# Patient Record
Sex: Female | Born: 1945
Health system: Southern US, Community
[De-identification: ages and names within clinical notes are randomized; demographics above are authoritative.]

## PROBLEM LIST (undated history)

## (undated) ENCOUNTER — Emergency Department (HOSPITAL_BASED_OUTPATIENT_CLINIC_OR_DEPARTMENT_OTHER): Admission: EM | Source: Home / Self Care

## (undated) DIAGNOSIS — I8290 Acute embolism and thrombosis of unspecified vein: Secondary | ICD-10-CM

## (undated) DIAGNOSIS — N189 Chronic kidney disease, unspecified: Secondary | ICD-10-CM

## (undated) DIAGNOSIS — I82409 Acute embolism and thrombosis of unspecified deep veins of unspecified lower extremity: Secondary | ICD-10-CM

## (undated) DIAGNOSIS — E785 Hyperlipidemia, unspecified: Secondary | ICD-10-CM

## (undated) DIAGNOSIS — M1990 Unspecified osteoarthritis, unspecified site: Secondary | ICD-10-CM

## (undated) DIAGNOSIS — D6851 Activated protein C resistance: Secondary | ICD-10-CM

## (undated) DIAGNOSIS — G43909 Migraine, unspecified, not intractable, without status migrainosus: Secondary | ICD-10-CM

## (undated) DIAGNOSIS — R51 Headache: Secondary | ICD-10-CM

## (undated) DIAGNOSIS — C50919 Malignant neoplasm of unspecified site of unspecified female breast: Secondary | ICD-10-CM

## (undated) DIAGNOSIS — K219 Gastro-esophageal reflux disease without esophagitis: Secondary | ICD-10-CM

## (undated) DIAGNOSIS — M810 Age-related osteoporosis without current pathological fracture: Secondary | ICD-10-CM

## (undated) DIAGNOSIS — C801 Malignant (primary) neoplasm, unspecified: Secondary | ICD-10-CM

## (undated) DIAGNOSIS — H53143 Visual discomfort, bilateral: Secondary | ICD-10-CM

## (undated) DIAGNOSIS — G4700 Insomnia, unspecified: Secondary | ICD-10-CM

## (undated) HISTORY — PX: EYE SURGERY: SHX253

## (undated) HISTORY — DX: Chronic kidney disease, unspecified: N18.9

## (undated) HISTORY — DX: Insomnia, unspecified: G47.00

## (undated) HISTORY — DX: Acute embolism and thrombosis of unspecified deep veins of unspecified lower extremity: I82.409

## (undated) HISTORY — DX: Activated protein C resistance: D68.51

## (undated) HISTORY — DX: Migraine, unspecified, not intractable, without status migrainosus: G43.909

## (undated) HISTORY — PX: SHOULDER SURGERY: SHX246

## (undated) HISTORY — PX: TUBAL LIGATION: SHX77

## (undated) HISTORY — PX: KNEE SURGERY: SHX244

## (undated) HISTORY — DX: Age-related osteoporosis without current pathological fracture: M81.0

## (undated) HISTORY — DX: Hyperlipidemia, unspecified: E78.5

## (undated) HISTORY — DX: Gastro-esophageal reflux disease without esophagitis: K21.9

## (undated) HISTORY — PX: TONSILLECTOMY: SUR1361

---

## 1985-07-28 HISTORY — PX: BREAST SURGERY: SHX581

## 1989-03-28 DIAGNOSIS — I82409 Acute embolism and thrombosis of unspecified deep veins of unspecified lower extremity: Secondary | ICD-10-CM

## 1989-03-28 HISTORY — DX: Acute embolism and thrombosis of unspecified deep veins of unspecified lower extremity: I82.409

## 1994-07-28 DIAGNOSIS — I8290 Acute embolism and thrombosis of unspecified vein: Secondary | ICD-10-CM

## 1994-07-28 HISTORY — DX: Acute embolism and thrombosis of unspecified vein: I82.90

## 1999-11-27 ENCOUNTER — Other Ambulatory Visit: Admission: RE | Admit: 1999-11-27 | Discharge: 1999-11-27 | Payer: Self-pay | Admitting: Gynecology

## 2000-11-27 ENCOUNTER — Other Ambulatory Visit: Admission: RE | Admit: 2000-11-27 | Discharge: 2000-11-27 | Payer: Self-pay | Admitting: Gynecology

## 2001-07-09 ENCOUNTER — Encounter: Admission: RE | Admit: 2001-07-09 | Discharge: 2001-07-09 | Payer: Self-pay | Admitting: Neurosurgery

## 2001-08-12 ENCOUNTER — Encounter: Admission: RE | Admit: 2001-08-12 | Discharge: 2001-11-10 | Payer: Self-pay

## 2001-12-08 ENCOUNTER — Other Ambulatory Visit: Admission: RE | Admit: 2001-12-08 | Discharge: 2001-12-08 | Payer: Self-pay | Admitting: Gynecology

## 2002-01-13 ENCOUNTER — Encounter: Admission: RE | Admit: 2002-01-13 | Discharge: 2002-04-13 | Payer: Self-pay

## 2002-07-28 DIAGNOSIS — C50919 Malignant neoplasm of unspecified site of unspecified female breast: Secondary | ICD-10-CM

## 2002-07-28 HISTORY — DX: Malignant neoplasm of unspecified site of unspecified female breast: C50.919

## 2002-07-28 HISTORY — PX: MASTECTOMY: SHX3

## 2002-12-16 ENCOUNTER — Other Ambulatory Visit: Admission: RE | Admit: 2002-12-16 | Discharge: 2002-12-16 | Payer: Self-pay | Admitting: Gynecology

## 2003-02-15 ENCOUNTER — Encounter: Admission: RE | Admit: 2003-02-15 | Discharge: 2003-02-15 | Payer: Self-pay | Admitting: General Surgery

## 2003-02-15 ENCOUNTER — Encounter: Payer: Self-pay | Admitting: General Surgery

## 2003-02-15 ENCOUNTER — Encounter (INDEPENDENT_AMBULATORY_CARE_PROVIDER_SITE_OTHER): Payer: Self-pay | Admitting: *Deleted

## 2003-02-17 ENCOUNTER — Encounter: Admission: RE | Admit: 2003-02-17 | Discharge: 2003-02-17 | Payer: Self-pay | Admitting: General Surgery

## 2003-02-17 ENCOUNTER — Encounter: Payer: Self-pay | Admitting: General Surgery

## 2003-02-21 ENCOUNTER — Encounter: Payer: Self-pay | Admitting: General Surgery

## 2003-02-21 ENCOUNTER — Ambulatory Visit (HOSPITAL_BASED_OUTPATIENT_CLINIC_OR_DEPARTMENT_OTHER): Admission: RE | Admit: 2003-02-21 | Discharge: 2003-02-22 | Payer: Self-pay | Admitting: General Surgery

## 2003-02-21 ENCOUNTER — Encounter: Admission: RE | Admit: 2003-02-21 | Discharge: 2003-02-21 | Payer: Self-pay | Admitting: General Surgery

## 2003-02-21 ENCOUNTER — Encounter (INDEPENDENT_AMBULATORY_CARE_PROVIDER_SITE_OTHER): Payer: Self-pay

## 2003-02-21 HISTORY — PX: BREAST LUMPECTOMY: SHX2

## 2003-02-21 HISTORY — PX: SENTINEL LYMPH NODE BIOPSY: SHX2392

## 2003-03-02 ENCOUNTER — Ambulatory Visit: Admission: RE | Admit: 2003-03-02 | Discharge: 2003-05-31 | Payer: Self-pay | Admitting: Radiation Oncology

## 2003-06-26 ENCOUNTER — Ambulatory Visit: Admission: RE | Admit: 2003-06-26 | Discharge: 2003-06-26 | Payer: Self-pay | Admitting: Radiation Oncology

## 2003-11-27 ENCOUNTER — Ambulatory Visit: Admission: RE | Admit: 2003-11-27 | Discharge: 2003-11-27 | Payer: Self-pay | Admitting: Radiation Oncology

## 2003-11-28 ENCOUNTER — Ambulatory Visit: Admission: RE | Admit: 2003-11-28 | Discharge: 2003-11-28 | Payer: Self-pay | Admitting: Radiation Oncology

## 2003-12-05 ENCOUNTER — Ambulatory Visit: Admission: RE | Admit: 2003-12-05 | Discharge: 2003-12-05 | Payer: Self-pay | Admitting: Radiation Oncology

## 2004-02-05 ENCOUNTER — Other Ambulatory Visit: Admission: RE | Admit: 2004-02-05 | Discharge: 2004-02-05 | Payer: Self-pay | Admitting: Gynecology

## 2004-06-11 ENCOUNTER — Ambulatory Visit: Admission: RE | Admit: 2004-06-11 | Discharge: 2004-06-11 | Payer: Self-pay | Admitting: Radiation Oncology

## 2004-06-24 ENCOUNTER — Ambulatory Visit: Admission: RE | Admit: 2004-06-24 | Discharge: 2004-06-24 | Payer: Self-pay | Admitting: Radiation Oncology

## 2004-11-22 ENCOUNTER — Ambulatory Visit: Payer: Self-pay | Admitting: Oncology

## 2005-02-24 ENCOUNTER — Other Ambulatory Visit: Admission: RE | Admit: 2005-02-24 | Discharge: 2005-02-24 | Payer: Self-pay | Admitting: Gynecology

## 2005-05-16 ENCOUNTER — Ambulatory Visit: Payer: Self-pay | Admitting: Oncology

## 2005-10-24 ENCOUNTER — Ambulatory Visit: Payer: Self-pay | Admitting: Oncology

## 2005-11-03 LAB — COMPREHENSIVE METABOLIC PANEL
BUN: 12 mg/dL (ref 6–23)
CO2: 29 mEq/L (ref 19–32)
Creatinine, Ser: 0.8 mg/dL (ref 0.4–1.2)
Glucose, Bld: 93 mg/dL (ref 70–99)
Total Bilirubin: 0.4 mg/dL (ref 0.3–1.2)

## 2005-11-03 LAB — CBC WITH DIFFERENTIAL/PLATELET
Eosinophils Absolute: 0.2 10*3/uL (ref 0.0–0.5)
HCT: 36.1 % (ref 34.8–46.6)
LYMPH%: 27 % (ref 14.0–48.0)
MCV: 95.3 fL (ref 81.0–101.0)
MONO#: 0.5 10*3/uL (ref 0.1–0.9)
MONO%: 6.7 % (ref 0.0–13.0)
NEUT#: 4.3 10*3/uL (ref 1.5–6.5)
NEUT%: 62.1 % (ref 39.6–76.8)
Platelets: 318 10*3/uL (ref 145–400)
WBC: 6.9 10*3/uL (ref 3.9–10.0)

## 2005-11-03 LAB — CANCER ANTIGEN 27.29: CA 27.29: 17 U/mL (ref 0–39)

## 2005-11-10 ENCOUNTER — Ambulatory Visit (HOSPITAL_COMMUNITY): Admission: RE | Admit: 2005-11-10 | Discharge: 2005-11-10 | Payer: Self-pay | Admitting: Oncology

## 2006-02-24 ENCOUNTER — Emergency Department (HOSPITAL_COMMUNITY): Admission: EM | Admit: 2006-02-24 | Discharge: 2006-02-24 | Payer: Self-pay | Admitting: Emergency Medicine

## 2006-03-23 ENCOUNTER — Other Ambulatory Visit: Admission: RE | Admit: 2006-03-23 | Discharge: 2006-03-23 | Payer: Self-pay | Admitting: Gynecology

## 2006-05-07 ENCOUNTER — Ambulatory Visit: Payer: Self-pay | Admitting: Oncology

## 2006-10-26 ENCOUNTER — Ambulatory Visit: Payer: Self-pay | Admitting: Oncology

## 2006-10-26 LAB — CBC WITH DIFFERENTIAL/PLATELET
BASO%: 0.4 % (ref 0.0–2.0)
Basophils Absolute: 0 10*3/uL (ref 0.0–0.1)
Eosinophils Absolute: 0.2 10*3/uL (ref 0.0–0.5)
HCT: 36.5 % (ref 34.8–46.6)
HGB: 12.6 g/dL (ref 11.6–15.9)
MCHC: 34.6 g/dL (ref 32.0–36.0)
MONO#: 0.6 10*3/uL (ref 0.1–0.9)
NEUT#: 5.3 10*3/uL (ref 1.5–6.5)
NEUT%: 66 % (ref 39.6–76.8)
WBC: 8.1 10*3/uL (ref 3.9–10.0)
lymph#: 1.9 10*3/uL (ref 0.9–3.3)

## 2006-10-26 LAB — COMPREHENSIVE METABOLIC PANEL
ALT: 21 U/L (ref 0–35)
CO2: 27 mEq/L (ref 19–32)
Calcium: 9.4 mg/dL (ref 8.4–10.5)
Chloride: 104 mEq/L (ref 96–112)
Creatinine, Ser: 0.88 mg/dL (ref 0.40–1.20)

## 2006-10-26 LAB — CANCER ANTIGEN 27.29: CA 27.29: 19 U/mL (ref 0–39)

## 2006-10-26 LAB — LACTATE DEHYDROGENASE: LDH: 202 U/L (ref 94–250)

## 2006-12-07 ENCOUNTER — Encounter: Admission: RE | Admit: 2006-12-07 | Discharge: 2006-12-07 | Payer: Self-pay | Admitting: Oncology

## 2007-05-06 ENCOUNTER — Ambulatory Visit: Payer: Self-pay | Admitting: Oncology

## 2007-05-10 LAB — CBC WITH DIFFERENTIAL/PLATELET
BASO%: 0.5 % (ref 0.0–2.0)
EOS%: 2.9 % (ref 0.0–7.0)
HCT: 37.3 % (ref 34.8–46.6)
MCH: 31.2 pg (ref 26.0–34.0)
MCHC: 34.9 g/dL (ref 32.0–36.0)
MONO#: 0.6 10*3/uL (ref 0.1–0.9)
NEUT%: 66.6 % (ref 39.6–76.8)
RBC: 4.17 10*6/uL (ref 3.70–5.32)
RDW: 13.5 % (ref 11.3–14.5)
WBC: 7.4 10*3/uL (ref 3.9–10.0)
lymph#: 1.7 10*3/uL (ref 0.9–3.3)

## 2007-05-10 LAB — COMPREHENSIVE METABOLIC PANEL
ALT: 20 U/L (ref 0–35)
AST: 22 U/L (ref 0–37)
Albumin: 4.7 g/dL (ref 3.5–5.2)
CO2: 24 mEq/L (ref 19–32)
Calcium: 9.3 mg/dL (ref 8.4–10.5)
Chloride: 104 mEq/L (ref 96–112)
Potassium: 4.1 mEq/L (ref 3.5–5.3)
Sodium: 141 mEq/L (ref 135–145)
Total Protein: 7.2 g/dL (ref 6.0–8.3)

## 2007-05-10 LAB — CANCER ANTIGEN 27.29: CA 27.29: 18 U/mL (ref 0–39)

## 2007-08-04 IMAGING — CT CT HEAD WO/W CM
1 of 2 series · 13 of 30 positions shown, 17 images · IV contrast (omnipaque)
Comparison: none

CLINICAL DATA: 59-year-old female with breast cancer and headache.
 HEAD CT WITHOUT AND WITH CONTRAST:
TECHNIQUE: Contiguous axial images were obtained from the base of the skull through the vertex according to standard protocol before and after administration of intravenous contrast.
 Contrast:  100 ml Omnipaque 300.

[Series 2: head_seq 4.5 h42s st · axial · 0.43mm/px · z∈[+1334,+1451]mm · 13 of 32 slices shown, 17 images]
[im 3/32  brain]
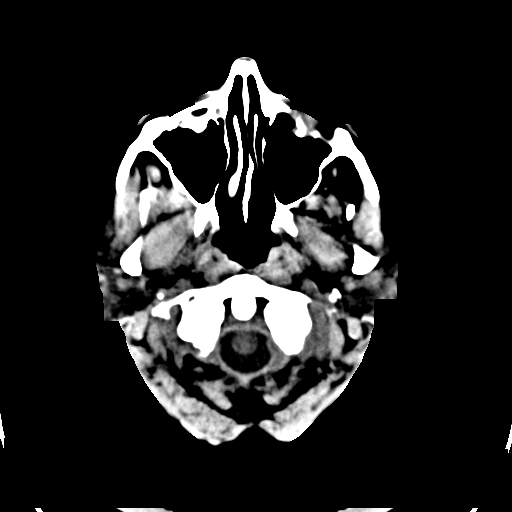
[im 3/32  bone]
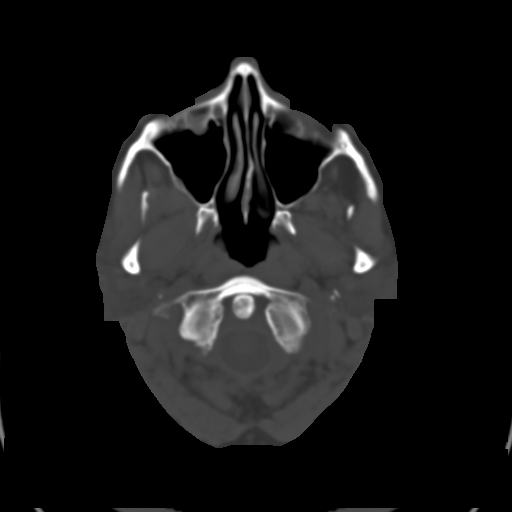
[im 5/32  brain]
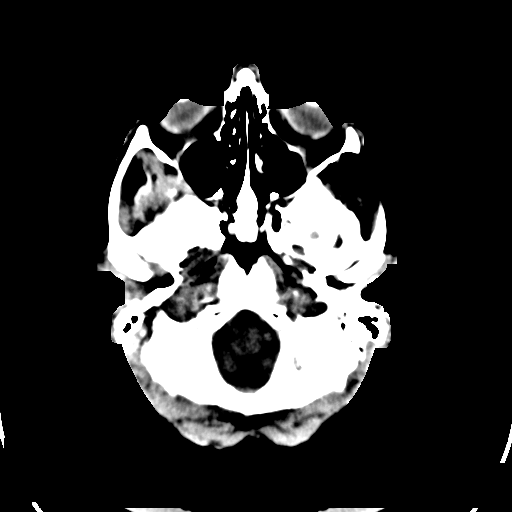
[im 7/32  brain]
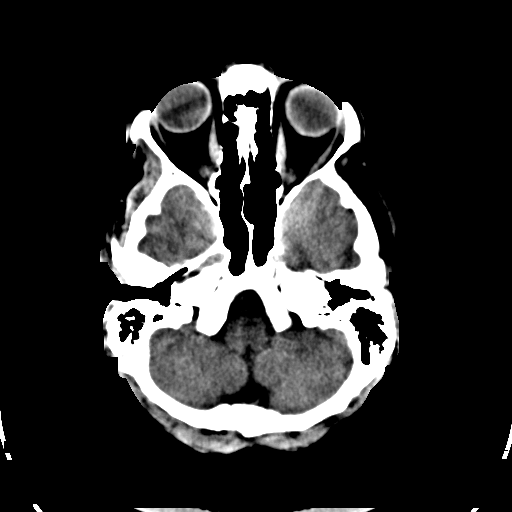
[im 9/32  brain]
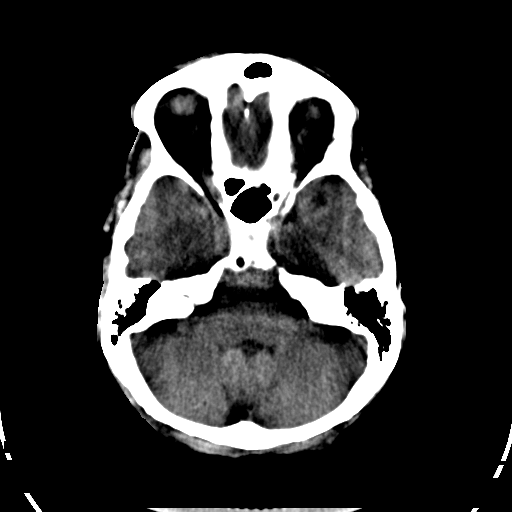
[im 12/32  brain]
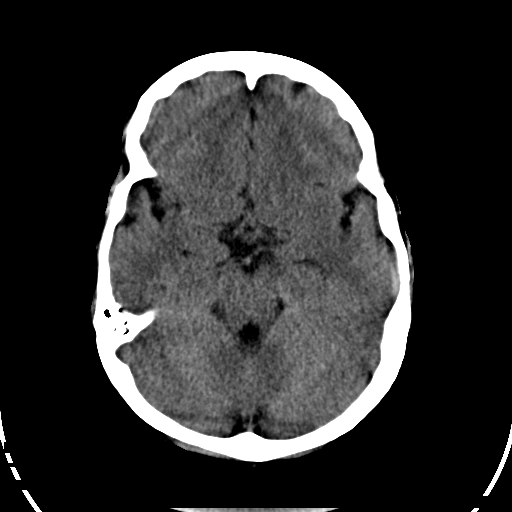
[im 12/32  bone]
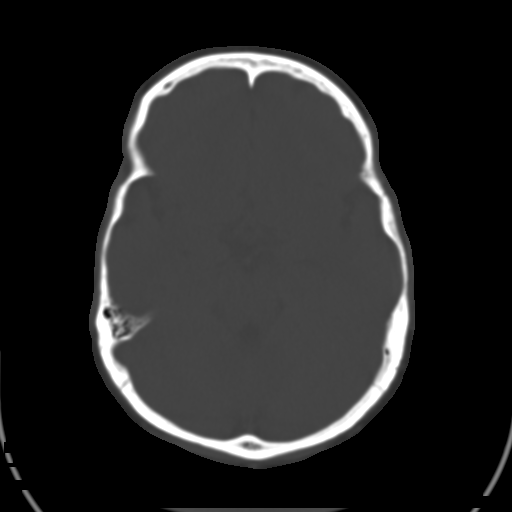
[im 14/32  brain]
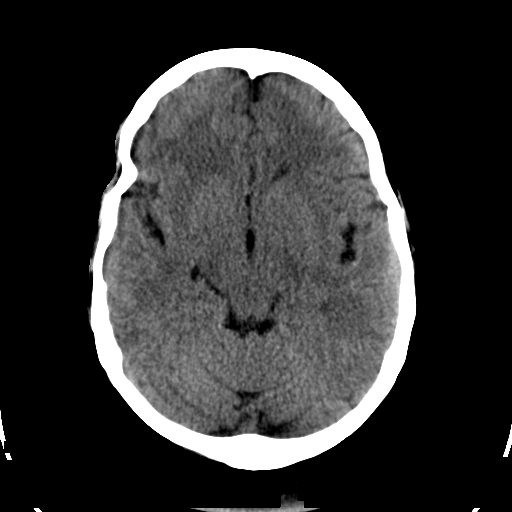
[im 16/32  brain]
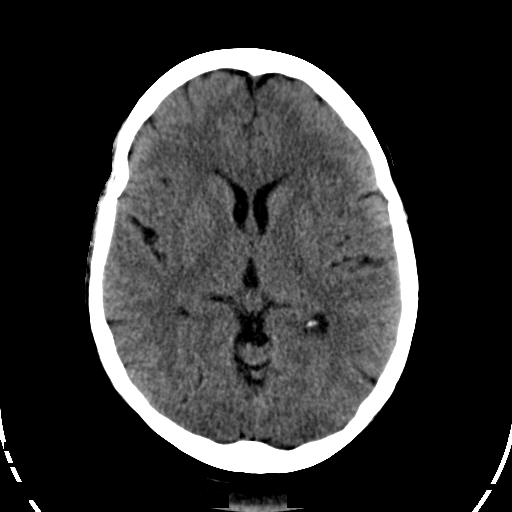
[im 18/32  brain]
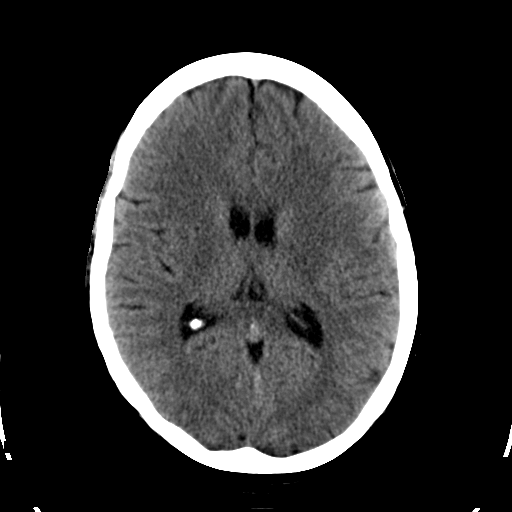
[im 20/32  brain]
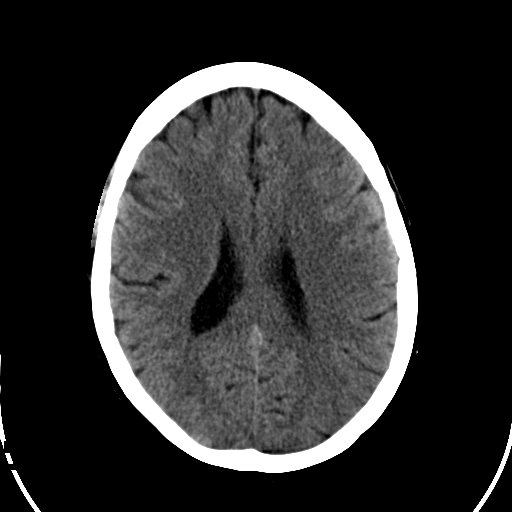
[im 20/32  bone]
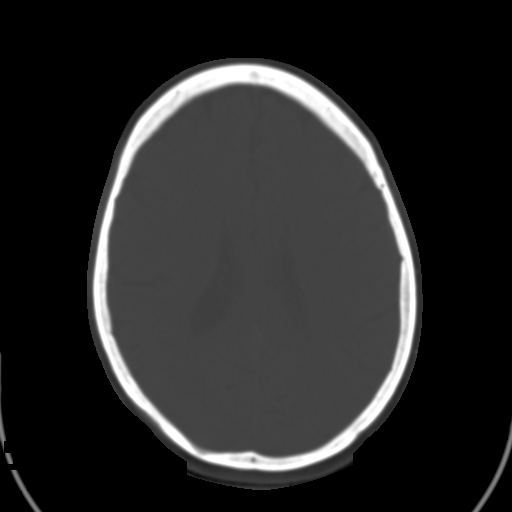
[im 23/32  brain]
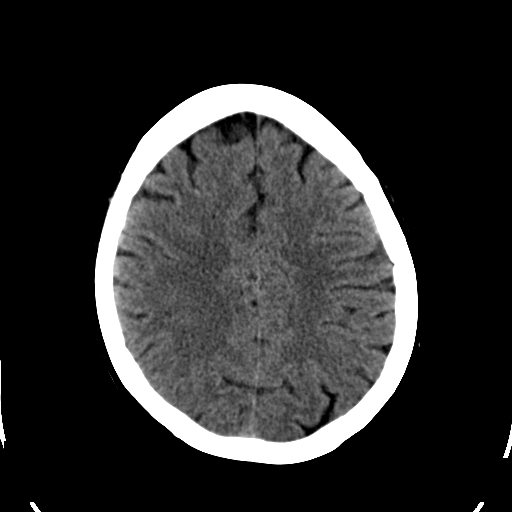
[im 25/32  brain]
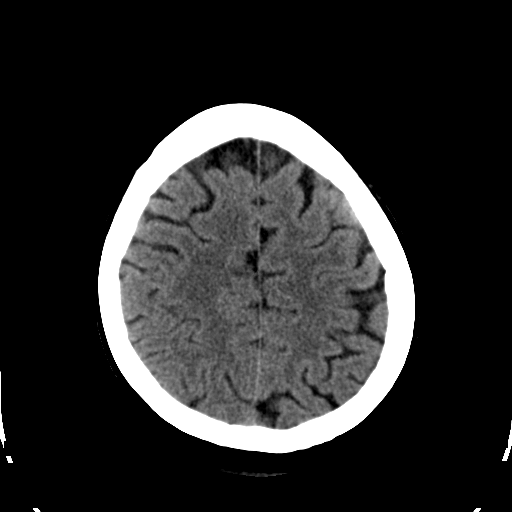
[im 27/32  brain]
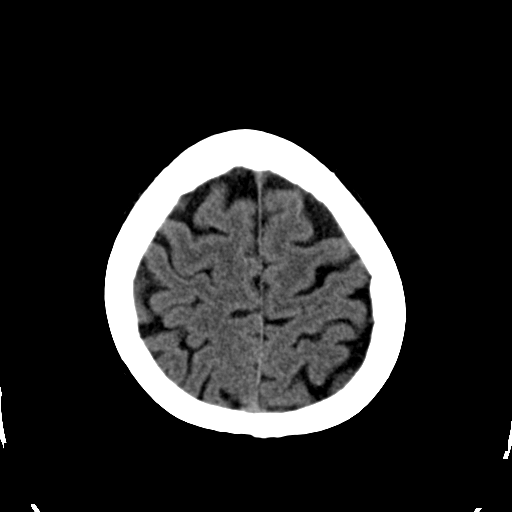
[im 29/32  brain]
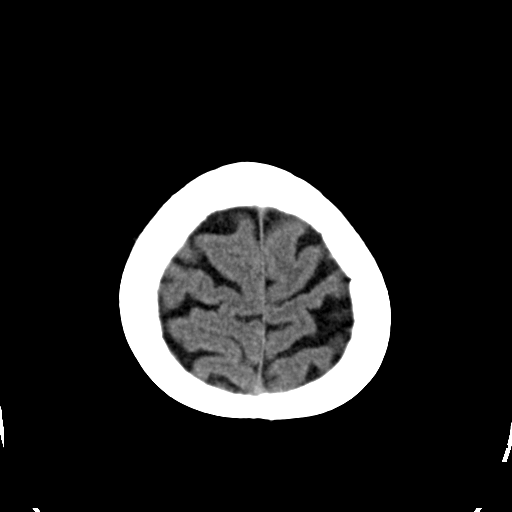
[im 29/32  bone]
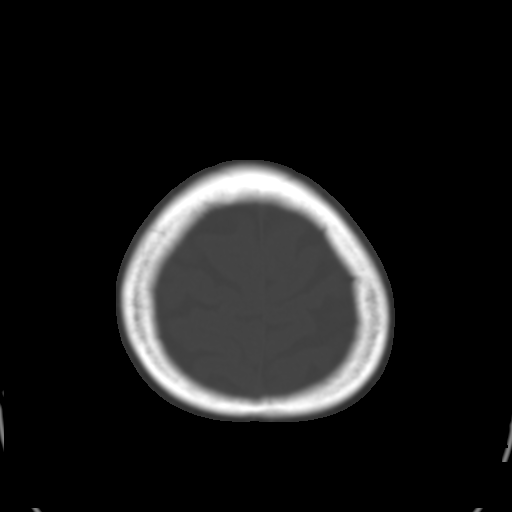

[13 of 30 positions shown; findings below may reference images not displayed]

FINDINGS: No acute intracranial abnormality is present.  Specifically, there is no evidence for acute infarct, hemorrhage, mass, hydrocephalus, or extra-axial fluid collection.  Postcontrast images demonstrate no areas of pathologic enhancement.
 A polyp or mucous retention cyst is noted within the right maxillary sinus measuring 5 mm.  The remainder of the paranasal sinuses and mastoid air cells are clear.  The osseous skull is intact.
IMPRESSION: 1.  No acute intracranial abnormality.
 2.  No evidence for metastatic disease to the brain.  If clinical concern persists, MRI of the brain without and with contrast would be more sensitive and specific for evaluation of metastatic disease.

## 2007-10-26 ENCOUNTER — Ambulatory Visit (HOSPITAL_BASED_OUTPATIENT_CLINIC_OR_DEPARTMENT_OTHER): Admission: RE | Admit: 2007-10-26 | Discharge: 2007-10-26 | Payer: Self-pay | Admitting: Orthopaedic Surgery

## 2007-11-03 ENCOUNTER — Ambulatory Visit: Payer: Self-pay | Admitting: Oncology

## 2007-11-08 LAB — CBC WITH DIFFERENTIAL/PLATELET
Eosinophils Absolute: 0.3 10*3/uL (ref 0.0–0.5)
LYMPH%: 21.7 % (ref 14.0–48.0)
MCV: 91.4 fL (ref 81.0–101.0)
MONO%: 6.9 % (ref 0.0–13.0)
NEUT#: 7 10*3/uL — ABNORMAL HIGH (ref 1.5–6.5)
Platelets: 288 10*3/uL (ref 145–400)
RBC: 3.96 10*6/uL (ref 3.70–5.32)

## 2007-11-08 LAB — COMPREHENSIVE METABOLIC PANEL
Alkaline Phosphatase: 57 U/L (ref 39–117)
BUN: 13 mg/dL (ref 6–23)
Glucose, Bld: 87 mg/dL (ref 70–99)
Sodium: 140 mEq/L (ref 135–145)
Total Bilirubin: 0.3 mg/dL (ref 0.3–1.2)
Total Protein: 7.2 g/dL (ref 6.0–8.3)

## 2007-11-08 LAB — CANCER ANTIGEN 27.29: CA 27.29: 18 U/mL (ref 0–39)

## 2008-05-04 ENCOUNTER — Ambulatory Visit: Payer: Self-pay | Admitting: Oncology

## 2008-05-08 LAB — CBC WITH DIFFERENTIAL/PLATELET
Eosinophils Absolute: 0.5 10*3/uL (ref 0.0–0.5)
MCV: 91.3 fL (ref 81.0–101.0)
MONO#: 0.6 10*3/uL (ref 0.1–0.9)
MONO%: 8.2 % (ref 0.0–13.0)
NEUT#: 4.2 10*3/uL (ref 1.5–6.5)
RBC: 4 10*6/uL (ref 3.70–5.32)
RDW: 14 % (ref 11.3–14.5)
WBC: 7.2 10*3/uL (ref 3.9–10.0)

## 2008-05-09 LAB — COMPREHENSIVE METABOLIC PANEL
ALT: 18 U/L (ref 0–35)
AST: 19 U/L (ref 0–37)
Albumin: 4.7 g/dL (ref 3.5–5.2)
Alkaline Phosphatase: 62 U/L (ref 39–117)
Glucose, Bld: 109 mg/dL — ABNORMAL HIGH (ref 70–99)
Potassium: 4.3 mEq/L (ref 3.5–5.3)
Sodium: 140 mEq/L (ref 135–145)
Total Protein: 7.1 g/dL (ref 6.0–8.3)

## 2008-05-09 LAB — LIPID PANEL
Cholesterol: 179 mg/dL (ref 0–200)
HDL: 55 mg/dL (ref >39)
Total CHOL/HDL Ratio: 3.3 Ratio
Triglycerides: 150 mg/dL — ABNORMAL HIGH (ref <150)

## 2008-05-09 LAB — VITAMIN D 25 HYDROXY (VIT D DEFICIENCY, FRACTURES): Vit D, 25-Hydroxy: 46 ng/mL (ref 30–89)

## 2008-11-13 ENCOUNTER — Ambulatory Visit: Payer: Self-pay | Admitting: Oncology

## 2008-11-15 LAB — COMPREHENSIVE METABOLIC PANEL
ALT: 14 U/L (ref 0–35)
AST: 19 U/L (ref 0–37)
Albumin: 4.5 g/dL (ref 3.5–5.2)
Calcium: 9.3 mg/dL (ref 8.4–10.5)
Chloride: 104 mEq/L (ref 96–112)
Potassium: 4.2 mEq/L (ref 3.5–5.3)
Sodium: 140 mEq/L (ref 135–145)
Total Protein: 6.9 g/dL (ref 6.0–8.3)

## 2008-11-15 LAB — CBC WITH DIFFERENTIAL/PLATELET
BASO%: 0.3 % (ref 0.0–2.0)
Basophils Absolute: 0 10*3/uL (ref 0.0–0.1)
EOS%: 4 % (ref 0.0–7.0)
HGB: 11.5 g/dL — ABNORMAL LOW (ref 11.6–15.9)
MCH: 29.1 pg (ref 25.1–34.0)
RDW: 14.6 % — ABNORMAL HIGH (ref 11.2–14.5)
lymph#: 1.9 10*3/uL (ref 0.9–3.3)

## 2008-11-15 LAB — LACTATE DEHYDROGENASE: LDH: 189 U/L (ref 94–250)

## 2009-05-28 ENCOUNTER — Ambulatory Visit: Payer: Self-pay | Admitting: Oncology

## 2009-05-30 LAB — CBC WITH DIFFERENTIAL/PLATELET
HGB: 11.7 g/dL (ref 11.6–15.9)
LYMPH%: 25 % (ref 14.0–49.7)
MCV: 89.7 fL (ref 79.5–101.0)
MONO%: 8.6 % (ref 0.0–14.0)
NEUT#: 5.1 10*3/uL (ref 1.5–6.5)
Platelets: 271 10*3/uL (ref 145–400)
RBC: 3.91 10*6/uL (ref 3.70–5.45)
RDW: 14.6 % — ABNORMAL HIGH (ref 11.2–14.5)
lymph#: 2.1 10*3/uL (ref 0.9–3.3)

## 2009-05-30 LAB — COMPREHENSIVE METABOLIC PANEL
Albumin: 3.9 g/dL (ref 3.5–5.2)
Alkaline Phosphatase: 71 U/L (ref 39–117)
BUN: 6 mg/dL (ref 6–23)
Glucose, Bld: 78 mg/dL (ref 70–99)
Potassium: 3.5 mEq/L (ref 3.5–5.3)
Total Bilirubin: 0.6 mg/dL (ref 0.3–1.2)

## 2009-05-31 LAB — VITAMIN D 25 HYDROXY (VIT D DEFICIENCY, FRACTURES): Vit D, 25-Hydroxy: 46 ng/mL (ref 30–89)

## 2009-05-31 LAB — CANCER ANTIGEN 27.29: CA 27.29: 16 U/mL (ref 0–39)

## 2010-03-28 HISTORY — PX: SHOULDER SURGERY: SHX246

## 2010-04-09 ENCOUNTER — Ambulatory Visit (HOSPITAL_BASED_OUTPATIENT_CLINIC_OR_DEPARTMENT_OTHER): Admission: RE | Admit: 2010-04-09 | Discharge: 2010-04-09 | Payer: Self-pay | Admitting: Orthopaedic Surgery

## 2010-05-23 ENCOUNTER — Ambulatory Visit: Payer: Self-pay | Admitting: Oncology

## 2010-05-27 LAB — CBC WITH DIFFERENTIAL/PLATELET
BASO%: 0.9 % (ref 0.0–2.0)
Basophils Absolute: 0.1 10*3/uL (ref 0.0–0.1)
Eosinophils Absolute: 0.3 10*3/uL (ref 0.0–0.5)
HCT: 35 % (ref 34.8–46.6)
LYMPH%: 25.3 % (ref 14.0–49.7)
MCH: 27.5 pg (ref 25.1–34.0)
MONO#: 0.6 10*3/uL (ref 0.1–0.9)
MONO%: 9.1 % (ref 0.0–14.0)
WBC: 6.7 10*3/uL (ref 3.9–10.3)
nRBC: 0 % (ref 0–0)

## 2010-05-27 LAB — COMPREHENSIVE METABOLIC PANEL
AST: 23 U/L (ref 0–37)
Albumin: 4.3 g/dL (ref 3.5–5.2)
Alkaline Phosphatase: 72 U/L (ref 39–117)
BUN: 14 mg/dL (ref 6–23)
Calcium: 9.5 mg/dL (ref 8.4–10.5)
Glucose, Bld: 101 mg/dL — ABNORMAL HIGH (ref 70–99)
Potassium: 4.4 mEq/L (ref 3.5–5.3)
Sodium: 141 mEq/L (ref 135–145)
Total Bilirubin: 0.3 mg/dL (ref 0.3–1.2)

## 2010-05-27 LAB — LIPID PANEL
Cholesterol: 150 mg/dL (ref 0–200)
HDL: 50 mg/dL (ref >39)
LDL Cholesterol: 87 mg/dL (ref 0–99)
Triglycerides: 63 mg/dL (ref <150)
VLDL: 13 mg/dL (ref 0–40)

## 2010-10-02 NOTE — H&P (Addendum)
  NAME:  Samantha Ali, Samantha Ali                 ACCOUNT NO.:  [id number]  MEDICAL RECORD NO.:  [id number]           PATIENT TYPE:  LOCATION:                                 FACILITY:  PHYSICIAN:  Lubertha Basque. Denelda Akerley, M.D.DATE OF BIRTH:  Dec 11, 1945  DATE OF ADMISSION:  04/09/2010 DATE OF DISCHARGE:                             HISTORY & PHYSICAL   CHIEF COMPLAINT:  Right shoulder pain.  HISTORY OF PRESENT ILLNESS:  This is a 65 year old female with a long- standing history right shoulder pain persistent despite oral anti- inflammatory medications and exercise.  Pain limits her ability at rest, keeps for awake at nighttime.  She does have impingement and a possible rotator cuff tear.  We have talked to her about the risks of arthroscopy and proceeding on with that.  No significant past medical history.  SOCIAL HISTORY:  She does not smoke, does not drink.  Hemoglobin 12.3.  EKG was done preoperatively which was within normal limits.  Family history is negative for diabetes, hypertension, heart disease.  Fourteen-point review of systems is positive for glasses, otherwise essentially negative.  PHYSICAL EXAMINATION:  Blood pressure 132/86, heart rate of 76.  Well developed, well nourished, in no apparent acute distress.  Lungs are clear.  Eyes, PERRLA.  Visual fields are normal.  Mouth, unobstructed. Full cervical motion. No carotid bruits noted.  Full upper extremity reflexes 2+ biceps, triceps, brachioradialis, but she has marked positive right shoulder impingement.  She has a painful arc and minimal pain at the [facility].  Good biceps and triceps strength with some discomfort with testing on the biceps musculature at the long head of the biceps.  Deltoid muscle appears normal.  Elbow motion full without sign of limitation.  Wrist unremarkable.  Skin intact.  No adenopathy noted.  Lower extremity examination is unremarkable.  Her abdomen has positive bowel sounds.  Negative guarding.  No  spleen or liver enlargement.  No masses or tenderness noted.  Lower extremity examination, good pulses, good neurovascular status.  No peripheral edema noted.  ASSESSMENT:  Right shoulder impingement, biceps degeneration, and rotator cuff tear.  PLAN:  At this point, she was planned on going to the operating room for the above-mentioned procedures for shoulder and then stable to be released home and then follow up in our office in 7-10 days.     Lindwood Qua, P.A.   ______________________________ Lubertha Basque. Jerl Santos, M.D.    MC/MEDQ  D:  09/19/2010  T:  09/20/2010  Job:  [id number]  Electronically Signed by Lindwood Qua P.A. on 09/20/2010 11:56:32 AM Electronically Signed by Marcene Corning M.D. on 10/01/2010 03:46:51 PM Electronically Signed by Marcene Corning M.D. on 10/01/2010 04:06:49 PM Electronically Signed by Marcene Corning M.D. on 10/01/2010 04:38:08 PM Electronically Signed by Marcene Corning M.D. on 10/01/2010 05:09:44 PM Electronically Signed by Marcene Corning M.D. on 10/01/2010 05:09:44 PM Electronically Signed by Marcene Corning M.D. on 10/01/2010 05:38:41 PM Electronically Signed by Marcene Corning M.D. on 10/01/2010 06:27:45 PM Electronically Signed by Marcene Corning M.D. on 10/01/2010 07:36:57 PM

## 2010-12-10 NOTE — Op Note (Signed)
NAME:  Samantha Ali, Samantha Ali                 ACCOUNT NO.:  [id number]   MEDICAL RECORD NO.:  [id number]          PATIENT TYPE:  AMB   LOCATION:  DSC                          FACILITY:  [facility]   PHYSICIAN:  Lubertha Basque. Dalldorf, M.D.DATE OF BIRTH:  05/22/1946   DATE OF PROCEDURE:  10/26/2007  DATE OF DISCHARGE:                               OPERATIVE REPORT   PREOPERATIVE DIAGNOSES:  1. Right knee torn lateral meniscus.  2. Right knee chondromalacia patella.   POSTOPERATIVE DIAGNOSES:  1. Right knee torn lateral meniscus.  2. Right knee chondromalacia patella.   PROCEDURES:  1. Right knee partial lateral meniscectomy.  2. Right knee abrasion chondroplasty and removal loose body.   ANESTHESIA:  Knee block, MAC.   ATTENDING SURGEON:  Lubertha Basque. Jerl Santos, M.D.   ASSISTANT:  Lindwood Qua, P.A.   INDICATIONS FOR PROCEDURE:  The patient is a 61-year woman with a long  history of right knee pain and swelling.  This has persisted despite  oral anti-inflammatories and an injection.  By MRI scan, she has a  lateral meniscus tear.  She has pain which limits her ability to rest  and walk and she is offered an arthroscopy.  Informed operative consent  was obtained after discussion of possible complications of reaction to  anesthesia and infection.   SUMMARY OF FINDINGS AND PROCEDURE:  Under knee block and MAC, a right  knee arthroscopy was performed.  Suprapatellar pouch was benign while  the patellofemoral joint exhibited focal degeneration in intertrochlear  groove addressed with a chondroplasty and abrasion to bleeding bone in  one tiny area.  Medial compartment exhibited no evidence of meniscal  injury though she did have some chondromalacia grade 3 near the  intertrochlear groove addressed with a brief chondroplasty.  ACL was  intact.  In the lateral compartment, she did have an anterior horn  lateral meniscus tear addressed with about 15 or 20% partial lateral  meniscectomy removing most of  the anterior horn.  The rest of meniscus  was intact.  There were some grade II and III changes in this  compartment but definitely no exposed bone.  She did have some loose  cartilaginous pieces in the knee which were removed and one small bony  piece which was removed.   DESCRIPTION OF PROCEDURE:  The patient was taken to the operating suite  where a knee block and MAC were applied.  She is positioned supine and  prepped and draped in normal sterile fashion.  After the administration  of preoperative IV Kefzol, an arthroscopy of the right knee was  performed through a total of two portals.  Findings were as noted above  and procedure consisted of the chondroplasty medial and patellofemoral  with a brief abrasion to bleeding bone in patellofemoral.  She did have  the loose bodies which were removed.  She did have the anterior horn  lateral meniscus tear addressed with a partial lateral meniscectomy  along with basket and shaver back to stable tissues.  The knee was  thoroughly irrigated followed by placing of Marcaine with epinephrine  and morphine.  Adaptic was placed over the portals followed by dry gauze  and loose Ace wrap.  Estimated blood loss and intraoperative fluids can  be obtained from anesthesia records.   DISPOSITION:  The patient was taken to recovery in stable addition.  She  was to go home the same day and follow up in the office in less than a  week.  I will contact her by phone tonight.      Lubertha Basque Jerl Santos, M.D.  Electronically Signed     PGD/MEDQ  D:  10/26/2007  T:  10/26/2007  Job:  [id number]

## 2010-12-13 NOTE — Consult Note (Signed)
NAME:  Samantha Ali, Samantha Ali                           ACCOUNT NO.:  [id number]   MEDICAL RECORD NO.:  [id number]                   PATIENT TYPE:  REC   LOCATION:  [facility]                                  FACILITY:  [facility]   PHYSICIAN:  Sondra Come, D.O.                 DATE OF BIRTH:  12/19/1945   DATE OF CONSULTATION:  DATE OF DISCHARGE:                  PHYSICAL MEDICINE & REHABILITATION CONSULTATION   HISTORY OF PRESENT ILLNESS:  The patient returns to clinic today for  referral.  She was last seen January 14, 2002.  In the interim, she has  continued to have lower back pain.  She denies any radicular symptoms at  this time.  She discontinued all of her medications a few weeks back  secondary to  rash that broke out on her big toe.  She felt that it might be  related in some way to the medications.  At this time she is on Zanaflex and  Bextra.  Her pain is rated as 5:10 on a subjective scale.  Also, in the  interim, she has tried the Ultracet samples which she states has helped her.  The rash that she has began prior to the initiation of Ultracet.   I reviewed Health and History form and 14-point review of systems.  Patient's function and quality of life indices remain stable.  Her sleep is  good.  She continues to use the neuromuscular stimulator but has had some  technical problems with it which the company is fixing.   PHYSICAL EXAMINATION:  GENERAL:  Reveals healthy female in no acute  distress.  VITALS:  Blood pressure 132/56, pulse 81, respirations 14 and regular.  BACK:  Reveals leveled pelvis without scoliosis.  There is decreased lumbar  lordosis with minimal tenderness to palpation of bilateral lumbar and  paraspinals.  Range of motion is essentially full with pain on extension.  Muscle testing is 5/5 in the bilateral lower extremities.  SENSORY:  Intact to light touch in bilateral lower extremities.  Muscle  stretch reflexes are 2+/4 in bilateral patellar, medial hamstrings and  left  Achilles, 1+/4 right Achille's.  Straight leg raise negative in a seated  position.   IMPRESSION:  1. Chronic lower back pain, mechanical and myofascial.  2. Degenerative disk disease of the lumbar spine without myelopathy.     Patient is not having any radicular component at this time.  3. Suspect lumbar facet arthropathy.   PLAN:  1. A long discussion with the patient regarding treatment options.  At this     point, we will continue with Ultracet one to two p.o. up to four times     daily as needed #100 with two refills.  2. Continue neuromuscular stimulator.  3. Continue home exercise program and consider using exercise ball.  4. Consider resuming Bextra at some point.  Patient is somewhat reluctant     now to do so given this rash on  her toe which is essentially healed.  I     am uncertain whether or not this represents a drug reaction.  5. Patient to return to clinic in six months or sooner as needed.   Patient was educated on above findings and recommendations and understands.  There were no barriers to communication.                                                Sondra Come, D.O.    JJW/MEDQ  D:  03/11/2002  T:  03/12/2002  Job:  [id number]

## 2011-03-12 ENCOUNTER — Emergency Department (INDEPENDENT_AMBULATORY_CARE_PROVIDER_SITE_OTHER): Payer: Medicare Other

## 2011-03-12 ENCOUNTER — Encounter: Payer: Self-pay | Admitting: *Deleted

## 2011-03-12 ENCOUNTER — Emergency Department (HOSPITAL_BASED_OUTPATIENT_CLINIC_OR_DEPARTMENT_OTHER)
Admission: EM | Admit: 2011-03-12 | Discharge: 2011-03-12 | Disposition: A | Discharge status: Home / Self Care | Payer: Medicare Other | Attending: Emergency Medicine | Admitting: Emergency Medicine

## 2011-03-12 DIAGNOSIS — W1809XA Striking against other object with subsequent fall, initial encounter: Secondary | ICD-10-CM

## 2011-03-12 DIAGNOSIS — M4120 Other idiopathic scoliosis, site unspecified: Secondary | ICD-10-CM

## 2011-03-12 DIAGNOSIS — M1710 Unilateral primary osteoarthritis, unspecified knee: Secondary | ICD-10-CM

## 2011-03-12 DIAGNOSIS — M47812 Spondylosis without myelopathy or radiculopathy, cervical region: Secondary | ICD-10-CM

## 2011-03-12 DIAGNOSIS — K7689 Other specified diseases of liver: Secondary | ICD-10-CM

## 2011-03-12 DIAGNOSIS — M47817 Spondylosis without myelopathy or radiculopathy, lumbosacral region: Secondary | ICD-10-CM

## 2011-03-12 DIAGNOSIS — M545 Low back pain, unspecified: Secondary | ICD-10-CM | POA: Insufficient documentation

## 2011-03-12 DIAGNOSIS — M542 Cervicalgia: Secondary | ICD-10-CM

## 2011-03-12 DIAGNOSIS — IMO0002 Reserved for concepts with insufficient information to code with codable children: Secondary | ICD-10-CM

## 2011-03-12 DIAGNOSIS — Y9289 Other specified places as the place of occurrence of the external cause: Secondary | ICD-10-CM | POA: Insufficient documentation

## 2011-03-12 DIAGNOSIS — Z981 Arthrodesis status: Secondary | ICD-10-CM

## 2011-03-12 DIAGNOSIS — M25569 Pain in unspecified knee: Secondary | ICD-10-CM | POA: Insufficient documentation

## 2011-03-12 DIAGNOSIS — M549 Dorsalgia, unspecified: Secondary | ICD-10-CM

## 2011-03-12 DIAGNOSIS — M25469 Effusion, unspecified knee: Secondary | ICD-10-CM

## 2011-03-12 DIAGNOSIS — R079 Chest pain, unspecified: Secondary | ICD-10-CM

## 2011-03-12 DIAGNOSIS — S8391XA Sprain of unspecified site of right knee, initial encounter: Secondary | ICD-10-CM

## 2011-03-12 DIAGNOSIS — R109 Unspecified abdominal pain: Secondary | ICD-10-CM

## 2011-03-12 DIAGNOSIS — S139XXA Sprain of joints and ligaments of unspecified parts of neck, initial encounter: Secondary | ICD-10-CM | POA: Insufficient documentation

## 2011-03-12 DIAGNOSIS — M1120 Other chondrocalcinosis, unspecified site: Secondary | ICD-10-CM

## 2011-03-12 HISTORY — DX: Malignant (primary) neoplasm, unspecified: C80.1

## 2011-03-12 HISTORY — DX: Acute embolism and thrombosis of unspecified vein: I82.90

## 2011-03-12 HISTORY — DX: Malignant neoplasm of unspecified site of unspecified female breast: C50.919

## 2011-03-12 LAB — BASIC METABOLIC PANEL
CO2: 26 mEq/L (ref 19–32)
Chloride: 102 mEq/L (ref 96–112)
GFR calc non Af Amer: 56 mL/min — ABNORMAL LOW (ref >60)

## 2011-03-12 MED ORDER — HYDROCODONE-ACETAMINOPHEN 5-325 MG PO TABS: 1.0000 | ORAL_TABLET | ORAL | Status: AC | PRN

## 2011-03-12 MED ORDER — IOHEXOL 300 MG/ML  SOLN
100.0000 mL | Freq: Once | INTRAMUSCULAR | Status: AC | PRN
  Administered 2011-03-12: 100 mL via INTRAVENOUS

## 2011-03-12 NOTE — ED Notes (Signed)
Assisted pt onto and off of bedpan. Pt voided apprx. or yellow urine. Specimen collected and delivered to lab.

## 2011-03-12 NOTE — ED Provider Notes (Signed)
History     CSN: [id number] Arrival date & time: 03/12/2011  4:53 PM  Chief Complaint  Patient presents with  . Fall   HPI Pt was at work, tried to sit down but missed chair and fell, landing on buttocks and hitting left side on corner of chair.  Did not hit head. C/o pain in low back and right knee.  Attempted to ambulate but aggravated knee and back pain.  Denies SOB.    Past Medical History  Diagnosis Date  . Cancer   . Breast cancer   . Clot     Past Surgical History  Procedure Date  . Knee surgery   . Shoulder surgery     No family history on file.  History  Substance Use Topics  . Smoking status: Never Smoker   . Smokeless tobacco: Not on file  . Alcohol Use: No    OB History    Grav Para Term Preterm Abortions TAB SAB Ect Mult Living                  Review of Systems  All other systems reviewed and are negative.    Physical Exam  BP 160/70  Pulse 101  Temp(Src) 98.2 F (36.8 C) (Oral)  Resp 18  SpO2 97%  Physical Exam  Nursing note and vitals reviewed. Constitutional: She is oriented to person, place, and time. She appears well-developed and well-nourished. No distress.       Pt talking on her cell phone  HENT:  Head: Normocephalic and atraumatic.  Eyes:       Normal appearance  Neck:       C-collar  Cardiovascular: Normal rate and regular rhythm.   Pulmonary/Chest: Effort normal and breath sounds normal.       No ecchymosis or abrasion.  Tenderness of left lateral chest wall.  No pleuritic pain reported.   Abdominal: She exhibits no distension. There is no guarding.       No ecchymosis or abrasion.  Tenderness of LUQ and left side.   Musculoskeletal:       Tenderness at approx C6.  No thoracic or lumbar spine tenderness.  Pelvis stable.  Full ROM of left ankle/knee/hip w/out pain.  Nml right ankle and hip exam but right knee tender at medial patella and ROM limited by pain.  Bilateral NV intact.   Neurological: She is alert and oriented to  person, place, and time.  Skin: Skin is warm and dry. No rash noted.  Psychiatric: She has a normal mood and affect. Her behavior is normal.    ED Course  Procedures  MDM Pt had a mechanical fall onto buttocks today and c/o low back and right knee pain.  Exam significant for c-spine tenderness, right patellar tenderness w/ painful ROM and left lateral abd and chest tenderness.  She is in NAD.  She was most concerned about whether or not her blue tooth was turned off and has been on her cell phone. Trauma CT of abd as well as CXR, xray cervical/lumbar spine and right knee pending.  Pt declines anything for pain.   Xray c-spine inconclusive but other studies neg for acute pathology.  CT c-spine obtained and neg.  All results discussed w/ pt.  Discharged home w/ pain medication.  She has a knee brace and crutches at home and close relationship w/ Dr. Jerl Santos w/ [facility].  10:40 PM       Otilio Miu, [place] 03/12/11 2241

## 2011-03-12 NOTE — ED Notes (Signed)
EMS sts pt was sitting down and thought a chair was behind her. Pt fell backwards onto her buttocks and felt a pop in  Her lower back. EMS transported on LSB with c-collar in place.

## 2011-03-12 NOTE — ED Notes (Signed)
Pt was in the motion of sitting down onto a chair that was not there. Pt fell backwards onto the floor landing on her buttocks. Pt sts she felt a pop and is c/o pain in her lower back and right knee. Right knee has slight swelling.

## 2011-03-15 NOTE — ED Provider Notes (Signed)
History/physical exam/procedure(s) were performed by non-physician practitioner and as supervising physician I was immediately available for consultation/collaboration. I have reviewed all notes and am in agreement with care and plan.   Hilario Quarry, MD 03/15/11 [phone]

## 2011-05-09 ENCOUNTER — Encounter: Payer: Self-pay | Admitting: *Deleted

## 2011-05-13 ENCOUNTER — Ambulatory Visit (HOSPITAL_BASED_OUTPATIENT_CLINIC_OR_DEPARTMENT_OTHER)
Admission: RE | Admit: 2011-05-13 | Discharge: 2011-05-13 | Disposition: A | Discharge status: Home / Self Care | Payer: Medicare Other | Source: Ambulatory Visit | Attending: Orthopaedic Surgery | Admitting: Orthopaedic Surgery

## 2011-05-13 DIAGNOSIS — M23305 Other meniscus derangements, unspecified medial meniscus, unspecified knee: Secondary | ICD-10-CM | POA: Insufficient documentation

## 2011-05-13 DIAGNOSIS — Z01812 Encounter for preprocedural laboratory examination: Secondary | ICD-10-CM | POA: Insufficient documentation

## 2011-05-13 DIAGNOSIS — Z853 Personal history of malignant neoplasm of breast: Secondary | ICD-10-CM | POA: Insufficient documentation

## 2011-05-13 DIAGNOSIS — M23302 Other meniscus derangements, unspecified lateral meniscus, unspecified knee: Secondary | ICD-10-CM | POA: Insufficient documentation

## 2011-05-13 DIAGNOSIS — M2240 Chondromalacia patellae, unspecified knee: Secondary | ICD-10-CM | POA: Insufficient documentation

## 2011-05-13 DIAGNOSIS — M2340 Loose body in knee, unspecified knee: Secondary | ICD-10-CM | POA: Insufficient documentation

## 2011-05-22 NOTE — Op Note (Signed)
NAME:  Samantha Ali, Samantha Ali                 ACCOUNT NO.:  [id number]  MEDICAL RECORD NO.:  [id number]  LOCATION:                                 FACILITY:  PHYSICIAN:  Lubertha Basque. Trevor Duty, M.D.DATE OF BIRTH:  November 23, 1945  DATE OF PROCEDURE:  05/13/2011 DATE OF DISCHARGE:                              OPERATIVE REPORT   PREOPERATIVE DIAGNOSES: 1. Right knee torn medial and torn lateral meniscus. 2. Right knee chondromalacia.  POSTOPERATIVE DIAGNOSES: 1. Right knee torn medial and torn lateral meniscus. 2. Right knee chondromalacia.  PROCEDURES: 1. Right knee partial medial and partial lateral meniscectomy. 2. Right knee abrasion chondroplasty, patellofemoral and lateral.  ATTENDING SURGEON:  Lubertha Basque. Kameran Lallier, MD   INDICATION FOR PROCEDURE:  The patient is a 64 year old woman with several months of recurrent right knee pain.  She had an arthroscopy and Supartz series for some moderate degenerative change 3 years back and did well until she fell out of a chair and twisted her knee.  She has persisted with difficulty, despite oral anti-inflammatories and bracing and some therapy.  By x-ray, she does have some moderate degenerative change, but it is not bone-on-bone and our hope is that we can ameliorate things somewhat with an arthroscopy.  Informed operative consent was obtained after discussion of possible complications including reaction to anesthesia and infection.  SUMMARY OF FINDINGS AND PROCEDURE:  Under general anesthesia, an arthroscopy of the right knee was performed.  The suprapatellar pouch was benign except for some small cartilaginous loose bodies, which were removed.  The patellofemoral joint exhibited grade 3 and focal grade 4 breakdown on the undersurface of the patella, and a chondroplasty was done along with abrasion to bleeding bone in some small areas.  The medial compartment was notable for a small middle horn medial meniscus tear addressed with a tiny partial  medial meniscectomy.  There were also some cartilaginous loose bodies here.  Arthritic change was grade 2 in this compartment.  ACL was intact.  Lateral compartment did exhibit some grade 4 change in a dime-sized area of the tibial plateau with some flaps of articular cartilage, which were debrided.  She also had a more significant lateral meniscus tear and about a 10% partial lateral meniscectomy was done with a basket and shaver.  DESCRIPTION OF PROCEDURE:  The patient was taken to the operating suite where general anesthetic was applied without difficulty.  She was positioned supine and prepped and draped in normal sterile fashion. After administration of IV Kefzol, an arthroscopy of the right knee was performed through a total of 2 portals.  Findings were as noted above and procedure consisted of the partial medial and partial lateral meniscectomy done with basket and shaver.  We then performed the abrasion chondroplasty and other chondroplasties as indicated.  The knee was thoroughly irrigated followed by placement of Marcaine withepinephrine and morphine plus some Depo-Medrol.  Adaptic was placed over the portals followed by dry gauze and a loose Ace wrap.  Estimated blood loss and fluids obtained from anesthesia records.  DISPOSITION:  The patient was extubated in the operating room and taken to recovery room in stable condition.  She was to go home  same day and follow up in the office in less than a week.  I will contact her by phone tonight.     Lubertha Basque Jerl Santos, M.D.     PGD/MEDQ  D:  05/13/2011  T:  05/13/2011  Job:  [id number]  Electronically Signed by Marcene Corning M.D. on 05/22/2011 01:58:41 PM

## 2011-05-29 ENCOUNTER — Other Ambulatory Visit: Payer: Self-pay | Admitting: *Deleted

## 2011-05-29 ENCOUNTER — Other Ambulatory Visit: Payer: Self-pay | Admitting: Orthopedic Surgery

## 2011-05-29 ENCOUNTER — Ambulatory Visit
Admission: RE | Admit: 2011-05-29 | Discharge: 2011-05-29 | Disposition: A | Discharge status: Home / Self Care | Payer: Medicare Other | Source: Ambulatory Visit | Attending: Orthopedic Surgery | Admitting: Orthopedic Surgery

## 2011-05-29 DIAGNOSIS — R609 Edema, unspecified: Secondary | ICD-10-CM

## 2011-05-29 DIAGNOSIS — C50919 Malignant neoplasm of unspecified site of unspecified female breast: Secondary | ICD-10-CM | POA: Insufficient documentation

## 2011-05-30 ENCOUNTER — Inpatient Hospital Stay (HOSPITAL_COMMUNITY)
Admission: RE | Admit: 2011-05-30 | Discharge: 2011-06-02 | DRG: 487 | Disposition: A | Discharge status: Home / Self Care | Payer: Medicare Other | Source: Ambulatory Visit | Attending: Orthopaedic Surgery | Admitting: Orthopaedic Surgery

## 2011-05-30 DIAGNOSIS — Z86718 Personal history of other venous thrombosis and embolism: Secondary | ICD-10-CM

## 2011-05-30 DIAGNOSIS — Z853 Personal history of malignant neoplasm of breast: Secondary | ICD-10-CM

## 2011-05-30 DIAGNOSIS — IMO0002 Reserved for concepts with insufficient information to code with codable children: Secondary | ICD-10-CM | POA: Diagnosis present

## 2011-05-30 DIAGNOSIS — M009 Pyogenic arthritis, unspecified: Secondary | ICD-10-CM

## 2011-05-30 DIAGNOSIS — M659 Unspecified synovitis and tenosynovitis, unspecified site: Secondary | ICD-10-CM | POA: Diagnosis present

## 2011-05-30 DIAGNOSIS — K219 Gastro-esophageal reflux disease without esophagitis: Secondary | ICD-10-CM | POA: Diagnosis present

## 2011-05-30 DIAGNOSIS — M1710 Unilateral primary osteoarthritis, unspecified knee: Secondary | ICD-10-CM | POA: Diagnosis present

## 2011-05-30 DIAGNOSIS — D649 Anemia, unspecified: Secondary | ICD-10-CM | POA: Diagnosis present

## 2011-05-30 DIAGNOSIS — E785 Hyperlipidemia, unspecified: Secondary | ICD-10-CM | POA: Diagnosis present

## 2011-05-30 LAB — CBC
HCT: 30.9 % — ABNORMAL LOW (ref 36.0–46.0)
Hemoglobin: 10.3 g/dL — ABNORMAL LOW (ref 12.0–15.0)
MCH: 28.9 pg (ref 26.0–34.0)
MCHC: 33.3 g/dL (ref 30.0–36.0)
MCV: 86.6 fL (ref 78.0–100.0)
RBC: 3.57 MIL/uL — ABNORMAL LOW (ref 3.87–5.11)

## 2011-05-30 LAB — PROTIME-INR: Prothrombin Time: 12.7 seconds (ref 11.6–15.2)

## 2011-05-30 LAB — BASIC METABOLIC PANEL
BUN: 11 mg/dL (ref 6–23)
CO2: 26 mEq/L (ref 19–32)
Chloride: 102 mEq/L (ref 96–112)
GFR calc non Af Amer: 69 mL/min — ABNORMAL LOW (ref >90)
Glucose, Bld: 110 mg/dL — ABNORMAL HIGH (ref 70–99)
Potassium: 4.5 mEq/L (ref 3.5–5.1)
Sodium: 140 mEq/L (ref 135–145)

## 2011-05-30 LAB — SURGICAL PCR SCREEN
MRSA, PCR: NEGATIVE
Staphylococcus aureus: NEGATIVE

## 2011-05-31 DIAGNOSIS — M009 Pyogenic arthritis, unspecified: Secondary | ICD-10-CM

## 2011-05-31 MED ORDER — PANTOPRAZOLE SODIUM 20 MG PO TBEC
20.0000 mg | DELAYED_RELEASE_TABLET | Freq: Every day | ORAL | Status: DC
  Administered 2011-06-01 – 2011-06-02 (×3): 20 mg via ORAL
  Filled 2011-05-31 (×3): qty 1

## 2011-05-31 MED ORDER — VENLAFAXINE HCL ER 75 MG PO CP24
75.0000 mg | ORAL_CAPSULE | Freq: Every day | ORAL | Status: DC
  Administered 2011-06-01 – 2011-06-02 (×2): 75 mg via ORAL
  Filled 2011-05-31 (×3): qty 1

## 2011-05-31 MED ORDER — TRAZODONE HCL 50 MG PO TABS
25.0000 mg | ORAL_TABLET | Freq: Every day | ORAL | Status: DC
  Administered 2011-06-01: 25 mg via ORAL
  Filled 2011-05-31 (×3): qty 0.5

## 2011-05-31 MED ORDER — PANTOPRAZOLE SODIUM 40 MG PO TBEC
40.0000 mg | DELAYED_RELEASE_TABLET | Freq: Every day | ORAL | Status: DC
  Administered 2011-06-01: 40 mg via ORAL

## 2011-05-31 MED ORDER — SIMVASTATIN 20 MG PO TABS
20.0000 mg | ORAL_TABLET | Freq: Every day | ORAL | Status: DC
  Administered 2011-06-01 – 2011-06-02 (×2): 20 mg via ORAL
  Filled 2011-05-31 (×3): qty 1

## 2011-05-31 MED ORDER — VITAMIN D3 25 MCG (1000 UNIT) PO TABS
1000.0000 [IU] | ORAL_TABLET | Freq: Two times a day (BID) | ORAL | Status: DC
  Administered 2011-06-01 – 2011-06-02 (×3): 1000 [IU] via ORAL
  Filled 2011-05-31 (×7): qty 1

## 2011-05-31 MED ORDER — STUDY - INVESTIGATIONAL MEDICATION
1.0000 | Freq: Every day | Status: DC
  Administered 2011-06-02: 1 via ORAL

## 2011-05-31 MED ORDER — HYDROCODONE-ACETAMINOPHEN 5-325 MG PO TABS
1.0000 | ORAL_TABLET | Freq: Four times a day (QID) | ORAL | Status: DC | PRN
  Administered 2011-06-01 (×2): 1 via ORAL

## 2011-05-31 MED ORDER — ASPIRIN EC 81 MG PO TBEC
81.0000 mg | DELAYED_RELEASE_TABLET | Freq: Every day | ORAL | Status: DC
  Administered 2011-06-01 – 2011-06-02 (×2): 81 mg via ORAL
  Filled 2011-05-31 (×3): qty 1

## 2011-05-31 MED ORDER — ENOXAPARIN SODIUM 30 MG/0.3ML ~~LOC~~ SOLN
30.0000 mg | Freq: Two times a day (BID) | SUBCUTANEOUS | Status: DC
  Administered 2011-06-01 – 2011-06-02 (×3): 30 mg via SUBCUTANEOUS
  Filled 2011-05-31 (×6): qty 0.3

## 2011-05-31 MED ORDER — CLONIDINE HCL 0.2 MG PO TABS
0.2000 mg | ORAL_TABLET | Freq: Two times a day (BID) | ORAL | Status: DC
  Administered 2011-06-01 – 2011-06-02 (×3): 0.2 mg via ORAL
  Filled 2011-05-31 (×6): qty 1

## 2011-05-31 MED ORDER — CEFAZOLIN SODIUM 1-5 GM-% IV SOLN
1.0000 g | Freq: Three times a day (TID) | INTRAVENOUS | Status: DC
  Filled 2011-05-31 (×3): qty 50

## 2011-05-31 MED ORDER — VANCOMYCIN HCL 1000 MG IV SOLR
1500.0000 mg | INTRAVENOUS | Status: DC
  Administered 2011-06-01 – 2011-06-02 (×2): 1500 mg via INTRAVENOUS
  Filled 2011-05-31 (×4): qty 1500

## 2011-05-31 MED ORDER — SUMATRIPTAN SUCCINATE 100 MG PO TABS
100.0000 mg | ORAL_TABLET | Freq: Two times a day (BID) | ORAL | Status: DC | PRN
  Filled 2011-05-31: qty 1

## 2011-06-01 DIAGNOSIS — M009 Pyogenic arthritis, unspecified: Secondary | ICD-10-CM | POA: Diagnosis present

## 2011-06-01 MED ORDER — HYDROCODONE-ACETAMINOPHEN 5-325 MG PO TABS
1.0000 | ORAL_TABLET | ORAL | Status: DC | PRN
  Administered 2011-06-01 – 2011-06-02 (×4): 1 via ORAL
  Filled 2011-06-01 (×2): qty 1

## 2011-06-01 NOTE — Progress Notes (Signed)
  POD 2 I&D R knee   Subjective: Pain is mild.  No c/o chest pain or SOB.   Up with PT doing well.  Seen by Dr. Orvan Falconer with ID, rec PICC and several wks IV abx.   Objective: Vital signs in last 24 hours: Temp:  [99.1 F (37.3 C)] 99.1 F (37.3 C) (11/04 0512) Pulse Rate:  [18] 18  (11/04 0512) Resp:  [18] 18  (11/04 0512) BP: (112)/(73) 112/73 mmHg (11/04 0512) SpO2:  [93 %] 93 % (11/04 0512) Weight:  [73.93 kg (162 lb 15.8 oz)] 162 lb 15.8 oz (73.93 kg) (11/03 1234)  Intake/Output from previous day:   Intake/Output this shift: Total I/O In: -  Out: 20 [Drains:10; Other:10]   Basename 05/30/11 1220  HGB 10.3*    Basename 05/30/11 1220  WBC 9.4  RBC 3.57*  HCT 30.9*  PLT 325    Basename 05/30/11 1220  NA 140  K 4.5  CL 102  CO2 26  BUN 11  CREATININE 0.86  GLUCOSE 110*  CALCIUM 9.4    Basename 05/30/11 1220  LABPT --  INR 0.93    Neurovascular intact Sensation intact distally Intact pulses distally Dorsiflexion/Plantar flexion intact Incision: dressing C/D/I No cellulitis present  Assessment/Plan:   Up with therapy Continue ABX therapy due to Post-op infection Weight Bearing as Tolerated (WBAT)  VTE prophylaxis: intermittent pneumatic compression boots and aspirin PICC today.  Continue Vanc.   Mable Paris 06/01/2011, 7:07 AM

## 2011-06-01 NOTE — Progress Notes (Addendum)
  Subjective:    Patient ID: Samantha Ali, female    DOB: 1945/08/25, 65 y.o.   MRN: [id number]  HPI Comments: Samantha Ali is feeling a little better.      Review of Systems     Objective:   Physical Exam  Constitutional: No distress.  Musculoskeletal:       The right knee drain remains in with only a small amount of bloody drainage.  Her PICC has not been placed yet.          Assessment & Plan:  The synovial fluid cultures from 11/1 and 11/2 remain negative but were obtained after she started on Keflex. I will continue Vancomycin for now and await PICC placement. I will arrange follow up in our clinic soon. She wants to go to the [place] on 11/17.

## 2011-06-01 NOTE — Progress Notes (Signed)
Saw Aun[place] this afternoon.  Feeling well and tolerating food.  Up and OOB a little.  AFEB.  Plan to remove drains and change dressing in morning.  Hopefully will get [facility] line tomorrow AM and if remains AFEB and doing well will go home on IV Vanco tomorrow PM.  Appreciate ID help of Dr. Orvan Falconer.

## 2011-06-02 ENCOUNTER — Telehealth: Payer: Self-pay | Admitting: Oncology

## 2011-06-02 MED ORDER — SODIUM CHLORIDE 0.9 % IJ SOLN: 10.0000 mL | Freq: Two times a day (BID) | INTRAMUSCULAR | Status: DC

## 2011-06-02 MED ORDER — STUDY - INVESTIGATIONAL DRUG SIMPLE RECORD: 2.5000 mg | Freq: Every day | Status: DC

## 2011-06-02 MED ORDER — HYDROCODONE-ACETAMINOPHEN 5-325 MG PO TABS: 2.0000 | ORAL_TABLET | ORAL | Status: AC | PRN

## 2011-06-02 MED ORDER — SODIUM CHLORIDE 0.9 % IJ SOLN: 10.0000 mL | INTRAMUSCULAR | Status: DC | PRN

## 2011-06-02 MED ORDER — VANCOMYCIN HCL 1000 MG IV SOLR: 1500.0000 mg | INTRAVENOUS | Status: AC

## 2011-06-02 NOTE — Plan of Care (Signed)
Problem: Consults Goal: Diagnosis- Total Joint Replacement Outcome: Not Applicable Date Met:  06/02/11 Hemiarthroplasty patient in with a septic knee from previous surgery

## 2011-06-02 NOTE — Telephone Encounter (Signed)
Called pt lmovm that her lab appt was moved to / right before her office visit @ am

## 2011-06-02 NOTE — Progress Notes (Signed)
PATIENT ID:      Samantha Ali  MRN:     [id number] DOB/AGE:    01/13/1946 / 65 y.o.    PROGRESS NOTE Subjective:  negative for Chest Pain  negative for Shortness of Breath  negative for Nausea/Vomiting   negative for Calf Pain  negative for Bowel Movement   Tolerating Diet: yes         Patient reports pain as 2 on 0-10 scale.    Objective: Vital signs in last 24 hours: Temp:  [98.9 F (37.2 C)-99.6 F (37.6 C)] 99.2 F (37.3 C) (11/05 0533) Pulse Rate:  [74-81] 81  (11/05 0533) Resp:  [18] 18  (11/05 0533) BP: (103-129)/(52-80) 121/80 mmHg (11/05 0533) SpO2:  [92 %-96 %] 92 % (11/05 0533)    Intake/Output from previous day: I/O last 3 completed shifts: In: 970 [P.O.:720; IV Piggyback:250] Out: 22.5 [Drains:12.5; Other:10]   Intake/Output this shift:     LABORATORY DATA: @LABLAST (WBC:3,HGB:3,HCT:3,PLT:3) Sodium  Date/Time Value Range Status  05/30/2011 12:20 PM 140  135-145 (mEq/L) Final  03/12/2011  6:18 PM 141  135-145 (mEq/L) Final  05/27/2010  9:49 AM 141  135-145 (mEq/L) Final     Potassium  Date/Time Value Range Status  05/30/2011 12:20 PM 4.5  3.5-5.1 (mEq/L) Final  03/12/2011  6:18 PM 3.7  3.5-5.1 (mEq/L) Final  05/27/2010  9:49 AM 4.4  3.5-5.3 (mEq/L) Final     Chloride  Date/Time Value Range Status  05/30/2011 12:20 PM 102  96-112 (mEq/L) Final  03/12/2011  6:18 PM 102  96-112 (mEq/L) Final  05/27/2010  9:49 AM 102  96-112 (mEq/L) Final     CO2  Date/Time Value Range Status  05/30/2011 12:20 PM 26  19-32 (mEq/L) Final  03/12/2011  6:18 PM 26  19-32 (mEq/L) Final  05/27/2010  9:49 AM 28  19-32 (mEq/L) Final     BUN  Date/Time Value Range Status  05/30/2011 12:20 PM 11  6-23 (mg/dL) Final  08/15/1476  2:95 PM 15  6-23 (mg/dL) Final  62/13/0865  7:84 AM 14  6-23 (mg/dL) Final     Creatinine, Ser  Date/Time Value Range Status  05/30/2011 12:20 PM 0.86  0.50-1.10 (mg/dL) Final  6/96/2952  8:41 PM 1.00  0.50-1.10 (mg/dL) Final  32/44/0102  7:25 AM  0.91  0.40-1.20 (mg/dL) Final     Glucose, Bld  Date/Time Value Range Status  05/30/2011 12:20 PM 110* 70-99 (mg/dL) Final  3/66/4403  4:74 PM 93  70-99 (mg/dL) Final  25/95/6387  5:64 AM 101* 70-99 (mg/dL) Final     Calcium  Date/Time Value Range Status  05/30/2011 12:20 PM 9.4  8.4-10.5 (mg/dL) Final  3/32/9518  8:41 PM 9.5  8.4-10.5 (mg/dL) Final  66/12/3014  0:10 AM 9.5  8.4-10.5 (mg/dL) Final   Lab Results  Component Value Date   INR 0.93 05/30/2011    Examination:  General appearance: alert  Wound Exam: clean, dry, intact   Drainage:  None: wound tissue dry  Motor Exam Pinch Intact  Sensory Exam Radial normal  Assessment:       r septic knee  ADDITIONAL DIAGNOSIS:    Plan: Physical Therapy as ordered Touch Down Weight Bearing (TDWB)  DVT Prophylaxis:  Aspirin  DISCHARGE PLAN: Home  DISCHARGE NEEDS: IV Antibiotics         Raed Schalk R 06/02/2011, 9:22 AM

## 2011-06-02 NOTE — Progress Notes (Signed)
INFECTIOUS DISEASE PROGRESS NOTE  ID: Samantha Ali is a 65 y.o. female with  Septic arthritis s/p washout, cultures negative. Currently on vancomycin  Subjective: Feeling better. Fevers are disappearing. Looking forward to getting PICC and getting home, states the patient. No fever.chills.nightsweats. Swelling in affected knee is improved.  Abtx: vancomycin qday  Medications: I have reviewed the patient's current medications.  Objective: Vital signs in last 24 hours: Temp:  [98.5 F (36.9 C)-99.6 F (37.6 C)] 98.5 F (36.9 C) (11/05 1432) Pulse Rate:  [73-81] 73  (11/05 1432) Resp:  [18] 18  (11/05 1432) BP: (108-129)/(51-80) 108/51 mmHg (11/05 1432) SpO2:  [92 %-96 %] 95 % (11/05 1432) BP 108/51  Pulse 73  Temp(Src) 98.5 F (36.9 C) (Oral)  Resp 18  Ht 5\' 4"  (1.626 m)  Wt 73.93 kg (162 lb 15.8 oz)  BMI 27.98 kg/m2  SpO2 95%  BP 108/51  Pulse 73  Temp(Src) 98.5 F (36.9 C) (Oral)  Resp 18  Ht 5\' 4"  (1.626 m)  Wt 73.93 kg (162 lb 15.8 oz)  BMI 27.98 kg/m2  SpO2 95%  General Appearance:    Alert, cooperative, no distress, appears stated age  Head:    Normocephalic, without obvious abnormality, atraumatic  Eyes:    PERRL, conjunctiva/corneas clear, EOM's intact, fundi    benign, both eyes  Ears:    Normal TM's and external ear canals, both ears  Nose:   Nares normal, septum midline, mucosa normal, no drainage    or sinus tenderness  Throat:   Lips, mucosa, and tongue normal; teeth and gums normal  Neck:   Supple, symmetrical, trachea midline, no adenopathy;    thyroid:  no enlargement/tenderness/nodules; no carotid   bruit or JVD  Back:     Symmetric, no curvature, ROM normal, no CVA tenderness  Lungs:     Clear to auscultation bilaterally, respirations unlabored  Chest Wall:    No tenderness or deformity   Heart:    Regular rate and rhythm, S1 and S2 normal, no murmur, rub   or gallop  Breast Exam:    deferred  Abdomen:     Soft, non-tender, bowel sounds  active all four quadrants,    no masses, no organomegaly  Genitalia:    deferred  Rectal:    deferred  Extremities:   Extremities normal, atraumatic, no cyanosis or edema; right knee is wrapped, no warmth discrepancies between legs, no erythema  Pulses:   2+ and symmetric all extremities  Skin:   Skin color, texture, turgor normal, no rashes or lesions  Lymph nodes:   deferred  Neurologic:   deferred    Lab Results CBC    Component Value Date/Time   WBC 9.4 05/30/2011 1220   RBC 3.57* 05/30/2011 1220   HGB 10.3* 05/30/2011 1220   HGB 11.0* 05/27/2010 0949   HCT 30.9* 05/30/2011 1220   HCT 35.0 05/27/2010 0949   PLT 325 05/30/2011 1220   PLT 290 05/27/2010 0949   MCV 86.6 05/30/2011 1220   MCV 87.5 05/27/2010 0949   MCH 28.9 05/30/2011 1220   MCHC 33.3 05/30/2011 1220   RDW 15.7* 05/30/2011 1220   EOSABS 0.3 05/27/2010 0949   BASOSABS 0.1 05/27/2010 0949   CMP     Component Value Date/Time   NA 140 05/30/2011 1220   K 4.5 05/30/2011 1220   CL 102 05/30/2011 1220   CO2 26 05/30/2011 1220   GLUCOSE 110* 05/30/2011 1220   BUN 11 05/30/2011 1220  CREATININE 0.86 05/30/2011 1220   CALCIUM 9.4 05/30/2011 1220   PROT 6.7 05/27/2010 0949   ALBUMIN 4.3 05/27/2010 0949   AST 23 05/27/2010 0949   ALT 16 05/27/2010 0949   ALKPHOS 72 05/27/2010 0949   BILITOT 0.3 05/27/2010 0949   GFRNONAA 69* 05/30/2011 1220   GFRAA 80* 05/30/2011 1220    Microbiology: 11/2 cultures NGTD  Assessment/Plan: Recommend 4 wks of vancomycin for septic arthritis. Patient should have vanco trough checked before 4th dose to see if therapeutic at her current dose. Goal of 15-20. Will need weekly vanco trough and bmp to be done by home health. Patient is to follow up in dr. Blair Dolphin clinic on dec 4th at 9:15 am. At that time we will decide to d/c antibiotics  Devon Pretty Infectious Diseases 06/02/2011, 6:02 PM

## 2011-06-02 NOTE — Discharge Summary (Signed)
NAME:  Samantha Ali, BAIK NO.:  [id number]  MEDICAL RECORD NO.:  [id number]  LOCATION:  5036                         FACILITY:  [facility]  PHYSICIAN:  Lubertha Basque. Dalldorf, M.D.DATE OF BIRTH:  06/28/1946  DATE OF ADMISSION:  05/30/2011 DATE OF DISCHARGE:  06/02/2011                              DISCHARGE SUMMARY   ADMITTING DIAGNOSIS:  Right knee status post arthroscopy with infection.  DISCHARGE DIAGNOSIS:  Right knee status post arthroscopy with infection.  OPERATION:  Arthroscopic incision and drainage of right knee for infection.  BRIEF HISTORY:  Samantha Ali is a patient well known to our practice, 65- year-old, who had had a knee arthroscopy on the right side a week or two prior to this admission to the hospital and she had done reasonably well initially and then later began to develop right knee pain and stiffness with slight temperature elevation.  Aspiration in the office showed 48,000 WBCs with shift to the left on her white count.  We discussed treatment options with her that being arthroscopic I and D, and then admission to the hospital postop for appropriate consultation from Infectious Disease and antibiotic therapy.  PERTINENT LABORATORY AND X-RAY FINDINGS:  CBC hemoglobin 10.3, hematocrit 30.9, WBC is 9.4.  Sodium 140, potassium 4.5, BUN 11, glucose 110.  Cultures were negative and did not grow any organism.  A venous Doppler was done prior to admission to the hospital which was negative for DVT.  COURSE IN THE HOSPITAL:  Ms. Streng was admitted postoperatively from the right knee arthroscopy I and D, was placed on Ancef.  Initially, Dr. Orvan Falconer from Infectious Disease was consulted, and she was switched to vancomycin protocol per Pharmacy.  Postoperatively, she had 2 large Hemovac drains in place that were draining fluid from her knee.  Her hospital vital signs were essentially stable.  She was afebrile the last date of admission, was able to stand and  walk with assistance, touchdown weightbearing to full weightbearing on the right side, and she progressed well, was controlled on p.o. pain medications and was going to have a PICC line placed for IV antibiotic therapy at home that would be arranged through Infectious Disease.  The antibiotic of choice at this point was vancomycin in duration 2-4 weeks  tentatively per Dr. Orvan Falconer.  CONDITION ON DISCHARGE:  Improved.  PLAN:  Prior to leaving the hospital, the drains were removed.  Dressing was changed on the right knee and appeared essentially normal.  She had a PICC line placed prior to leaving the hospital and on IV vancomycin protocol per Pharmacy was arranged per Dr. Blair Dolphin length of duration per his request.  Appropriate pain medication prescriptions are given to the patient.  She is kept on her usual home medications unchanged, meds available through epic medical record.  Any sign of increasing pain or drainage to call our office at [phone].  Also, we would like to see her back in that same office in 1 week.  She may call that same number for an appointment.  Continue with ice and elevation.  May change the dressing on a daily or every-other-day basis. May be touchdown to weightbearing as tolerated.  Regular diet as tolerated.     Lindwood Qua, P.A.   ______________________________ Lubertha Basque. Jerl Santos, M.D.    MC/MEDQ  D:  06/02/2011  T:  06/02/2011  Job:  [id number]

## 2011-06-02 NOTE — Discharge Summary (Signed)
Dc dictation # [id number]

## 2011-06-03 LAB — BODY FLUID CULTURE: Culture: NO GROWTH

## 2011-06-04 ENCOUNTER — Telehealth: Payer: Self-pay | Admitting: *Deleted

## 2011-06-04 LAB — ANAEROBIC CULTURE

## 2011-06-04 NOTE — Telephone Encounter (Signed)
Received voice mail message from patient yesterday requesting change to Friday's appointments due to recent admission for knee surgery. Called patient back this morning and gave options for rescheduling as follows: Dr. Donnie Coffin Friday 11/23 @ 3:30 or Thursday 11/29 @ 10am; Debbora Presto Monday 11/19 @ 1:15pm or Tuesday 11/27 @ 1:15pm. Noted that study medication is expected to run out by 06/20/11, and gave option for lab appointment in advance of that date for medication exchange, with MD visit to follow.  Patient [place]17 to [place] 11/25. At this time, she would like to keep her appointments scheduled for this Friday.

## 2011-06-05 ENCOUNTER — Other Ambulatory Visit: Payer: Self-pay | Admitting: Oncology

## 2011-06-05 ENCOUNTER — Telehealth: Payer: Self-pay | Admitting: *Deleted

## 2011-06-05 NOTE — Telephone Encounter (Signed)
Patient called requesting an Rx for yeast infection.  Saw Dr. Orvan Falconer in the hospital and is on IV antibiotics.  Advised she will need to call her PCP because she has not established care here yet. She agreed to do this. Wendall Mola CMA

## 2011-06-06 ENCOUNTER — Encounter: Payer: Self-pay | Admitting: *Deleted

## 2011-06-06 ENCOUNTER — Telehealth: Payer: Self-pay | Admitting: *Deleted

## 2011-06-06 ENCOUNTER — Ambulatory Visit (HOSPITAL_BASED_OUTPATIENT_CLINIC_OR_DEPARTMENT_OTHER): Payer: Medicare Other | Admitting: Oncology

## 2011-06-06 ENCOUNTER — Other Ambulatory Visit: Payer: Self-pay | Admitting: Oncology

## 2011-06-06 ENCOUNTER — Other Ambulatory Visit (HOSPITAL_BASED_OUTPATIENT_CLINIC_OR_DEPARTMENT_OTHER): Payer: Medicare Other

## 2011-06-06 VITALS — BP 110/71 | HR 79 | Temp 98.8°F | Ht 64.0 in | Wt 165.2 lb

## 2011-06-06 DIAGNOSIS — C50919 Malignant neoplasm of unspecified site of unspecified female breast: Secondary | ICD-10-CM

## 2011-06-06 DIAGNOSIS — C50419 Malignant neoplasm of upper-outer quadrant of unspecified female breast: Secondary | ICD-10-CM

## 2011-06-06 LAB — CBC WITH DIFFERENTIAL/PLATELET
Eosinophils Absolute: 0 10*3/uL (ref 0.0–0.5)
HCT: 32.5 % — ABNORMAL LOW (ref 34.8–46.6)
LYMPH%: 16.8 % (ref 14.0–49.7)
MCHC: 32.7 g/dL (ref 31.5–36.0)
MCV: 87.9 fL (ref 79.5–101.0)
MONO#: 0.6 10*3/uL (ref 0.1–0.9)
MONO%: 7.1 % (ref 0.0–14.0)
NEUT%: 75.5 % (ref 38.4–76.8)
Platelets: 540 10*3/uL — ABNORMAL HIGH (ref 145–400)
WBC: 8.5 10*3/uL (ref 3.9–10.3)

## 2011-06-06 MED ORDER — LETROZOLE/PLACEBO 2.5MG TABS #110 NSABP B-42: 2.5000 mg | ORAL_TABLET | Freq: Every day | ORAL | Status: DC

## 2011-06-06 NOTE — Progress Notes (Signed)
Patient in to clinic today for Month 36 visit. Patient returned bottle #12 with 16 tablets remaining. Patient states she forgot to bring the empty bottle #11 back, but will return it at her next clinic visit for medication exchange in April. Patient has had no other health problems to report besides her recent fall, superficial thrombophlebitis, outpatient knee surgery, subsequent knee infection and which is currently being treated with outpatient vancomycin. Per Dr. Donnie Coffin, this grade 3 joint infection is unrelated to therapy with letrozole/placebo. Patient denies any other hospitalizations, cardiac events, blood clots or fractures. Her medication list was updated today. Investigational drug entry was erroneously discontinued at the time of patient's hospitalization. Patient reports that she estimates she may have missed five doses of study medication since her last visit.  Bottles #13 and #14 were dispensed to patient, and she will begin dosing with bottle #13 tomorrow. Patient aware that next follow-up visit will occur in approximately one year, with fasting labs for a lipid profile at that time. Plans made for a research appointment in six months for medication exchange. Patient will reschedule her bone density scan for December, due to her recent surgery.

## 2011-06-06 NOTE — Progress Notes (Signed)
Hematology and Oncology Follow Up Visit  Samantha Ali [id number] 1945-12-11 65 y.o. 06/06/2011 12:29 PM  CC; Samantha Ali, Samantha Ali, Samantha Ali  DIAGNOSIS:  T1 C. N0 breast cancer, status post left lumpectomy and sentinel lymph node evaluation in 2004 followed by radiation therapy previously on MA 27 study with completion of 5 years of AI therapy currently B40 study Encounter Diagnosis  Name Primary?  . Malignant neoplasm of breast (female), unspecified site Yes     PAST THERAPY: 5 years of radiation therapy, 5 years of AI therapy , currently on the b40 study   Interim History:  Since being seen last Samantha Ali has done relatively well. She unfortunately he had to undergo knee surgery a few months ago and subsequently developed a infection of her right joint. She had subsequent reexploration of her right knee joint and now is receiving 3 weeks of IV vancomycin chemotherapy. She has no other complaints from a breast cancer point of view and is tolerating the research medication  Medications: I have reviewed the patient's current medications.  Allergies: No Known Allergies  Past Medical History, Surgical history, Social history, and Family History were reviewed and updated.  Review of Systems: Constitutional:  Negative for fever, chills, night sweats, anorexia, weight loss, pain. Cardiovascular: no chest pain or dyspnea on exertion Respiratory: negative Neurological: negative Dermatological: negative ENT: negative Skin Gastrointestinal: negative Genito-Urinary: negative Hematological and Lymphatic: negative Breast: negative Musculoskeletal: negative Remaining ROS negative.  Physical Exam:  There were no vitals taken for this visit. Head and neck exam; no adenopathy  Breast exam; no masses, adenopathy, nipple retraction or other skin changes Cardiac exam; normal, no murmurs or bruits  Pulmonary exam; normal breath sounds  Abdominal exam; no organomegaly or  masses  Extremities normal  ECOG: 0   General appearance: alert and appears stated age and neck exam and   Lab Results: Lab Results  Component Value Date   WBC 9.4 05/30/2011   HGB 10.6* 06/06/2011   HCT 32.5* 06/06/2011   MCV 87.9 06/06/2011   PLT 540* 06/06/2011     Chemistry      Component Value Date/Time   NA 140 05/30/2011 1220   K 4.5 05/30/2011 1220   CL 102 05/30/2011 1220   CO2 26 05/30/2011 1220   BUN 11 05/30/2011 1220   CREATININE 0.86 05/30/2011 1220      Component Value Date/Time   CALCIUM 9.4 05/30/2011 1220   ALKPHOS 72 05/27/2010 0949   AST 23 05/27/2010 0949   ALT 16 05/27/2010 0949   BILITOT 0.3 05/27/2010 0949       Radiological Studies:  US Venous Img Lower Unilateral Right  05/29/2011  *RADIOLOGY REPORT*  Clinical Data: Right leg pain and swelling.  RIGHT LOWER EXTREMITY VENOUS DUPLEX ULTRASOUND  Technique:  Gray-scale sonography with graded compression, as well as color Doppler and duplex ultrasound, were performed to evaluate the deep venous system of the lower extremity from the level of the common femoral vein through the popliteal and proximal calf veins. Spectral Doppler was utilized to evaluate flow at rest and with distal augmentation maneuvers.  Comparison:  None  Findings: There is patent flow in the common femoral, profunda femoral, superficial femoral and popliteal veins.  These vessels were completely compressible and demonstrate increased flow with augmentation.  There is superficial thrombophlebitis involving the greater saphenous vein extending from the distal to proximal calf region. There is also superficial thrombophlebitis involving the short saphenous vein posteriorly in the calf  region and some branching muscular perforators.  IMPRESSION:  1.  Fairly extensive superficial thrombophlebitis below the knee. 2.  No findings for deep venous thrombosis. 3.  4 cm complex Baker's cyst.  Original Report Authenticated By: P. Loralie Champagne, M.D.      IMPRESSIONS AND PLAN: A 65 y.o. female with history of node-negative breast cancer status post radiation and hormonal therapy on regular followup. She had recent need surgery and subsequent treatment for septic arthritis. I will see her in a years time for followup. At of note is that she does have some mild anemia likely related to the underlying infection.   She will have a followup mammogram and bone density test within the year.  Spent more than half the time coordinating care.    Samantha Ali 11/9/201212:29 PM

## 2011-06-06 NOTE — Telephone Encounter (Signed)
Made patient appointment for 06-08-2012 starting at 11:00am.

## 2011-06-07 LAB — VITAMIN D 25 HYDROXY (VIT D DEFICIENCY, FRACTURES): Vit D, 25-Hydroxy: 72 ng/mL (ref 30–89)

## 2011-06-07 LAB — COMPREHENSIVE METABOLIC PANEL
CO2: 28 mEq/L (ref 19–32)
Creatinine, Ser: 1.23 mg/dL — ABNORMAL HIGH (ref 0.50–1.10)
Glucose, Bld: 98 mg/dL (ref 70–99)
Total Bilirubin: 0.3 mg/dL (ref 0.3–1.2)

## 2011-06-07 LAB — CANCER ANTIGEN 27.29: CA 27.29: 23 U/mL (ref 0–39)

## 2011-06-13 ENCOUNTER — Telehealth: Payer: Self-pay | Admitting: *Deleted

## 2011-06-13 NOTE — Telephone Encounter (Signed)
MADE PATIENT APPOINTMENT FOR BONE DENSITY ON 07-11-2011 AT 12:45PM AT [facility]. MADE PATIENT MAMMOGRAM APPOINTMENT FOR 01-05-2012 AT 1:00 AT [facility]

## 2011-07-01 ENCOUNTER — Encounter: Payer: Self-pay | Admitting: *Deleted

## 2011-07-01 ENCOUNTER — Encounter: Payer: Self-pay | Admitting: Internal Medicine

## 2011-07-01 ENCOUNTER — Ambulatory Visit (INDEPENDENT_AMBULATORY_CARE_PROVIDER_SITE_OTHER): Payer: Medicare Other | Admitting: Internal Medicine

## 2011-07-01 ENCOUNTER — Inpatient Hospital Stay: Payer: Medicare Other | Admitting: Internal Medicine

## 2011-07-01 DIAGNOSIS — M009 Pyogenic arthritis, unspecified: Secondary | ICD-10-CM

## 2011-07-01 NOTE — Progress Notes (Signed)
  Subjective:    Patient ID: Samantha Ali, female    DOB: Nov 18, 1945, 65 y.o.   MRN: [id number]  HPI Samantha Ali is in for her hospital followup visit. She is a 65 year old who underwent right knee arthroscopy and meniscectomy in late October. About 10 days postoperatively she developed pain, redness and swelling and was started on Keflex on October 29. She continued to worsen and underwent arthrocentesis on November 1. Gram stain and cultures were negative. Her white blood cell count synovial fluid was 48,000. She was admitted on November 2 and underwent arthroscopic washout. Operative Gram stain and cultures were again negative on November 3. I recommended a pick and IV vancomycin. She completed 4 weeks of therapy on November 26. She is feeling much better. She did not have any complications of the pick oral vancomycin. She is improving with her physical therapy twice weekly. She is only having minimal aching discomfort after physical therapy.    Review of Systems     Objective:   Physical Exam  Constitutional: No distress.  Musculoskeletal:       Her right knee is much improved. Her incision sites are healing well. She has very minimal swelling but no warmth or redness. She has good range of motion without discomfort.          Assessment & Plan:

## 2011-07-01 NOTE — Progress Notes (Signed)
RECEIVED A FAX FROM [employer] [place] CONCERNING A PRESCRIPTION DRUG INQUIRY FOR GENERIC EFFEXOR. THIS REQUEST WAS GIVEN TO MANAGED CARE.

## 2011-07-01 NOTE — Assessment & Plan Note (Signed)
She is much improved and I suspect that her septic arthritis is now cured. I will continue observation off of antibiotics. I've asked her to call me if she has any signs of recurrence.

## 2011-07-02 NOTE — Progress Notes (Signed)
Samantha Ali with [facility] [employer] called to verify receipt of a form for venlofaxine prior authorization.  Informed her this was received and given to [employer].

## 2011-07-08 ENCOUNTER — Telehealth: Payer: Self-pay | Admitting: *Deleted

## 2011-07-08 ENCOUNTER — Telehealth: Payer: Self-pay | Admitting: Oncology

## 2011-07-08 NOTE — Telephone Encounter (Signed)
Received phone call from patient stating that she spoke to triage nurse last week and that paperwork was submitted to [facility] for coverage of her Effexor. Patient says she left messages for Thelma Barge and Brandt Loosen to follow-up as a prompt response is needed for the review, and per [employer] no response received as of today. Patient states response can be called to [facility] at [phone] Option 2 to indicate that it is necessary for the patient to continue taking this medication. Patient asked that I assist her with this. Told patient that I would send emails to the involved parties and leave phone messages but also encouraged patient to continue to make calls until resolved. Note that this medication is not part of the patient's participation in the [employer] B-42 research study.

## 2011-07-08 NOTE — Telephone Encounter (Signed)
Called [phone] for a tier exception for venlafaxine 75mg . Tamela Oddi approved this exception for one year, 07/08/11-07/06/12 should cost patient $4.

## 2011-07-13 NOTE — Progress Notes (Signed)
Hematology and Oncology Follow Up Visit  Samantha Ali Samantha Ali [id number] 1946-04-18 65 y.o. 07/13/2011 9:42 PM PCP  Principle Diagnosis: T1C N0 breast cancer, s/p lumpectomy and SND 2004, followed by xrt , on MA27 study x 5 yrs of AI therapy, on B40 study.  Interim History:  There have been no intercurrent illness, hospitalizations or medication changes. She has been feeling well without complaints.  Medications: I have reviewed the patient's current medications.  Allergies: No Known Allergies  Past Medical History, Surgical history, Social history, and Family History were reviewed and updated.  Review of Systems: Constitutional:  Negative for fever, chills, night sweats, anorexia, weight loss, pain. Cardiovascular: no chest pain or dyspnea on exertion Respiratory: no cough, shortness of breath, or wheezing Neurological: negative Dermatological: negative ENT: negative Skin Gastrointestinal: no abdominal pain, change in bowel habits, or black or bloody stools Genito-Urinary: no dysuria, trouble voiding, or hematuria Hematological and Lymphatic: negative Breast: negative for breast lumps Musculoskeletal: negative Remaining ROS negative., apart from few hot flashes and some migratory aches and pains. No side effects from study med  Pysical Exam: Blood pressure 110/71, pulse 79, temperature 98.8 F (37.1 C), height 5\' 4"  (1.626 m), weight 165 lb 3.2 oz (74.934 kg). ECOG: 0 General appearance: alert, cooperative and appears stated age Head: Normocephalic, without obvious abnormality, atraumatic Neck: no adenopathy, no carotid bruit, no JVD, supple, symmetrical, trachea midline and thyroid not enlarged, symmetric, no tenderness/mass/nodules Lymph nodes: Cervical, supraclavicular, and axillary nodes normal. Cardiac : regular rate and rhythm, no murmurs or gallops Pulmonary:clear to auscultation bilaterally and normal percussion bilaterally Breasts: inspection  negative, no nipple discharge or bleeding, no masses or nodularity palpable Abdomen:soft, non-tender; bowel sounds normal; no masses,  no organomegaly Extremities negative Neuro: alert, oriented, normal speech, no focal findings or movement disorder noted  Lab Results: Lab Results  Component Value Date   WBC 8.5 06/06/2011   HGB 10.6* 06/06/2011   HCT 32.5* 06/06/2011   MCV 87.9 06/06/2011   PLT 540* 06/06/2011     Chemistry      Component Value Date/Time   NA 141 06/06/2011 1112   NA 141 06/06/2011 1112   NA 141 06/06/2011 1112   K 4.3 06/06/2011 1112   K 4.3 06/06/2011 1112   K 4.3 06/06/2011 1112   CL 102 06/06/2011 1112   CL 102 06/06/2011 1112   CL 102 06/06/2011 1112   CO2 28 06/06/2011 1112   CO2 28 06/06/2011 1112   CO2 28 06/06/2011 1112   BUN 15 06/06/2011 1112   BUN 15 06/06/2011 1112   BUN 15 06/06/2011 1112   CREATININE 1.23* 06/06/2011 1112   CREATININE 1.23* 06/06/2011 1112   CREATININE 1.23* 06/06/2011 1112      Component Value Date/Time   CALCIUM 9.2 06/06/2011 1112   CALCIUM 9.2 06/06/2011 1112   CALCIUM 9.2 06/06/2011 1112   ALKPHOS 78 06/06/2011 1112   ALKPHOS 78 06/06/2011 1112   ALKPHOS 78 06/06/2011 1112   AST 33 06/06/2011 1112   AST 33 06/06/2011 1112   AST 33 06/06/2011 1112   ALT 39* 06/06/2011 1112   ALT 39* 06/06/2011 1112   ALT 39* 06/06/2011 1112   BILITOT 0.3 06/06/2011 1112   BILITOT 0.3 06/06/2011 1112   BILITOT 0.3 06/06/2011 1112      .pathology. Radiological Studies: chest X-ray N/a mammogram 6/12- [facility]; NED Bone density Due currently  Impression and Plan: Samantha Ali is doing well, w/out evidence of recurrence, I will  see her in a yrs time.  More than 50% of the visit was spent in patient-related counselling   Pierce Crane, MD 12/16/20129:42 PM

## 2011-09-26 ENCOUNTER — Telehealth: Payer: Self-pay | Admitting: *Deleted

## 2011-09-26 NOTE — Telephone Encounter (Signed)
09/26/2011 11:00am Received phone call from patient stating that she has completed her current bottle of study medication and will begin dosing from bottle #14 tomorrow.

## 2011-10-28 ENCOUNTER — Other Ambulatory Visit: Payer: Self-pay | Admitting: *Deleted

## 2011-10-31 ENCOUNTER — Telehealth: Payer: Self-pay | Admitting: Oncology

## 2011-10-31 NOTE — Telephone Encounter (Signed)
Per message from cindy in research moved nov appts to wk of 10/14 for f/u w/fasting lbs 1wk prior to f/u. Per message cindy will contact pt re appts.

## 2011-11-10 ENCOUNTER — Telehealth: Payer: Self-pay | Admitting: *Deleted

## 2011-11-10 NOTE — Telephone Encounter (Signed)
Spoke with patient by phone today to coordinate Month 42 medication exchange visit. Patient states that she is currently preparing to leave this Friday for a 3-week trip to [place]. Patient reports current pill count of 76 tablets remaining, therefore she has adequate supply to last until mid-June. Plans made to contact patient on May 20th or 21st to coordinate medication exchange visit, following her return trip on Mother's Day weekend. Patient states that she does have a total of three pill bottles on hand that will be returned at that time.

## 2011-12-15 ENCOUNTER — Telehealth: Payer: Self-pay | Admitting: *Deleted

## 2011-12-15 NOTE — Telephone Encounter (Signed)
Received voice mail message from patient today stating that she has 30 tablets of study medication remaining. Contacted patient by phone who states that she would like to come in for medication exchange in June. Tentatively planned to arrange for medication exchange on either Monday, June 10th, or Wednesday, June 12th, at patient's convenience, preferably 1:30pm or later. Patient will contact research nurse at least one day in advance to allow for processing of study medication prescription.

## 2011-12-29 ENCOUNTER — Other Ambulatory Visit: Payer: Self-pay | Admitting: *Deleted

## 2011-12-30 ENCOUNTER — Encounter: Payer: Medicare Other | Admitting: *Deleted

## 2011-12-30 DIAGNOSIS — C50919 Malignant neoplasm of unspecified site of unspecified female breast: Secondary | ICD-10-CM

## 2011-12-30 NOTE — Progress Notes (Signed)
Patient in to clinic today for Month 42 visit.  Patient returned pill bottles #11 (Month 30), and #13 and 14 (Month 36). There were no tablets remaining in bottles 11 and 13, and there were 16 tablets remaining in bottle #14. Patient states that she has not missed any doses of study medication to her knowledge. She states she took them with her on vacation to [place]. Her medications are not changed and she continues to take Lipitor. She states she is on Mobic and had an injection in her knee before her trip, to avoid problems with worsening knee pain while on her trip.  Bottles #15 and 16 were dispensed to patient today and she will begin taking doses from the new supply tomorrow, June 5th.   Patient denies any other health problems, and has had no hospitalizations, blood clots or fractures.  Patient given a copy of updated appointments for October 2013, to include fasting labs at 0800 on 10/9. Patient reports that she had fasting labs collected at her primary care office last month. Will obtain results and notify patient whether or not fasting labs will need to be collected at the [facility] in October. Patient is aware that even if fasting results are not needed, she will still need to have blood tests done prior to her MD visit in October.

## 2011-12-31 ENCOUNTER — Telehealth: Payer: Self-pay | Admitting: *Deleted

## 2011-12-31 NOTE — Telephone Encounter (Signed)
Spoke with patient by phone to confirm that we have received a copy of her fasting lab results from 12/11/2011 at [facility]. Accordingly, lipid profile ordered for [facility] in October has been cancelled, and patient no longer needs to have a fasting sample collected at that time. Patient would like to change the lab appointment to around 10:30 or 11:00am. Will notify schedulers of request.

## 2012-01-01 MED ORDER — LETROZOLE/PLACEBO 2.5MG TABS #110 NSABP B-42: 2.5000 mg | ORAL_TABLET | Freq: Every day | ORAL | Status: DC

## 2012-01-02 ENCOUNTER — Telehealth: Payer: Self-pay | Admitting: Oncology

## 2012-01-02 NOTE — Telephone Encounter (Signed)
Per msg from Willis, moved lab appt scheduled for 10/9 @ 8am to 10/9 @ 10:45am due to pt no longer needing to fast. Mailed pt revised appt calender for Oct 2013.

## 2012-01-13 ENCOUNTER — Other Ambulatory Visit: Payer: Self-pay | Admitting: *Deleted

## 2012-01-13 MED ORDER — VENLAFAXINE HCL 75 MG PO TABS: 75.0000 mg | ORAL_TABLET | Freq: Every day | ORAL | Status: DC

## 2012-04-01 ENCOUNTER — Telehealth: Payer: Self-pay | Admitting: *Deleted

## 2012-04-01 NOTE — Telephone Encounter (Signed)
Patient called to inquire about glucose readings on labwork done at [facility]. Discussed glucose values present in Epic, noting that some were fasting values and some were not. Patient states she has been seeing her eye doctor for evaluations and has had increased migraines; eye doctor inquired if patient had glucose checked recently. Most recent value in Epic is for November 2012. Patient due to see her gynecologist tomorrow who sometimes does blood work. If she is unable to have glucose checked there, patient given the option to have her [facility] labs performed early (this month) instead of in October, and that she could schedule this in the AM to collect a fasting sample.

## 2012-04-02 ENCOUNTER — Other Ambulatory Visit: Payer: Self-pay | Admitting: Gynecology

## 2012-04-19 ENCOUNTER — Telehealth: Payer: Self-pay | Admitting: *Deleted

## 2012-04-19 NOTE — Telephone Encounter (Signed)
Received voice mail message from patient today stating that she has taken the last tablet from bottle #15. She states that she will begin dosing from bottle #16 starting tomorrow.

## 2012-04-27 ENCOUNTER — Other Ambulatory Visit: Payer: Self-pay | Admitting: *Deleted

## 2012-05-05 ENCOUNTER — Other Ambulatory Visit: Payer: Medicare Other

## 2012-05-05 ENCOUNTER — Other Ambulatory Visit (HOSPITAL_BASED_OUTPATIENT_CLINIC_OR_DEPARTMENT_OTHER): Payer: Medicare Other | Admitting: Lab

## 2012-05-05 DIAGNOSIS — C50419 Malignant neoplasm of upper-outer quadrant of unspecified female breast: Secondary | ICD-10-CM

## 2012-05-05 DIAGNOSIS — C50919 Malignant neoplasm of unspecified site of unspecified female breast: Secondary | ICD-10-CM

## 2012-05-05 LAB — CBC WITH DIFFERENTIAL/PLATELET
Basophils Absolute: 0.1 10*3/uL (ref 0.0–0.1)
Eosinophils Absolute: 0.3 10*3/uL (ref 0.0–0.5)
HCT: 34 % — ABNORMAL LOW (ref 34.8–46.6)
HGB: 11.1 g/dL — ABNORMAL LOW (ref 11.6–15.9)
LYMPH%: 23.5 % (ref 14.0–49.7)
MCV: 88.5 fL (ref 79.5–101.0)
MONO#: 0.5 10*3/uL (ref 0.1–0.9)
NEUT#: 4.3 10*3/uL (ref 1.5–6.5)
NEUT%: 63.1 % (ref 38.4–76.8)
Platelets: 235 10*3/uL (ref 145–400)
WBC: 6.8 10*3/uL (ref 3.9–10.3)

## 2012-05-05 LAB — COMPREHENSIVE METABOLIC PANEL (CC13)
Alkaline Phosphatase: 95 U/L (ref 40–150)
CO2: 23 mEq/L (ref 22–29)
Creatinine: 1.1 mg/dL (ref 0.6–1.1)
Glucose: 93 mg/dl (ref 70–99)
Total Bilirubin: 0.4 mg/dL (ref 0.20–1.20)

## 2012-05-11 ENCOUNTER — Encounter: Payer: Medicare Other | Admitting: *Deleted

## 2012-05-11 ENCOUNTER — Ambulatory Visit (HOSPITAL_BASED_OUTPATIENT_CLINIC_OR_DEPARTMENT_OTHER): Payer: Medicare Other | Admitting: Oncology

## 2012-05-11 ENCOUNTER — Telehealth: Payer: Self-pay | Admitting: Oncology

## 2012-05-11 VITALS — BP 122/75 | HR 93 | Temp 98.6°F | Resp 20 | Ht 63.0 in | Wt 168.3 lb

## 2012-05-11 DIAGNOSIS — C50919 Malignant neoplasm of unspecified site of unspecified female breast: Secondary | ICD-10-CM

## 2012-05-11 MED ORDER — LETROZOLE/PLACEBO 2.5MG TABS #110 NSABP B-42: 2.5000 mg | ORAL_TABLET | Freq: Every day | ORAL | Status: DC

## 2012-05-11 NOTE — Progress Notes (Signed)
Hematology and Oncology Follow Up Visit  Samantha Ali Samantha Ali [id number] Sep 26, 1945 66 y.o. 05/11/2012 12:06 PM PCP  Principle Diagnosis: T1C N0 breast cancer, s/p lumpectomy and SND 2004, followed by xrt , on MA27 study x 5 yrs of AI therapy, on B40 study.  Interim History:  There have been no intercurrent illness, hospitalizations or medication changes. She has been feeling well without complaints.  Medications: I have reviewed the patient's current medications.  Allergies: No Known Allergies  Past Medical History, Surgical history, Social history, and Family History were reviewed and updated.  Review of Systems: Constitutional:  Negative for fever, chills, night sweats, anorexia, weight loss, pain. Cardiovascular: no chest pain or dyspnea on exertion Respiratory: no cough, shortness of breath, or wheezing Neurological: negative Dermatological: negative ENT: negative Skin Gastrointestinal: no abdominal pain, change in bowel habits, or black or bloody stools Genito-Urinary: no dysuria, trouble voiding, or hematuria Hematological and Lymphatic: negative Breast: negative for breast lumps Musculoskeletal: negative Remaining ROS negative., apart from few hot flashes and some migratory aches and pains. No side effects from study med  Pysical Exam: Blood pressure 122/75, pulse 93, temperature 98.6 F (37 C), temperature source Oral, resp. rate 20, height 5\' 3"  (1.6 m), weight 168 lb 4.8 oz (76.34 kg). ECOG: 0 General appearance: alert, cooperative and appears stated age Head: Normocephalic, without obvious abnormality, atraumatic Neck: no adenopathy, no carotid bruit, no JVD, supple, symmetrical, trachea midline and thyroid not enlarged, symmetric, no tenderness/mass/nodules Lymph nodes: Cervical, supraclavicular, and axillary nodes normal. Cardiac : regular rate and rhythm, no murmurs or gallops Pulmonary:clear to auscultation bilaterally and normal percussion  bilaterally Breasts: inspection negative, no nipple discharge or bleeding, no masses or nodularity palpable Abdomen:soft, non-tender; bowel sounds normal; no masses,  no organomegaly Extremities negative Neuro: alert, oriented, normal speech, no focal findings or movement disorder noted  Lab Results: Lab Results  Component Value Date   WBC 6.8 05/05/2012   HGB 11.1* 05/05/2012   HCT 34.0* 05/05/2012   MCV 88.5 05/05/2012   PLT 235 05/05/2012     Chemistry      Component Value Date/Time   NA 140 05/05/2012 1058   NA 141 06/06/2011 1112   NA 141 06/06/2011 1112   NA 141 06/06/2011 1112   K 3.9 05/05/2012 1058   K 4.3 06/06/2011 1112   K 4.3 06/06/2011 1112   K 4.3 06/06/2011 1112   CL 105 05/05/2012 1058   CL 102 06/06/2011 1112   CL 102 06/06/2011 1112   CL 102 06/06/2011 1112   CO2 23 05/05/2012 1058   CO2 28 06/06/2011 1112   CO2 28 06/06/2011 1112   CO2 28 06/06/2011 1112   BUN 23.0 05/05/2012 1058   BUN 15 06/06/2011 1112   BUN 15 06/06/2011 1112   BUN 15 06/06/2011 1112   CREATININE 1.1 05/05/2012 1058   CREATININE 1.23* 06/06/2011 1112   CREATININE 1.23* 06/06/2011 1112   CREATININE 1.23* 06/06/2011 1112      Component Value Date/Time   CALCIUM 9.4 05/05/2012 1058   CALCIUM 9.2 06/06/2011 1112   CALCIUM 9.2 06/06/2011 1112   CALCIUM 9.2 06/06/2011 1112   ALKPHOS 95 05/05/2012 1058   ALKPHOS 78 06/06/2011 1112   ALKPHOS 78 06/06/2011 1112   ALKPHOS 78 06/06/2011 1112   AST 24 05/05/2012 1058   AST 33 06/06/2011 1112   AST 33 06/06/2011 1112   AST 33 06/06/2011 1112   ALT 24 05/05/2012 1058   ALT  39* 06/06/2011 1112   ALT 39* 06/06/2011 1112   ALT 39* 06/06/2011 1112   BILITOT 0.40 05/05/2012 1058   BILITOT 0.3 06/06/2011 1112   BILITOT 0.3 06/06/2011 1112   BILITOT 0.3 06/06/2011 1112      .pathology. Radiological Studies: chest X-ray N/a mammogram 6/12- [facility]; NED Bone density Due currently  Impression and Plan: Lyndia is doing well, w/out evidence of recurrence, I will see her in a  yrs time. I discussed that sheet  study with the patient. She is agreed to enroll we will make those arrangements for her.   More than 50% of the visit was spent in patient-related counselling   Pierce Crane, MD 10/15/201312:06 PM

## 2012-05-11 NOTE — Telephone Encounter (Signed)
gve the pt her oct 2014 appt calendar °

## 2012-05-11 NOTE — Progress Notes (Signed)
Patient in to clinic today for Month 48 visit.   Patient returned pill bottles #15 and 16. There were no tablets remaining in bottle 15, and 88 tablets remaining in bottle #16. Bottles were returned to pharmacist Concha Norway for drug accountability. Patient states that she has not missed any doses of study medication to her knowledge. Her medications are not changed and she continues to take Lipitor.  Bottles #17 and 18 were dispensed to patient today and she will begin taking doses from the new supply tomorrow, October 16th.  Patient denies any other health problems, and has had no hospitalizations, blood clots or fractures.   Patient is aware that there is one year remaining of protocol therapy, at which time she will have a follow-up visit with Dr. Donnie Coffin at the end of study treatment, expected to occur around 05/27/2013. She will be contacted in six months to arrange a medication exchange visit for her final six month supply of study medication. She would like to have her June 2014 mammogram scheduled at [facility] while she is here today.  A copy of the patient's HgbA1c results were received from Dr. Nicholas Lose. Results were forwarded to Dr. Donnie Coffin for review and to medical records for scanning into the EMR. Patient encouraged to review these results with her PCP at her upcoming visit.

## 2012-06-07 ENCOUNTER — Other Ambulatory Visit: Payer: Medicare Other | Admitting: Lab

## 2012-06-08 ENCOUNTER — Other Ambulatory Visit: Payer: Medicare Other | Admitting: Lab

## 2012-06-08 ENCOUNTER — Ambulatory Visit: Payer: Medicare Other | Admitting: Oncology

## 2012-06-28 ENCOUNTER — Other Ambulatory Visit: Payer: Self-pay | Admitting: *Deleted

## 2012-06-28 ENCOUNTER — Encounter: Payer: Medicare Other | Admitting: Cardiology

## 2012-06-28 DIAGNOSIS — C50919 Malignant neoplasm of unspecified site of unspecified female breast: Secondary | ICD-10-CM

## 2012-06-28 MED ORDER — CLONIDINE HCL 0.2 MG PO TABS: ORAL_TABLET | ORAL | Status: DC

## 2012-07-01 ENCOUNTER — Telehealth: Payer: Self-pay | Admitting: *Deleted

## 2012-07-01 NOTE — Telephone Encounter (Signed)
Received fax from [facility] pharmacy asking for MD signature for clonidine.  Dr. Donnie Coffin signed & this was faxed back to [phone].

## 2012-07-07 ENCOUNTER — Encounter: Payer: Medicare Other | Admitting: Cardiology

## 2012-08-04 ENCOUNTER — Encounter: Payer: Self-pay | Admitting: Cardiology

## 2012-08-04 ENCOUNTER — Ambulatory Visit (INDEPENDENT_AMBULATORY_CARE_PROVIDER_SITE_OTHER): Payer: Medicare Other | Admitting: Cardiology

## 2012-08-04 VITALS — BP 128/86 | HR 67 | Ht 63.5 in | Wt 167.0 lb

## 2012-08-04 DIAGNOSIS — R002 Palpitations: Secondary | ICD-10-CM

## 2012-08-04 DIAGNOSIS — R079 Chest pain, unspecified: Secondary | ICD-10-CM

## 2012-08-04 NOTE — Patient Instructions (Addendum)
Will have you go to the [facility] building across from [facility] for a chest Xray  Your physician has requested that you have a lexiscan myoview. For further information please visit [email or web address] Please follow instruction sheet, as given.  Your physician recommends that you continue on your current medications as directed. Please refer to the Current Medication list given to you today.

## 2012-08-04 NOTE — Progress Notes (Signed)
Samantha Ali Date of Birth:  Jun 03, 1946 [facility] [address] [place], [place]  [address] [phone]         Fax   [phone]  History of Present Illness: This pleasant 67 year old woman is seen after a long absence.  We last saw her in 2010.  She comes in now over concerned about some left-sided chest discomfort which she has been experiencing in the evening when she is lying in bed prior to falling asleep.  The chest pain is also associated with palpitations.  She has not been experiencing any symptoms during the day when she is active.  She does not have any history of known ischemic heart disease.  In April 2010 she was at the [employer] and had exertional chest pain and at that time was hospitalized overnight and underwent a stress echo using dobutamine and the stress test was felt to be normal. Family history is of note in that her father died of a heart attack at age 47 Social history reveals that she is married.  She is retired from business.  She does not use alcohol or tobacco. Past social history reveals that she had breast cancer in 2004 followed by radiation.  She also had right knee surgery in 2008.  She has a past history of remote superficial phlebitis of the right leg but no history of pulmonary embolus.  Current Outpatient Prescriptions  Medication Sig Dispense Refill  . aspirin 81 MG EC tablet Take 81 mg by mouth daily.       Marland Kitchen atorvastatin (LIPITOR) 10 MG tablet Take 10 mg by mouth daily.       . Cholecalciferol (VITAMIN D3) 1000 UNITS CAPS Take 1 tablet by mouth 2 (two) times daily.       . cloNIDine (CATAPRES) 0.2 MG tablet TAKE 1/2 TAB EVERY MORNING AND 1/2 TAB EVERY EVENING.  90 tablet  3  . esomeprazole (NEXIUM) 20 MG capsule Take 20 mg by mouth at bedtime.       . Investigational letrozole/placebo tablet NSABP B-42 Take 1 tablet by mouth daily.  220 tablet  0  . magnesium oxide (MAG-OX) 400 MG tablet Take 400 mg by mouth  daily.      . meloxicam (MOBIC) 15 MG tablet Take 15 mg by mouth daily.        Marland Kitchen omeprazole (PRILOSEC) 10 MG capsule Take 10 mg by mouth 2 (two) times daily.       . traZODone (DESYREL) 50 MG tablet Take 25 mg by mouth at bedtime.       Marland Kitchen venlafaxine (EFFEXOR) 75 MG tablet Take 1 tablet (75 mg total) by mouth daily.  90 tablet  3    No Known Allergies  Patient Active Problem List  Diagnosis  . Breast cancer  . Septic joint of right knee joint    History  Smoking status  . Never Smoker   Smokeless tobacco  . Never Used    History  Alcohol Use No    Family History  Problem Relation Age of Onset  . Heart disease Father     Review of Systems: Constitutional: no fever chills diaphoresis or fatigue or change in weight.  Head and neck: no hearing loss, no epistaxis, no photophobia or visual disturbance. Respiratory: No cough, shortness of breath or wheezing. Cardiovascular: No chest pain peripheral edema, palpitations. Gastrointestinal: No abdominal distention, no abdominal pain, no change in bowel habits hematochezia or melena. Genitourinary: No dysuria, no  frequency, no urgency, no nocturia. Musculoskeletal:No arthralgias, no back pain, no gait disturbance or myalgias. Neurological: No dizziness, no headaches, no numbness, no seizures, no syncope, no weakness, no tremors. Hematologic: No lymphadenopathy, no easy bruising. Psychiatric: No confusion, no hallucinations, no sleep disturbance.    Physical Exam: Filed Vitals:   08/04/12 1606  BP: 128/86  Pulse: 67   the general appearance reveals a well-developed well-nourished middle-aged woman in no distress.The head and neck exam reveals pupils equal and reactive.  Extraocular movements are full.  There is no scleral icterus.  The mouth and pharynx are normal.  The neck is supple.  The carotids reveal no bruits.  The jugular venous pressure is normal.  The  thyroid is not enlarged.  There is no lymphadenopathy.  The chest is  clear to percussion and auscultation.  There are no rales or rhonchi.  Expansion of the chest is symmetrical.  The precordium is quiet.  The first heart sound is normal.  The second heart sound is physiologically split.  There is no murmur gallop rub or click.  There is no abnormal lift or heave.  The abdomen is soft and nontender.  The bowel sounds are normal.  The liver and spleen are not enlarged.  There are no abdominal masses.  There are no abdominal bruits.  Extremities reveal good pedal pulses.  There is no phlebitis or edema.  There is no cyanosis or clubbing.  Strength is normal and symmetrical in all extremities.  There is no lateralizing weakness.  There are no sensory deficits.  The skin is warm and dry.  There is no rash.  EKG today shows normal sinus rhythm and no ischemic changes at rest.   Assessment / Plan: 1. chest pain of uncertain etiology.  The patient has risk factors for ischemic heart disease including hypercholesterolemia and family history of early heart attacks.  She is not able to exercise adequately on a treadmill because of her prior right knee surgery and we will have her return soon for a Lexus scan Myoview.  The patient has not had a recent chest x-ray and we will also obtain a chest x-ray.  No change in her medications.  The character of her nocturnal palpitations which are associated with the chest pain may need to be evaluated with an outpatient event monitor a later date but first we will evaluate her potential for ischemic heart disease.

## 2012-08-05 ENCOUNTER — Ambulatory Visit (INDEPENDENT_AMBULATORY_CARE_PROVIDER_SITE_OTHER)
Admission: RE | Admit: 2012-08-05 | Discharge: 2012-08-05 | Disposition: A | Discharge status: Home / Self Care | Payer: Medicare Other | Source: Ambulatory Visit | Attending: Cardiology | Admitting: Cardiology

## 2012-08-05 ENCOUNTER — Other Ambulatory Visit: Payer: Self-pay | Admitting: *Deleted

## 2012-08-05 DIAGNOSIS — R079 Chest pain, unspecified: Secondary | ICD-10-CM

## 2012-08-11 ENCOUNTER — Ambulatory Visit (HOSPITAL_COMMUNITY): Payer: Medicare Other | Attending: Cardiovascular Disease | Admitting: Radiology

## 2012-08-11 VITALS — BP 138/100 | HR 74 | Ht 63.5 in | Wt 168.0 lb

## 2012-08-11 DIAGNOSIS — R079 Chest pain, unspecified: Secondary | ICD-10-CM

## 2012-08-11 DIAGNOSIS — R0789 Other chest pain: Secondary | ICD-10-CM | POA: Insufficient documentation

## 2012-08-11 DIAGNOSIS — R002 Palpitations: Secondary | ICD-10-CM | POA: Insufficient documentation

## 2012-08-11 DIAGNOSIS — Z8249 Family history of ischemic heart disease and other diseases of the circulatory system: Secondary | ICD-10-CM | POA: Insufficient documentation

## 2012-08-11 MED ORDER — TECHNETIUM TC 99M SESTAMIBI GENERIC - CARDIOLITE
10.9000 | Freq: Once | INTRAVENOUS | Status: AC | PRN
  Administered 2012-08-11: 10.9 via INTRAVENOUS

## 2012-08-11 MED ORDER — AMINOPHYLLINE 25 MG/ML IV SOLN
75.0000 mg | Freq: Once | INTRAVENOUS | Status: AC
  Administered 2012-08-11: 75 mg via INTRAVENOUS

## 2012-08-11 MED ORDER — REGADENOSON 0.4 MG/5ML IV SOLN
0.4000 mg | Freq: Once | INTRAVENOUS | Status: AC
  Administered 2012-08-11: 0.4 mg via INTRAVENOUS

## 2012-08-11 MED ORDER — TECHNETIUM TC 99M SESTAMIBI GENERIC - CARDIOLITE
33.0000 | Freq: Once | INTRAVENOUS | Status: AC | PRN
  Administered 2012-08-11: 33 via INTRAVENOUS

## 2012-08-11 NOTE — Progress Notes (Signed)
[facility] SITE 3 NUCLEAR MED [address] [place], [place] [address] [phone]    Cardiology Nuclear Med Study  Samantha Ali is a 67 y.o. female     MRN : [id number]     DOB: 1945/08/21  Procedure Date: 08/11/2012  Nuclear Med Background Indication for Stress Test:  Evaluation for Ischemia History:  '10 Dobutamine Echo:Normal Cardiac Risk Factors: Family History - CAD and Lipids  Symptoms:  Chest Tightness>Throat (last episode of chest discomfort was while under the camera this morning, 3-4/10), Diaphoresis and Palpitations   Nuclear Pre-Procedure Caffeine/Decaff Intake:  None NPO After: 6:00pm   Lungs:  Clear. O2 Sat: 100% on room air. IV 0.9% NS with Angio Cath:  22g  IV Site: R Hand  IV Started by:  Samantha Ali, CNMT  Chest Size (in):  36 Cup Size: C  Height: 5' 3.5" (1.613 m)  Weight:  168 lb (76.204 kg)  BMI:  Body mass index is 29.29 kg/(m^2). Tech Comments:  Took all am Insurance underwriter Med Study 1 or 2 day study: 1 day  Stress Test Type:  Lexiscan  Reading MD: Kristeen Miss, MD  Order Authorizing Provider:  Cassell Clement, MD  Resting Radionuclide: Technetium 48m Sestamibi  Resting Radionuclide Dose: 10.9 mCi   Stress Radionuclide:  Technetium 71m Sestamibi  Stress Radionuclide Dose: 32.9 mCi           Stress Protocol Rest HR: 74 Stress HR: 99  Rest BP: 138/100 Stress BP: 154/96  Exercise Time (min): n/a METS: n/a   Predicted Max HR: 153 bpm % Max HR: 64.71 bpm Rate Pressure Product: 21308    Dose of Adenosine (mg):  n/a Dose of Lexiscan: 0.4 mg Dose of Aminophylline:75 mg  Dose of Atropine (mg): n/a Dose of Dobutamine: n/a mcg/kg/min (at max HR)  Stress Test Technologist: Smiley Houseman, CMA-N  Nuclear Technologist:  Domenic Polite, CNMT     Rest Procedure:  Myocardial perfusion imaging was performed at rest 45 minutes following the intravenous administration of Technetium 46m Sestamibi.  Rest ECG: NSR - Normal EKG  Stress  Procedure:  The patient received IV Lexiscan 0.4 mg over 15-seconds.  Technetium 16m Sestamibi injected at 30-seconds.  She c/o shortness of breath, dizziness and chest tightness 5-6/10 with Lexiscan; relieved with Aminophylline 75 mg.  Quantitative spect images were obtained after a 45 minute delay.  Stress ECG: No significant change from baseline ECG  QPS Raw Data Images:  Normal; no motion artifact; normal heart/lung ratio. Stress Images:  Normal homogeneous uptake in all areas of the myocardium. Rest Images:  Normal homogeneous uptake in all areas of the myocardium. Subtraction (SDS):  No evidence of ischemia. Transient Ischemic Dilatation (Normal <1.22):  1.14 Lung/Heart Ratio (Normal <0.45):  0.38  Quantitative Gated Spect Images QGS EDV:  77 ml QGS ESV:  31 ml  Impression Exercise Capacity:  Lexiscan with no exercise. BP Response:  Normal blood pressure response. Clinical Symptoms:  No significant symptoms noted. ECG Impression:  No significant ST segment change suggestive of ischemia. Comparison with Prior Nuclear Study: No previous nuclear study performed  Overall Impression:  Normal stress nuclear study.  No evidence of ischemia.    LV Ejection Fraction: 59%.  LV Wall Motion:  NL LV Function; NL Wall Motion.   Vesta Mixer, Montez Hageman., MD, [facility] 08/11/2012, 4:47 PM Office - [phone] Pager [phone]

## 2012-08-12 ENCOUNTER — Telehealth: Payer: Self-pay | Admitting: Cardiology

## 2012-08-12 NOTE — Telephone Encounter (Signed)
F/u     Receive a call on yesterday. Returning call back nurse.

## 2012-08-12 NOTE — Telephone Encounter (Signed)
I cannot tell where anyone from our office called her.  Advised pt of preliminary stress test results and advised her that she will probably hear from [facility] once Dr. Patty Sermons reviews the stress test.  Pt agreed.

## 2012-08-20 ENCOUNTER — Telehealth: Payer: Self-pay | Admitting: *Deleted

## 2012-08-20 NOTE — Telephone Encounter (Signed)
Message copied by Burnell Blanks on Fri Aug 20, 2012  5:44 PM ------      Message from: Cassell Clement      Created: Thu Aug 05, 2012  9:22 PM       Pl report.  The heart is slightly enlarged. Lungs clear.

## 2012-08-20 NOTE — Telephone Encounter (Signed)
Message copied by Burnell Blanks on Fri Aug 20, 2012  5:43 PM ------      Message from: Cassell Clement      Created: Sun Aug 15, 2012  8:55 PM       Please report.  Stress test was normal. EF normal. No sign of blockage.      If she continues to have palpitations we should have her wear an event monitor.

## 2012-08-20 NOTE — Telephone Encounter (Signed)
Advised patient.  Patient will call if she decides to wear monitor

## 2012-08-30 ENCOUNTER — Telehealth: Payer: Self-pay | Admitting: *Deleted

## 2012-08-30 NOTE — Telephone Encounter (Signed)
Received phone call from patient stating that she will begin dosing from study medication bottle #18 tomorrow. Patient noted that she has received bills from [facility] including a $40 co-payment for her last office visit (December statement for $42.24) and believes she is now getting a bill for $84. She was told that billing was running behind and that she expects to be receiving another bill in February. She thinks she was billed $39 for the therapeutic linens she received in a study she participated in. Patient states she is contacting billing to discuss these items and to "get them sorted out".

## 2012-10-27 ENCOUNTER — Telehealth: Payer: Self-pay | Admitting: *Deleted

## 2012-10-27 ENCOUNTER — Encounter: Payer: Self-pay | Admitting: Oncology

## 2012-10-27 NOTE — Telephone Encounter (Signed)
Confirmed 05/17/13 appt w/ pt.  Mailed letter & calendar to pt.  Emailed Melissa [employer] Maureen Ralphs to make them aware.

## 2012-11-08 ENCOUNTER — Other Ambulatory Visit: Payer: Self-pay | Admitting: *Deleted

## 2012-11-08 ENCOUNTER — Encounter: Payer: Self-pay | Admitting: Oncology

## 2012-11-08 ENCOUNTER — Telehealth: Payer: Self-pay | Admitting: Oncology

## 2012-11-08 ENCOUNTER — Telehealth: Payer: Self-pay | Admitting: *Deleted

## 2012-11-08 DIAGNOSIS — C50912 Malignant neoplasm of unspecified site of left female breast: Secondary | ICD-10-CM

## 2012-11-08 NOTE — Telephone Encounter (Signed)
lmonvm for pt re appt for mammo @ [facility] 01/12/13 @ 10:30am. Referral for mammo and schedule for [facility] appts mailed.

## 2012-11-08 NOTE — Telephone Encounter (Signed)
11/08/2012 1:57 PM  Spoke with patient by phone today regarding scheduling of medication exchange visit for Month 54 of protocol therapy. Patient states she will be able to come to the [facility] for this visit on Tuesday, April 29th at 2:00pm.  Patient states that she received information from scheduler Baltazar Apo regarding rescheduling of October appointments due to Dr. Renelda Loma departure. Explained to patient that appointments will need to be rescheduled to Dr. Darrall Dears schedule in November, due to protocol requirements. Patient verbalized understanding.

## 2012-11-16 ENCOUNTER — Telehealth: Payer: Self-pay | Admitting: Oncology

## 2012-11-16 NOTE — Telephone Encounter (Signed)
S/w the pt and she is aware of her r/s oct appts that have been moved to November.

## 2012-11-16 NOTE — Telephone Encounter (Signed)
S/w cindy shaw in research dept and she will give the pt the nov schedule when she comes in on  April 29th.

## 2012-11-23 ENCOUNTER — Encounter: Payer: Medicare Other | Admitting: *Deleted

## 2012-11-23 DIAGNOSIS — C50912 Malignant neoplasm of unspecified site of left female breast: Secondary | ICD-10-CM

## 2012-11-23 MED ORDER — LETROZOLE/PLACEBO 2.5MG TABS #110 NSABP B-42: 2.5000 mg | ORAL_TABLET | Freq: Every day | ORAL | Status: DC

## 2012-11-23 NOTE — Addendum Note (Signed)
Addended by: Bernerd Pho on: 11/23/2012 03:22 PM   Modules accepted: Orders

## 2012-11-23 NOTE — Progress Notes (Signed)
Patient in to clinic today for NSABP B-42 medication exchange.  Patient returned partially filled bottle #18 today. Patient states that she forgot to bring the empty bottle #17, but she will return it at her next office visit. Bottles #19 and 20 were dispensed to patient today and she will begin dosing from bottle #19 tomorrow. She states that she has not missed any doses of study medication. Patient denies any new symptoms, cardiac problems, blood clots, fractures or hospitalizations since her last visit in October. She continues to take Lipitor and takes no prescription medications for bone density.  Patient was also given a copy of the appointment calendar for November 2014, including a note to take last dose of study medication on October 31st.   Bottle #18 returned to pharmacist Delorise Shiner for proper drug accountability. Per pharmacist count, 25 tablets remained in the bottle.  Cindy S. Clelia Croft BSN, RN, CCRP 11/23/2012 3:15 PM

## 2012-12-03 IMAGING — CR DG KNEE COMPLETE 4+V*R*
1 series · 1 of 1 positions shown · non-contrast
Comparison: None.

CLINICAL DATA: Fall.  Right knee pain.

RIGHT KNEE - COMPLETE 4+ VIEW

[t knee ap left]
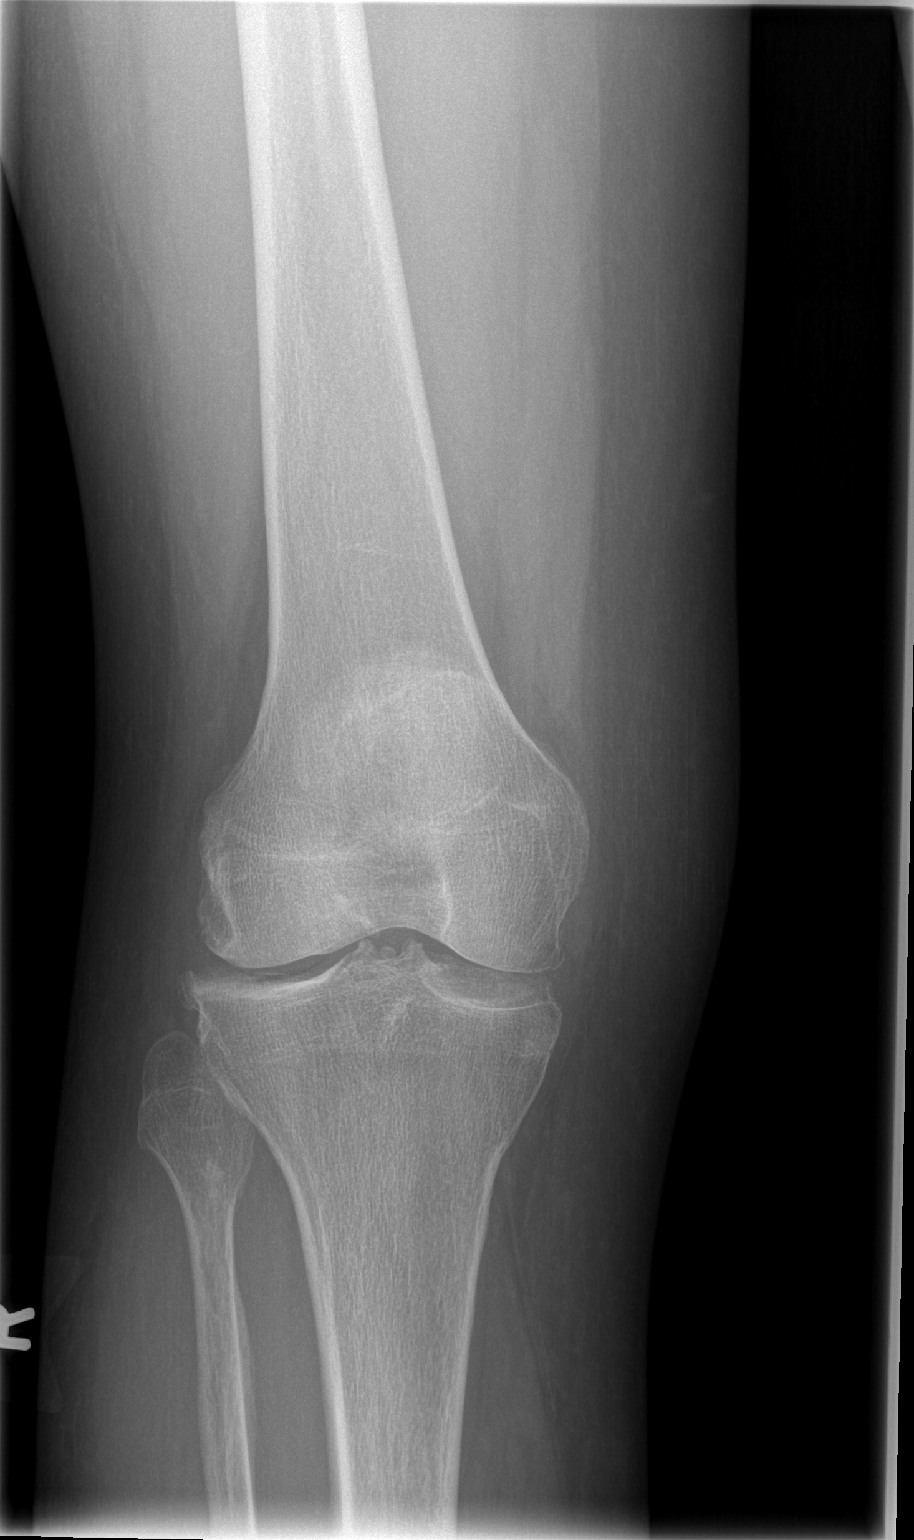

[1 of 1 positions shown; findings below may reference images not displayed]

FINDINGS: Tri-compartmental spurring noted along with a small knee
effusion in the suprapatellar bursa.  There is loss of articular
space.  There is an equivocal appearance for chondrocalcinosis.

No fracture identified.
IMPRESSION: 1.  Tri-compartmental osteoarthritis.
2.  Small knee effusion.
3.  Equivocal appearance for chondrocalcinosis.
4.  No fracture identified.

## 2012-12-28 ENCOUNTER — Other Ambulatory Visit: Payer: Self-pay | Admitting: *Deleted

## 2012-12-28 DIAGNOSIS — C50919 Malignant neoplasm of unspecified site of unspecified female breast: Secondary | ICD-10-CM

## 2012-12-28 MED ORDER — VENLAFAXINE HCL 75 MG PO TABS: 75.0000 mg | ORAL_TABLET | Freq: Every day | ORAL | Status: DC

## 2013-03-09 ENCOUNTER — Telehealth: Payer: Self-pay | Admitting: *Deleted

## 2013-03-09 DIAGNOSIS — C50919 Malignant neoplasm of unspecified site of unspecified female breast: Secondary | ICD-10-CM

## 2013-03-09 MED ORDER — VENLAFAXINE HCL 75 MG PO TABS: 75.0000 mg | ORAL_TABLET | Freq: Every day | ORAL | Status: DC

## 2013-03-09 NOTE — Telephone Encounter (Signed)
Refill had been approved in 12/2012, however, "no print" option was chosen, so this likely was never transferred electronically.

## 2013-03-09 NOTE — Telephone Encounter (Signed)
Received telephone call from patient today stating that she has taken her dose of study medication for today and has four tablets remaining in her current (first) bottle; therefore, she will begin taking from the second bottle of study medication on Monday, August 18th. Patient also had a question about refilling her Effexor prescription - call referred to Triage for follow-up, since Dr. Darnelle Catalan is out of the office this week, and unsure if patient has left a message for Dr. Darrall Dears nurse. Patient stated she left a message for Deanna Artis, however, noted that Deanna Artis is currently in a Breast Cancer Navigator role and not working with a specific physician, following Dr. Renelda Loma departure. Cindy S. Clelia Croft BSN, RN, CCRP 03/09/2013 9:21 AM

## 2013-05-10 ENCOUNTER — Other Ambulatory Visit: Payer: Medicare Other | Admitting: Lab

## 2013-05-17 ENCOUNTER — Ambulatory Visit: Payer: Medicare Other | Admitting: Oncology

## 2013-05-17 ENCOUNTER — Ambulatory Visit: Payer: Medicare Other | Admitting: Family

## 2013-05-27 ENCOUNTER — Telehealth: Payer: Self-pay | Admitting: *Deleted

## 2013-05-27 NOTE — Telephone Encounter (Signed)
05/27/2013 Left message on patient's home answering machine to remind her that today's dose of letrozole/placebo should be her final dose of study medication. Requested that patient return all of her study medication pill bottles at the time of her lab appointment on Monday. Requested that patient contact me at [phone] for any further questions. Cindy S. Clelia Croft BSN, RN, CCRP 05/27/2013 2:15 PM

## 2013-05-30 ENCOUNTER — Encounter: Payer: BC Managed Care – PPO | Admitting: *Deleted

## 2013-05-30 ENCOUNTER — Other Ambulatory Visit (HOSPITAL_BASED_OUTPATIENT_CLINIC_OR_DEPARTMENT_OTHER): Payer: Medicare Other

## 2013-05-30 DIAGNOSIS — C50419 Malignant neoplasm of upper-outer quadrant of unspecified female breast: Secondary | ICD-10-CM

## 2013-05-30 DIAGNOSIS — C50912 Malignant neoplasm of unspecified site of left female breast: Secondary | ICD-10-CM

## 2013-05-30 LAB — CBC WITH DIFFERENTIAL/PLATELET
Basophils Absolute: 0.1 10*3/uL (ref 0.0–0.1)
Eosinophils Absolute: 0.2 10*3/uL (ref 0.0–0.5)
HGB: 12.6 g/dL (ref 11.6–15.9)
MCV: 87.9 fL (ref 79.5–101.0)
MONO#: 0.7 10*3/uL (ref 0.1–0.9)
MONO%: 8.3 % (ref 0.0–14.0)
NEUT#: 5.3 10*3/uL (ref 1.5–6.5)
Platelets: 290 10*3/uL (ref 145–400)
RBC: 4.39 10*6/uL (ref 3.70–5.45)
RDW: 16.3 % — ABNORMAL HIGH (ref 11.2–14.5)
WBC: 8.5 10*3/uL (ref 3.9–10.3)

## 2013-05-30 LAB — COMPREHENSIVE METABOLIC PANEL (CC13)
AST: 26 U/L (ref 5–34)
Alkaline Phosphatase: 88 U/L (ref 40–150)
BUN: 14.4 mg/dL (ref 7.0–26.0)
Calcium: 10.1 mg/dL (ref 8.4–10.4)
Creatinine: 1.1 mg/dL (ref 0.6–1.1)
Glucose: 98 mg/dl (ref 70–140)

## 2013-05-30 NOTE — Progress Notes (Signed)
05/30/2013 Patient in to clinic today for lab appointment prior to next week's physical exam. Patient returned study medication today, having taken her last dose of medication on Friday, October 31st. Patient returned bottles #17 and #19 empty. Partial bottle #20 returned to pharmacist [facility] for pill count, along with empty bottles for destruction. Patient states she has missed no doses of study medication since her last visit.  Patient reports the following health events: - scoliosis diagnosed on x-ray 01/03/2013 by Dr. Jerl Santos. - planned right total knee replacement scheduled for the second Tuesday in January 2015, due to continued pain following multiple procedures.  Patient also reports that she has just returned from a trip to [place], September 2nd through 16th. She has received a postcard from [facility] regarding the EVIDENCE vitamin D study. Since she is considering participation, will follow-up to see if this is allowed while in the follow-up phase for the NSABP B-42 study.  Patient will return next week to discuss subsequent care at the end of NSABP B-42 study participation.  Cindy S. Clelia Croft BSN, RN, CCRP 05/30/2013 11:32 AM  05/30/2013 Per pharmacist count, patient returned 34 tablets in bottle #20. Cindy S. Clelia Croft BSN, RN, CCRP 05/30/2013 3:15 PM

## 2013-06-06 ENCOUNTER — Encounter: Payer: Self-pay | Admitting: *Deleted

## 2013-06-06 ENCOUNTER — Encounter: Payer: BC Managed Care – PPO | Admitting: *Deleted

## 2013-06-06 ENCOUNTER — Other Ambulatory Visit: Payer: Medicare Other | Admitting: Lab

## 2013-06-06 ENCOUNTER — Ambulatory Visit (HOSPITAL_BASED_OUTPATIENT_CLINIC_OR_DEPARTMENT_OTHER): Payer: BC Managed Care – PPO | Admitting: Oncology

## 2013-06-06 VITALS — BP 142/87 | HR 80 | Temp 98.9°F | Resp 20 | Ht 63.5 in | Wt 168.7 lb

## 2013-06-06 DIAGNOSIS — N899 Noninflammatory disorder of vagina, unspecified: Secondary | ICD-10-CM

## 2013-06-06 DIAGNOSIS — Z853 Personal history of malignant neoplasm of breast: Secondary | ICD-10-CM

## 2013-06-06 DIAGNOSIS — C50912 Malignant neoplasm of unspecified site of left female breast: Secondary | ICD-10-CM

## 2013-06-06 NOTE — Progress Notes (Signed)
ID: Samantha Ali OB: 06/14/1946  MR#: [id number]  WUJ#:[id number]  PCP: Neldon Labella, MD GYN:   SU:  OTHER MD: Marcene Corning  CHIEF COMPLAINT:  HISTORY OF PRESENT ILLNESS: From Dr. Theron Arista Rubin's intake nodes 03/20/2003:  "This woman has been in good health all her life. She undergoes routine screening mammography on a yearly basis. She had a mammogram on February 01, 2003, at the [facility] which showed a nonpalpable abnormality on mammogram. Ultrasound showed a hypoechoic mass at the 3 o'clock position, approximately 3.0-cm from the nipple. This measured 5.0 x 6.0 x 5.0-mm. A biopsy was performed on February 15, 2003. Biopsy showed a mammary type carcinoma of low intermediate grade. No lymphovascular invasion was seen. The tumor was strongly ER/PR positive. The proliferative index was 5%. HER-2 neu negative. Samantha Ali underwent a lumpectomy and sentinel lymph node evaluation on February 21, 2003. The final pathology showed this to be a 1.1 x 0.9-cm, grade I, invasive carcinoma with lobular features. No lymphovascular invasion was seen. Five sentinel lymph nodes were evaluated all of which were negative for metastatic disease."  Her subsequent history is as detailed below  INTERVAL HISTORY: Samantha Ali returns today for followup of her breast cancer. Interval history is significant for her having completed 5 years on her second study, NSABP B. 42. She stopped the letrozole 05/27/2013.  REVIEW OF SYSTEMS: She tolerated the antiestrogen C. generally well. She did have problems with hot flashes and these have not improved now 10 days' off treatment. She continues to take clonidine twice daily. A second problem is vaginal dryness. She has not found a good solution for that. When she never did develop was arthralgias or myalgias related to the treatment. Aside from these problems a detailed review of systems was entirely noncontributory today. She exercises by walking her dog. She is not a member of the  gym.  PAST MEDICAL HISTORY: Past Medical History  Diagnosis Date  . Cancer   . Breast cancer   . Clot   . GERD (gastroesophageal reflux disease)   . DVT (deep venous thrombosis) 1990's    probably related to positive phlebitis  She has a possible history of GERD. History of a previous deep vein thrombosis in the right leg in the 1990's that was probably related to positive phlebitis. Previous lumpectomy in 1987 of the right breast for benign disease. Tubal ligation many years ago. She has no other chronic medical problems.   PAST SURGICAL HISTORY: Past Surgical History  Procedure Laterality Date  . Knee surgery    . Shoulder surgery    . Breast surgery  1987    right lumpectomy for benign disease  . Tubal ligation    . Breast lumpectomy  02/21/2003  . Sentinel lymph node biopsy  02/21/2003    FAMILY HISTORY Family History  Problem Relation Age of Onset  . Heart disease Father   Mother had breast cancer and underwent  underwent a right mastectomy in 1977 and recently underwent a contralateral lumpectomy at age 41. She has not had any other therapy. Father died of complications of Alzheimer's and myocardial infarction. The patient is an only child.   GYNECOLOGIC HISTORY:  She had menarche between ages 34 and 58. Menopause 2002. Gravida 1, para 1. She has no history of breast feeding. She was on Premarin for several years then  started Femring but this was stopped at the time of diagnosis .  SOCIAL HISTORY:  Samantha Ali worked for [employer] 43 years. Her husband  of 48 years, Samantha Ali, is  Estate manager/land agent. They have a son, Samantha Ali, who works as an Personnel officer. The patient has no grandchildren. She attends [employer]    ADVANCED DIRECTIVES: Not in place   HEALTH MAINTENANCE: History  Substance Use Topics  . Smoking status: Never Smoker   . Smokeless tobacco: Never Used  . Alcohol Use: No     Colonoscopy:  PAP:  Bone density:  Lipid panel:  No Known  Allergies  Current Outpatient Prescriptions  Medication Sig Dispense Refill  . aspirin 81 MG EC tablet Take 81 mg by mouth daily.       Marland Kitchen atorvastatin (LIPITOR) 10 MG tablet Take 10 mg by mouth daily.       . Cholecalciferol (VITAMIN D3) 1000 UNITS CAPS Take 1 tablet by mouth 2 (two) times daily.       . cloNIDine (CATAPRES) 0.2 MG tablet TAKE 1/2 TAB EVERY MORNING AND 1/2 TAB EVERY EVENING.  90 tablet  3  . esomeprazole (NEXIUM) 20 MG capsule Take 20 mg by mouth at bedtime.       . Investigational letrozole/placebo tablet NSABP B-42 Take 1 tablet by mouth daily.  220 tablet  0  . magnesium oxide (MAG-OX) 400 MG tablet Take 400 mg by mouth daily.      . meloxicam (MOBIC) 15 MG tablet Take 15 mg by mouth daily.        Marland Kitchen omeprazole (PRILOSEC) 10 MG capsule Take 10 mg by mouth 2 (two) times daily.       . traZODone (DESYREL) 50 MG tablet Take 25 mg by mouth at bedtime.       Marland Kitchen venlafaxine (EFFEXOR) 75 MG tablet Take 1 tablet (75 mg total) by mouth daily.  90 tablet  3   No current facility-administered medications for this visit.    OBJECTIVE: Middle-aged white woman who appears stated age  67 Vitals:   06/06/13 1042  BP: 142/87  Pulse: 80  Temp: 98.9 F (37.2 C)  Resp: 20     Body mass index is 29.41 kg/(m^2).    ECOG FS:0 - Asymptomatic  Ocular: Sclerae unicteric, pupils equal, round and reactive to light Ear-nose-throat: Oropharynx clear, dentition fair Lymphatic: No cervical or supraclavicular adenopathy Lungs no rales or rhonchi, good excursion bilaterally Heart regular rate and rhythm, no murmur appreciated Abd soft, nontender, positive bowel sounds MSK  significant kyphosis and scoliosis but no focal spinal tenderness, no joint edema Neuro: non-focal, well-oriented,  playful  affect Breasts: The right breast is unremarkable. The left breast is status post lumpectomy and radiation. There is no evidence of local recurrence. The left axilla is benign.   LAB  RESULTS:  CMP     Component Value Date/Time   NA 143 05/30/2013 1040   NA 141 06/06/2011 1112   K 4.6 05/30/2013 1040   K 4.3 06/06/2011 1112   CL 105 05/05/2012 1058   CL 102 06/06/2011 1112   CO2 27 05/30/2013 1040   CO2 28 06/06/2011 1112   GLUCOSE 98 05/30/2013 1040   GLUCOSE 93 05/05/2012 1058   GLUCOSE 98 06/06/2011 1112   BUN 14.4 05/30/2013 1040   BUN 15 06/06/2011 1112   CREATININE 1.1 05/30/2013 1040   CREATININE 1.23* 06/06/2011 1112   CALCIUM 10.1 05/30/2013 1040   CALCIUM 9.2 06/06/2011 1112   PROT 7.7 05/30/2013 1040   PROT 6.8 06/06/2011 1112   ALBUMIN 3.8 05/30/2013 1040   ALBUMIN 3.8 06/06/2011 1112   AST 26  05/30/2013 1040   AST 33 06/06/2011 1112   ALT 22 05/30/2013 1040   ALT 39* 06/06/2011 1112   ALKPHOS 88 05/30/2013 1040   ALKPHOS 78 06/06/2011 1112   BILITOT 0.59 05/30/2013 1040   BILITOT 0.3 06/06/2011 1112   GFRNONAA 69* 05/30/2011 1220   GFRAA 80* 05/30/2011 1220    I No results found for this basename: SPEP, UPEP,  kappa and lambda light chains    Lab Results  Component Value Date   WBC 8.5 05/30/2013   NEUTROABS 5.3 05/30/2013   HGB 12.6 05/30/2013   HCT 38.6 05/30/2013   MCV 87.9 05/30/2013   PLT 290 05/30/2013      Chemistry      Component Value Date/Time   NA 143 05/30/2013 1040   NA 141 06/06/2011 1112   K 4.6 05/30/2013 1040   K 4.3 06/06/2011 1112   CL 105 05/05/2012 1058   CL 102 06/06/2011 1112   CO2 27 05/30/2013 1040   CO2 28 06/06/2011 1112   BUN 14.4 05/30/2013 1040   BUN 15 06/06/2011 1112   CREATININE 1.1 05/30/2013 1040   CREATININE 1.23* 06/06/2011 1112      Component Value Date/Time   CALCIUM 10.1 05/30/2013 1040   CALCIUM 9.2 06/06/2011 1112   ALKPHOS 88 05/30/2013 1040   ALKPHOS 78 06/06/2011 1112   AST 26 05/30/2013 1040   AST 33 06/06/2011 1112   ALT 22 05/30/2013 1040   ALT 39* 06/06/2011 1112   BILITOT 0.59 05/30/2013 1040   BILITOT 0.3 06/06/2011 1112       Lab Results  Component Value Date   LABCA2 23 06/06/2011    No components found  with this basename: [id number]    No results found for this basename: INR,  in the last 168 hours  Urinalysis No results found for this basename: colorurine, appearanceur, labspec, phurine, glucoseu, hgbur, bilirubinur, ketonesur, proteinur, urobilinogen, nitrite, leukocytesur    STUDIES:  most recent mammogram 01/12/2013 unremarkable. Most recent bone density at [facility] 06/27/2011 showed osteopenia with a T score of -1.6  ASSESSMENT: 67 y.o. [place] woman status post left lumpectomy and sentinel lymph node sampling 02/21/2003 for a pT1c pN0, stage IA invasive ductal carcinoma, grade 1, estrogen and progesterone receptor strongly positive, with an MIB-105% and no HER-2 amplification  (1) Completed radiation therapy 05/28/2003  (2) participated in [place] 27 study, and received anastrozole for 5 years  (3) participated in NSABP B 42, receiving either letrozole or placebo, treatment completed 05/27/2013   PLAN: We spent a little over 40 minutes today discussing Liborio Nixon diagnosis, treatment history, and prognosis. Kimimila has completed her 10 years of followup and she also has completed participation in the NSABP B42 study. We are very grateful to her for her participation, and in particular the results of B. 42 will have a major impact on how we treat estrogen receptor positive breast cancer.  I am not comfortable releasing her to her primary care physician. She understands she will continue to need a yearly mammography and yearly breast exam. I will be glad to see her at any point in the future but as of now no further routine appointments have been made for her here.   Lowella Dell, MD   06/06/2013 10:58 AM

## 2013-06-06 NOTE — Progress Notes (Signed)
06/06/2013 Patient in to clinic today for Month 60 evaluation following the end of treatment on the NSABP B-42 research study. Following history and physical by Dr. Darnelle Catalan today, patient care was transferred back to primary care and gynecologic physicians. Patient is aware that she will receive another phone call follow-up from research nurse in approximately six months.  Patient had inquired about possible participation in the Vitamin D EVIDENCE study at [facility], since she received a solicitation postcard in the mail. After reviewing study details on the clinicaltrials.gov website, explained to patient that she might not be eligible for the following reasons: - age range for study is greater than 53 (patient is currently 67 years old, however, postcard mailing indicated patients are eligible at age 66). - screening requires vitamin D levels of 20-30, and patient's vitamin D levels have fallen within the normal range, with the last measurement done in 2012. - patients may be planning to undergo hip or knee surgery in the next year; patient is scheduled for right knee replacement in January. Also noted that if patient did wish to participate at a later date, when eligible, it would be necessary for [facility] research staff to confirm that this is allowed, per the NSABP B-42 study. Patient voiced understanding and appreciated the additional information provided for the EVIDENCE study.  Patient has had no other health problems to report, besides what was reported at the 05/30/13 visit. She has had no hospitalizations or fracture, and no cardiac events. She continues to take Lipitor and is taking no medications for bone density at this time.  Thanked patient for her participation in this trial.  Cindy S. Clelia Croft BSN, RN, CCRP 06/06/2013 1:43 PM

## 2013-06-06 NOTE — Progress Notes (Signed)
Addendum:  The patient has no history of diabetes, high blood pressure, tobacco use, or hypertriglyceridemia. Obesity is mild to moderate. There is no documented history of coronary artery artery disease. The patient's father had coronary artery disease, but did not die from that problem.

## 2013-06-28 NOTE — Progress Notes (Signed)
06/28/2013 Patient care was transferred to Dr. Darnelle Catalan from Dr. Donnie Coffin for the final study visit. Subsequent care will be received through her primary care physician and gynecologist. Lesly Rubenstein. Clelia Croft BSN, RN, CCRP 06/28/2013 3:10 PM

## 2013-06-29 NOTE — Addendum Note (Signed)
Addended by: Bernerd Pho on: 06/29/2013 11:29 AM   Modules accepted: Medications

## 2013-07-19 ENCOUNTER — Other Ambulatory Visit: Payer: Self-pay | Admitting: Orthopaedic Surgery

## 2013-07-26 ENCOUNTER — Encounter (HOSPITAL_COMMUNITY): Payer: Self-pay

## 2013-08-02 NOTE — Progress Notes (Signed)
Addendum: Please note that Samantha Ali has a long history of joint pain. These have been severe enough to require elective surgery, since medical intervention was not effective at controlling her symptoms. She is expected to undergo a total knee replacement in January 2015. I do not believe this represents an adverse event from her cancer treatment.

## 2013-08-04 ENCOUNTER — Encounter (HOSPITAL_COMMUNITY): Payer: Self-pay

## 2013-08-04 ENCOUNTER — Encounter (HOSPITAL_COMMUNITY)
Admission: RE | Admit: 2013-08-04 | Discharge: 2013-08-04 | Disposition: A | Discharge status: Home / Self Care | Payer: Medicare Other | Source: Ambulatory Visit | Attending: Orthopaedic Surgery | Admitting: Orthopaedic Surgery

## 2013-08-04 DIAGNOSIS — Z01818 Encounter for other preprocedural examination: Secondary | ICD-10-CM | POA: Insufficient documentation

## 2013-08-04 DIAGNOSIS — Z01810 Encounter for preprocedural cardiovascular examination: Secondary | ICD-10-CM | POA: Insufficient documentation

## 2013-08-04 DIAGNOSIS — Z01812 Encounter for preprocedural laboratory examination: Secondary | ICD-10-CM | POA: Insufficient documentation

## 2013-08-04 HISTORY — DX: Headache: R51

## 2013-08-04 HISTORY — DX: Visual discomfort, bilateral: H53.143

## 2013-08-04 HISTORY — DX: Unspecified osteoarthritis, unspecified site: M19.90

## 2013-08-04 LAB — CBC WITH DIFFERENTIAL/PLATELET
BASOS ABS: 0.1 10*3/uL (ref 0.0–0.1)
Basophils Relative: 1 % (ref 0–1)
EOS PCT: 5 % (ref 0–5)
Eosinophils Absolute: 0.4 10*3/uL (ref 0.0–0.7)
HCT: 37.7 % (ref 36.0–46.0)
Hemoglobin: 12.4 g/dL (ref 12.0–15.0)
LYMPHS PCT: 29 % (ref 12–46)
Lymphs Abs: 2.4 10*3/uL (ref 0.7–4.0)
MCH: 29.7 pg (ref 26.0–34.0)
MCHC: 32.9 g/dL (ref 30.0–36.0)
MCV: 90.2 fL (ref 78.0–100.0)
Monocytes Absolute: 0.7 10*3/uL (ref 0.1–1.0)
Monocytes Relative: 9 % (ref 3–12)
NEUTROS ABS: 4.6 10*3/uL (ref 1.7–7.7)
Neutrophils Relative %: 57 % (ref 43–77)
PLATELETS: 246 10*3/uL (ref 150–400)
RBC: 4.18 MIL/uL (ref 3.87–5.11)
RDW: 14.4 % (ref 11.5–15.5)
WBC: 8.1 10*3/uL (ref 4.0–10.5)

## 2013-08-04 LAB — URINALYSIS, ROUTINE W REFLEX MICROSCOPIC
BILIRUBIN URINE: NEGATIVE
GLUCOSE, UA: NEGATIVE mg/dL
Hgb urine dipstick: NEGATIVE
Ketones, ur: NEGATIVE mg/dL
Leukocytes, UA: NEGATIVE
Nitrite: NEGATIVE
Protein, ur: NEGATIVE mg/dL
Specific Gravity, Urine: 1.023 (ref 1.005–1.030)
UROBILINOGEN UA: 0.2 mg/dL (ref 0.0–1.0)
pH: 7.5 (ref 5.0–8.0)

## 2013-08-04 LAB — PROTIME-INR
INR: 0.96 (ref 0.00–1.49)
Prothrombin Time: 12.6 seconds (ref 11.6–15.2)

## 2013-08-04 LAB — APTT: APTT: 29 s (ref 24–37)

## 2013-08-04 LAB — BASIC METABOLIC PANEL
BUN: 14 mg/dL (ref 6–23)
CALCIUM: 9.1 mg/dL (ref 8.4–10.5)
CO2: 27 mEq/L (ref 19–32)
CREATININE: 1 mg/dL (ref 0.50–1.10)
Chloride: 100 mEq/L (ref 96–112)
GFR calc non Af Amer: 57 mL/min — ABNORMAL LOW (ref >90)
GFR, EST AFRICAN AMERICAN: 66 mL/min — AB (ref >90)
Glucose, Bld: 96 mg/dL (ref 70–99)
Potassium: 4.4 mEq/L (ref 3.7–5.3)
Sodium: 139 mEq/L (ref 137–147)

## 2013-08-04 LAB — TYPE AND SCREEN
ABO/RH(D): O POS
ANTIBODY SCREEN: NEGATIVE

## 2013-08-04 LAB — ABO/RH: ABO/RH(D): O POS

## 2013-08-04 LAB — SURGICAL PCR SCREEN
MRSA, PCR: NEGATIVE
Staphylococcus aureus: POSITIVE — AB

## 2013-08-04 NOTE — Progress Notes (Signed)
Pt. Followed by Dr. Mare Ferrari on occas., - last seen 2014- stress test done & wnl, otherwise PCP- L. Sabra Heck at [facility]. Pt. denies SOB, chest pain, any changes in breathing.

## 2013-08-04 NOTE — Progress Notes (Signed)
Pt. Followed by Dr. Garlon Hatchet at [facility] office, also saw Dr,. Brackbill one yr. Ago followed by a stress test that was wnl.

## 2013-08-04 NOTE — Pre-Procedure Instructions (Signed)
CIPRIANA BILLER  08/04/2013   Your procedure is scheduled on:  08/09/2013- TUESDAY  Report to [place]  2 * 3   ENTRANCE [facility] at 5:30  AM.  Call this number if you have problems the morning of surgery: [phone]   Remember:   Do not eat food or drink liquids after midnight. On Monday   Take these medicines the morning of surgery with A SIP OF WATER: Effexor, Tramadol, Prilosec, Clonidine, Hydrocodone    Do not wear jewelry, make-up or nail polish.  Do not wear lotions, powders, or perfumes. You may wear deodorant.  Do not shave 48 hours prior to surgery.  Do not bring valuables to the hospital.  [facility] is not responsible                  for any belongings or valuables.               Contacts, dentures or bridgework may not be worn into surgery.  Leave suitcase in the car. After surgery it may be brought to your room.  For patients admitted to the hospital, discharge time is determined by your                treatment team.               Patients discharged the day of surgery will not be allowed to drive  home.  Name and phone number of your driver: with family  Special Instructions: Shower using CHG 2 nights before surgery and the night before surgery.  If you shower the day of surgery use CHG.  Use special wash - you have one bottle of CHG for all showers.  You should use approximately 1/3 of the bottle for each shower.   Please read over the following fact sheets that you were given: Pain Booklet, Coughing and Deep Breathing, Blood Transfusion Information, MRSA Information and Surgical Site Infection Prevention

## 2013-08-05 NOTE — H&P (Signed)
TOTAL KNEE ADMISSION H&P  Patient is being admitted for right total knee arthroplasty.  Subjective:  Chief Complaint:right knee pain.  HPI: Samantha Ali, 68 y.o. female, has a history of pain and functional disability in the right knee due to arthritis and has failed non-surgical conservative treatments for greater than 12 weeks to includeNSAID's and/or analgesics, corticosteriod injections, flexibility and strengthening excercises, supervised PT with diminished ADL's post treatment, use of assistive devices and activity modification.  Onset of symptoms was gradual, starting 7 years ago with gradually worsening course since that time. The patient noted prior procedures on the knee to include  arthroscopy on the right knee(s).  Patient currently rates pain in the right knee(s) at 8 out of 10 with activity. Patient has night pain, worsening of pain with activity and weight bearing, pain that interferes with activities of daily living, pain with passive range of motion and crepitus.  Patient has evidence of subchondral sclerosis, periarticular osteophytes and joint space narrowing by imaging studies. This patient has had 2 previous knee arthroscopies.. There is no active infection.  Patient Active Problem List   Diagnosis Date Noted  . Chest pain 08/04/2012  . Palpitations 08/04/2012  . Septic joint of right knee joint 06/01/2011  . Breast cancer 05/29/2011   Past Medical History  Diagnosis Date  . Cancer   . Breast cancer   . Clot 1996    in the right leg- in the muscle    . GERD (gastroesophageal reflux disease)   . DVT (deep venous thrombosis) 1990's    probably related to positive phlebitis  . Headache(784.0)     migraines- most often 2-3 times /week   . Arthritis     R knee  . Photophobia of both eyes     unknown etiology    Past Surgical History  Procedure Laterality Date  . Knee surgery    . Shoulder surgery    . Tubal ligation    . Sentinel lymph node biopsy  02/21/2003   . Breast surgery  1987    right lumpectomy for benign disease  . Breast lumpectomy  02/21/2003  . Mastectomy Left 2004    L parial mastectomy  . Tonsillectomy    . Eye surgery      cataracts removed -IOL    No prescriptions prior to admission   No Known Allergies  History  Substance Use Topics  . Smoking status: Never Smoker   . Smokeless tobacco: Never Used  . Alcohol Use: No    Family History  Problem Relation Age of Onset  . Heart disease Father      Review of Systems  Constitutional: Negative.   HENT: Negative.   Eyes: Negative.   Respiratory: Negative.   Cardiovascular: Negative.   Gastrointestinal: Negative.   Genitourinary: Negative.   Musculoskeletal: Positive for joint pain.  Skin: Negative.   Neurological: Negative.   Endo/Heme/Allergies: Negative.   Psychiatric/Behavioral: Negative.     Objective:  Physical Exam  Constitutional: She appears well-developed.  HENT:  Head: Normocephalic.  Eyes: Pupils are equal, round, and reactive to light.  Neck: Normal range of motion.  Cardiovascular: Normal rate.   Respiratory: Effort normal.  GI: Soft.  Musculoskeletal:  Right knee exam: Mild valgus deformity.  Range of motion 5-9 5.  Crepitation but no fluid on her knee.  Pain at the medial joint line.  Normal neurovascular status.  Neurological: She is alert.  Skin: Skin is warm.  Psychiatric: She has a normal mood  and affect.    Vital signs in last 24 hours: Temp:  [98.9 F (37.2 C)] 98.9 F (37.2 C) (01/08 1253) Pulse Rate:  [88] 88 (01/08 1253) Resp:  [18] 18 (01/08 1253) BP: (152)/(89) 152/89 mmHg (01/08 1253) SpO2:  [97 %] 97 % (01/08 1253) Weight:  [77.599 kg (171 lb 1.2 oz)] 77.599 kg (171 lb 1.2 oz) (01/08 1253)  Labs:   Estimated body mass index is 29.41 kg/(m^2) as calculated from the following:   Height as of 06/06/13: 5' 3.5" (1.613 m).   Weight as of 06/06/13: 76.522 kg (168 lb 11.2 oz).   Imaging Review Plain radiographs  demonstrate severe degenerative joint disease of the right knee(s). The overall alignment ismild valgus. The bone quality appears to be excellent for age and reported activity level.  Assessment/Plan:  End stage arthritis, right knee   The patient history, physical examination, clinical judgment of the provider and imaging studies are consistent with end stage degenerative joint disease of the right knee(s) and total knee arthroplasty is deemed medically necessary. The treatment options including medical management, injection therapy arthroscopy and arthroplasty were discussed at length. The risks and benefits of total knee arthroplasty were presented and reviewed. The risks due to aseptic loosening, infection, stiffness, patella tracking problems, thromboembolic complications and other imponderables were discussed. The patient acknowledged the explanation, agreed to proceed with the plan and consent was signed. Patient is being admitted for inpatient treatment for surgery, pain control, PT, OT, prophylactic antibiotics, VTE prophylaxis, progressive ambulation and ADL's and discharge planning. The patient is planning to be discharged home with home health services

## 2013-08-08 MED ORDER — CEFAZOLIN SODIUM-DEXTROSE 2-3 GM-% IV SOLR
2.0000 g | INTRAVENOUS | Status: AC
  Administered 2013-08-09: 2 g via INTRAVENOUS
  Filled 2013-08-08: qty 50

## 2013-08-08 MED ORDER — CHLORHEXIDINE GLUCONATE 4 % EX LIQD: 60.0000 mL | Freq: Once | CUTANEOUS | Status: DC

## 2013-08-09 ENCOUNTER — Encounter (HOSPITAL_COMMUNITY): Payer: Medicare Other | Admitting: Anesthesiology

## 2013-08-09 ENCOUNTER — Encounter (HOSPITAL_COMMUNITY): Payer: Self-pay | Admitting: *Deleted

## 2013-08-09 ENCOUNTER — Inpatient Hospital Stay (HOSPITAL_COMMUNITY): Payer: Medicare Other | Admitting: Anesthesiology

## 2013-08-09 ENCOUNTER — Inpatient Hospital Stay (HOSPITAL_COMMUNITY)
Admission: RE | Admit: 2013-08-09 | Discharge: 2013-08-12 | DRG: 470 | Disposition: A | Discharge status: Home Health | Payer: Medicare Other | Source: Ambulatory Visit | Attending: Orthopaedic Surgery | Admitting: Orthopaedic Surgery

## 2013-08-09 ENCOUNTER — Encounter (HOSPITAL_COMMUNITY)
Admission: RE | Disposition: A | Discharge status: Home Health | Payer: Self-pay | Source: Ambulatory Visit | Attending: Orthopaedic Surgery

## 2013-08-09 DIAGNOSIS — G43909 Migraine, unspecified, not intractable, without status migrainosus: Secondary | ICD-10-CM | POA: Diagnosis present

## 2013-08-09 DIAGNOSIS — K219 Gastro-esophageal reflux disease without esophagitis: Secondary | ICD-10-CM | POA: Diagnosis present

## 2013-08-09 DIAGNOSIS — Z96659 Presence of unspecified artificial knee joint: Secondary | ICD-10-CM

## 2013-08-09 DIAGNOSIS — Z86718 Personal history of other venous thrombosis and embolism: Secondary | ICD-10-CM

## 2013-08-09 DIAGNOSIS — Z9010 Acquired absence of unspecified breast and nipple: Secondary | ICD-10-CM

## 2013-08-09 DIAGNOSIS — H53149 Visual discomfort, unspecified: Secondary | ICD-10-CM | POA: Diagnosis present

## 2013-08-09 DIAGNOSIS — M1710 Unilateral primary osteoarthritis, unspecified knee: Principal | ICD-10-CM | POA: Diagnosis present

## 2013-08-09 DIAGNOSIS — M1711 Unilateral primary osteoarthritis, right knee: Secondary | ICD-10-CM

## 2013-08-09 DIAGNOSIS — Z9851 Tubal ligation status: Secondary | ICD-10-CM

## 2013-08-09 DIAGNOSIS — Z7982 Long term (current) use of aspirin: Secondary | ICD-10-CM

## 2013-08-09 DIAGNOSIS — Z853 Personal history of malignant neoplasm of breast: Secondary | ICD-10-CM

## 2013-08-09 DIAGNOSIS — Z79899 Other long term (current) drug therapy: Secondary | ICD-10-CM

## 2013-08-09 DIAGNOSIS — Z8672 Personal history of thrombophlebitis: Secondary | ICD-10-CM

## 2013-08-09 DIAGNOSIS — Z8249 Family history of ischemic heart disease and other diseases of the circulatory system: Secondary | ICD-10-CM

## 2013-08-09 HISTORY — PX: TOTAL KNEE ARTHROPLASTY: SHX125

## 2013-08-09 LAB — GLUCOSE, CAPILLARY
GLUCOSE-CAPILLARY: 137 mg/dL — AB (ref 70–99)
Glucose-Capillary: 132 mg/dL — ABNORMAL HIGH (ref 70–99)

## 2013-08-09 SURGERY — ARTHROPLASTY, KNEE, TOTAL
Anesthesia: Regional | Site: Knee | Laterality: Right

## 2013-08-09 MED ORDER — DEXTROSE IN LACTATED RINGERS 5 % IV SOLN
INTRAVENOUS | Status: DC
  Administered 2013-08-09: 21:00:00 via INTRAVENOUS

## 2013-08-09 MED ORDER — VENLAFAXINE HCL 75 MG PO TABS
75.0000 mg | ORAL_TABLET | Freq: Every day | ORAL | Status: DC
  Administered 2013-08-10 – 2013-08-12 (×3): 75 mg via ORAL
  Filled 2013-08-09 (×3): qty 1

## 2013-08-09 MED ORDER — LACTATED RINGERS IV SOLN
INTRAVENOUS | Status: DC | PRN
  Administered 2013-08-09 (×2): via INTRAVENOUS

## 2013-08-09 MED ORDER — METOCLOPRAMIDE HCL 5 MG/ML IJ SOLN: 5.0000 mg | Freq: Three times a day (TID) | INTRAMUSCULAR | Status: DC | PRN

## 2013-08-09 MED ORDER — MIDAZOLAM HCL 5 MG/5ML IJ SOLN
INTRAMUSCULAR | Status: DC | PRN
  Administered 2013-08-09 (×2): 1 mg via INTRAVENOUS

## 2013-08-09 MED ORDER — HYDROMORPHONE HCL PF 1 MG/ML IJ SOLN
1.0000 mg | INTRAMUSCULAR | Status: DC | PRN
  Administered 2013-08-10 (×3): 1 mg via INTRAVENOUS
  Filled 2013-08-09 (×3): qty 1

## 2013-08-09 MED ORDER — DEXAMETHASONE SODIUM PHOSPHATE 4 MG/ML IJ SOLN
INTRAMUSCULAR | Status: DC | PRN
  Administered 2013-08-09: 4 mg via INTRAVENOUS

## 2013-08-09 MED ORDER — FLUCONAZOLE 150 MG PO TABS
150.0000 mg | ORAL_TABLET | ORAL | Status: DC | PRN
  Filled 2013-08-09: qty 1

## 2013-08-09 MED ORDER — LIDOCAINE HCL (CARDIAC) 20 MG/ML IV SOLN
INTRAVENOUS | Status: DC | PRN
  Administered 2013-08-09: 50 mg via INTRAVENOUS

## 2013-08-09 MED ORDER — OXYCODONE HCL 5 MG/5ML PO SOLN: 5.0000 mg | Freq: Once | ORAL | Status: AC | PRN

## 2013-08-09 MED ORDER — ONDANSETRON HCL 4 MG PO TABS: 4.0000 mg | ORAL_TABLET | Freq: Four times a day (QID) | ORAL | Status: DC | PRN

## 2013-08-09 MED ORDER — PROPOFOL 10 MG/ML IV BOLUS
INTRAVENOUS | Status: DC | PRN
  Administered 2013-08-09: 150 mg via INTRAVENOUS
  Administered 2013-08-09: 50 mg via INTRAVENOUS

## 2013-08-09 MED ORDER — CEFAZOLIN SODIUM-DEXTROSE 2-3 GM-% IV SOLR
2.0000 g | Freq: Four times a day (QID) | INTRAVENOUS | Status: AC
  Administered 2013-08-09 (×2): 2 g via INTRAVENOUS
  Filled 2013-08-09 (×2): qty 50

## 2013-08-09 MED ORDER — LACTATED RINGERS IV SOLN: INTRAVENOUS | Status: DC

## 2013-08-09 MED ORDER — ASPIRIN EC 325 MG PO TBEC
325.0000 mg | DELAYED_RELEASE_TABLET | Freq: Two times a day (BID) | ORAL | Status: DC
  Administered 2013-08-09 – 2013-08-12 (×6): 325 mg via ORAL
  Filled 2013-08-09 (×8): qty 1

## 2013-08-09 MED ORDER — TRAZODONE 25 MG HALF TABLET
25.0000 mg | ORAL_TABLET | Freq: Every day | ORAL | Status: DC
  Administered 2013-08-09 – 2013-08-10 (×2): 25 mg via ORAL
  Filled 2013-08-09 (×6): qty 1

## 2013-08-09 MED ORDER — PHENOL 1.4 % MT LIQD: 1.0000 | OROMUCOSAL | Status: DC | PRN

## 2013-08-09 MED ORDER — ONDANSETRON HCL 4 MG/2ML IJ SOLN: 4.0000 mg | Freq: Four times a day (QID) | INTRAMUSCULAR | Status: DC | PRN

## 2013-08-09 MED ORDER — MENTHOL 3 MG MT LOZG: 1.0000 | LOZENGE | OROMUCOSAL | Status: DC | PRN

## 2013-08-09 MED ORDER — SODIUM CHLORIDE 0.9 % IR SOLN
Status: DC | PRN
Start: 2013-08-09 — End: 2013-08-09
  Administered 2013-08-09: 1000 mL

## 2013-08-09 MED ORDER — OXYCODONE HCL 5 MG PO TABS
5.0000 mg | ORAL_TABLET | Freq: Once | ORAL | Status: AC | PRN
  Administered 2013-08-09: 5 mg via ORAL

## 2013-08-09 MED ORDER — MAGNESIUM OXIDE 400 (241.3 MG) MG PO TABS
200.0000 mg | ORAL_TABLET | Freq: Two times a day (BID) | ORAL | Status: DC
  Administered 2013-08-09 – 2013-08-12 (×6): 200 mg via ORAL
  Filled 2013-08-09 (×7): qty 0.5

## 2013-08-09 MED ORDER — ALUM & MAG HYDROXIDE-SIMETH 200-200-20 MG/5ML PO SUSP: 30.0000 mL | ORAL | Status: DC | PRN

## 2013-08-09 MED ORDER — HYDROCODONE-ACETAMINOPHEN 5-325 MG PO TABS
1.0000 | ORAL_TABLET | ORAL | Status: DC | PRN
  Administered 2013-08-09 (×2): 2 via ORAL
  Administered 2013-08-10 – 2013-08-11 (×4): 1 via ORAL
  Administered 2013-08-11: 2 via ORAL
  Administered 2013-08-11 (×2): 1 via ORAL
  Administered 2013-08-11: 2 via ORAL
  Administered 2013-08-12 (×3): 1 via ORAL
  Filled 2013-08-09 (×3): qty 1
  Filled 2013-08-09 (×2): qty 2
  Filled 2013-08-09 (×2): qty 1
  Filled 2013-08-09 (×2): qty 2
  Filled 2013-08-09: qty 1
  Filled 2013-08-09: qty 2
  Filled 2013-08-09 (×2): qty 1
  Filled 2013-08-09: qty 2
  Filled 2013-08-09: qty 1

## 2013-08-09 MED ORDER — ATORVASTATIN CALCIUM 10 MG PO TABS
10.0000 mg | ORAL_TABLET | Freq: Every day | ORAL | Status: DC
  Administered 2013-08-09 – 2013-08-11 (×3): 10 mg via ORAL
  Filled 2013-08-09 (×4): qty 1

## 2013-08-09 MED ORDER — FENTANYL CITRATE 0.05 MG/ML IJ SOLN
INTRAMUSCULAR | Status: DC | PRN
  Administered 2013-08-09 (×3): 50 ug via INTRAVENOUS

## 2013-08-09 MED ORDER — CLONIDINE HCL 0.2 MG PO TABS
0.1000 mg | ORAL_TABLET | Freq: Every day | ORAL | Status: DC
  Administered 2013-08-11 – 2013-08-12 (×2): 0.1 mg via ORAL
  Filled 2013-08-09 (×3): qty 0.5

## 2013-08-09 MED ORDER — PROMETHAZINE HCL 25 MG/ML IJ SOLN: 6.2500 mg | INTRAMUSCULAR | Status: DC | PRN

## 2013-08-09 MED ORDER — HYDROMORPHONE HCL PF 1 MG/ML IJ SOLN
INTRAMUSCULAR | Status: AC
  Filled 2013-08-09: qty 1

## 2013-08-09 MED ORDER — ONDANSETRON HCL 4 MG/2ML IJ SOLN
INTRAMUSCULAR | Status: DC | PRN
  Administered 2013-08-09: 4 mg via INTRAVENOUS

## 2013-08-09 MED ORDER — ACETAMINOPHEN 650 MG RE SUPP: 650.0000 mg | Freq: Four times a day (QID) | RECTAL | Status: DC | PRN

## 2013-08-09 MED ORDER — OXYCODONE HCL 5 MG PO TABS
ORAL_TABLET | ORAL | Status: AC
  Filled 2013-08-09: qty 1

## 2013-08-09 MED ORDER — ACETAMINOPHEN 325 MG PO TABS: 650.0000 mg | ORAL_TABLET | Freq: Four times a day (QID) | ORAL | Status: DC | PRN

## 2013-08-09 MED ORDER — HYDROMORPHONE HCL PF 1 MG/ML IJ SOLN
0.2500 mg | INTRAMUSCULAR | Status: DC | PRN
  Administered 2013-08-09 (×2): 0.5 mg via INTRAVENOUS

## 2013-08-09 MED ORDER — CLONIDINE HCL 0.2 MG PO TABS
0.2000 mg | ORAL_TABLET | Freq: Every day | ORAL | Status: DC
  Administered 2013-08-09 – 2013-08-11 (×3): 0.2 mg via ORAL
  Filled 2013-08-09 (×4): qty 1

## 2013-08-09 MED ORDER — BUPIVACAINE-EPINEPHRINE PF 0.5-1:200000 % IJ SOLN
INTRAMUSCULAR | Status: DC | PRN
  Administered 2013-08-09: 30 mL via PERINEURAL

## 2013-08-09 MED ORDER — MAGNESIUM 250 MG PO TABS: 250.0000 mg | ORAL_TABLET | Freq: Two times a day (BID) | ORAL | Status: DC

## 2013-08-09 MED ORDER — METOCLOPRAMIDE HCL 10 MG PO TABS
5.0000 mg | ORAL_TABLET | Freq: Three times a day (TID) | ORAL | Status: DC | PRN
Start: 2013-08-09 — End: 2013-08-12

## 2013-08-09 MED ORDER — PANTOPRAZOLE SODIUM 40 MG PO TBEC
40.0000 mg | DELAYED_RELEASE_TABLET | Freq: Every day | ORAL | Status: DC
  Administered 2013-08-09 – 2013-08-12 (×4): 40 mg via ORAL
  Filled 2013-08-09 (×4): qty 1

## 2013-08-09 SURGICAL SUPPLY — 65 items
BANDAGE ELASTIC 4 VELCRO ST LF (GAUZE/BANDAGES/DRESSINGS) ×2 IMPLANT
BANDAGE ELASTIC 6 VELCRO ST LF (GAUZE/BANDAGES/DRESSINGS) ×2 IMPLANT
BANDAGE ESMARK 6X9 LF (GAUZE/BANDAGES/DRESSINGS) ×1 IMPLANT
BANDAGE GAUZE ELAST BULKY 4 IN (GAUZE/BANDAGES/DRESSINGS) ×2 IMPLANT
BLADE SAGITTAL 25.0X1.19X90 (BLADE) ×2 IMPLANT
BLADE SURG ROTATE 9660 (MISCELLANEOUS) IMPLANT
BNDG ELASTIC 6X10 VLCR STRL LF (GAUZE/BANDAGES/DRESSINGS) ×2 IMPLANT
BNDG ESMARK 6X9 LF (GAUZE/BANDAGES/DRESSINGS) ×2
BOWL SMART MIX CTS (DISPOSABLE) ×2 IMPLANT
CAPT RP KNEE ×2 IMPLANT
CEMENT HV SMART SET (Cement) ×4 IMPLANT
CLOTH BEACON ORANGE TIMEOUT ST (SAFETY) ×2 IMPLANT
COVER SURGICAL LIGHT HANDLE (MISCELLANEOUS) ×2 IMPLANT
CUFF TOURNIQUET SINGLE 34IN LL (TOURNIQUET CUFF) ×2 IMPLANT
CUFF TOURNIQUET SINGLE 44IN (TOURNIQUET CUFF) IMPLANT
DRAPE EXTREMITY T 121X128X90 (DRAPE) ×2 IMPLANT
DRAPE PROXIMA HALF (DRAPES) ×2 IMPLANT
DRAPE U-SHAPE 47X51 STRL (DRAPES) ×2 IMPLANT
DRSG ADAPTIC 3X8 NADH LF (GAUZE/BANDAGES/DRESSINGS) ×2 IMPLANT
DRSG PAD ABDOMINAL 8X10 ST (GAUZE/BANDAGES/DRESSINGS) ×2 IMPLANT
DURAPREP 26ML APPLICATOR (WOUND CARE) ×2 IMPLANT
ELECT REM PT RETURN 9FT ADLT (ELECTROSURGICAL) ×2
ELECTRODE REM PT RTRN 9FT ADLT (ELECTROSURGICAL) ×1 IMPLANT
FACESHIELD LNG OPTICON STERILE (SAFETY) ×4 IMPLANT
GLOVE BIO SURGEON STRL SZ8.5 (GLOVE) ×2 IMPLANT
GLOVE BIOGEL PI IND STRL 8 (GLOVE) ×1 IMPLANT
GLOVE BIOGEL PI IND STRL 8.5 (GLOVE) ×1 IMPLANT
GLOVE BIOGEL PI INDICATOR 8 (GLOVE) ×1
GLOVE BIOGEL PI INDICATOR 8.5 (GLOVE) ×1
GLOVE SS BIOGEL STRL SZ 8 (GLOVE) ×1 IMPLANT
GLOVE SUPERSENSE BIOGEL SZ 8 (GLOVE) ×1
GOWN PREVENTION PLUS XLARGE (GOWN DISPOSABLE) ×2 IMPLANT
GOWN STRL NON-REIN LRG LVL3 (GOWN DISPOSABLE) ×2 IMPLANT
GOWN STRL REUS W/TWL 2XL LVL3 (GOWN DISPOSABLE) ×2 IMPLANT
HANDPIECE INTERPULSE COAX TIP (DISPOSABLE) ×1
HOOD PEEL AWAY FACE SHEILD DIS (HOOD) ×2 IMPLANT
IMMOBILIZER KNEE 20 (SOFTGOODS)
IMMOBILIZER KNEE 20 THIGH 36 (SOFTGOODS) IMPLANT
IMMOBILIZER KNEE 22 UNIV (SOFTGOODS) ×2 IMPLANT
IMMOBILIZER KNEE 24 THIGH 36 (MISCELLANEOUS) IMPLANT
IMMOBILIZER KNEE 24 UNIV (MISCELLANEOUS)
KIT BASIN OR (CUSTOM PROCEDURE TRAY) ×2 IMPLANT
KIT ROOM TURNOVER OR (KITS) ×2 IMPLANT
MANIFOLD NEPTUNE II (INSTRUMENTS) ×2 IMPLANT
NEEDLE HYPO 21X1 ECLIPSE (NEEDLE) ×2 IMPLANT
NS IRRIG 1000ML POUR BTL (IV SOLUTION) ×2 IMPLANT
PACK TOTAL JOINT (CUSTOM PROCEDURE TRAY) ×2 IMPLANT
PAD ABD 8X10 STRL (GAUZE/BANDAGES/DRESSINGS) ×2 IMPLANT
PAD ARMBOARD 7.5X6 YLW CONV (MISCELLANEOUS) ×4 IMPLANT
SET HNDPC FAN SPRY TIP SCT (DISPOSABLE) ×1 IMPLANT
SPONGE GAUZE 4X4 12PLY (GAUZE/BANDAGES/DRESSINGS) ×2 IMPLANT
SPONGE GAUZE 4X4 12PLY STER LF (GAUZE/BANDAGES/DRESSINGS) ×2 IMPLANT
STAPLER VISISTAT 35W (STAPLE) IMPLANT
SUCTION FRAZIER TIP 10 FR DISP (SUCTIONS) IMPLANT
SUT MNCRL AB 3-0 PS2 18 (SUTURE) IMPLANT
SUT VIC AB 0 CT1 27 (SUTURE) ×2
SUT VIC AB 0 CT1 27XBRD ANBCTR (SUTURE) ×2 IMPLANT
SUT VIC AB 2-0 CT1 27 (SUTURE) ×2
SUT VIC AB 2-0 CT1 TAPERPNT 27 (SUTURE) ×2 IMPLANT
SUT VLOC 180 0 24IN GS25 (SUTURE) ×2 IMPLANT
SYR 50ML LL SCALE MARK (SYRINGE) ×2 IMPLANT
TOWEL OR 17X24 6PK STRL BLUE (TOWEL DISPOSABLE) ×2 IMPLANT
TOWEL OR 17X26 10 PK STRL BLUE (TOWEL DISPOSABLE) ×2 IMPLANT
TRAY FOLEY CATH 14FR (SET/KITS/TRAYS/PACK) ×2 IMPLANT
WATER STERILE IRR 1000ML POUR (IV SOLUTION) ×4 IMPLANT

## 2013-08-09 NOTE — Op Note (Signed)
PREOP DIAGNOSIS: DJD RIGHT KNEE POSTOP DIAGNOSIS: same PROCEDURE: RIGHT TKR ANESTHESIA: General and block ATTENDING SURGEON: Rector Devonshire G ASSISTANT: Randall Godette OPA and Laure Kidney RNFA  INDICATIONS FOR PROCEDURE:Samantha Ali is a 68 y.o. female who has struggled for a long time with pain due to degenerative arthritis of the right knee.  The patient has failed many conservative non-operative measures and at this point has pain which limits the ability to sleep and walk.  The patient is offered total knee replacement.  Informed operative consent was obtained after discussion of possible risks of anesthesia, infection, neurovascular injury, DVT, and death.  The importance of the post-operative rehabilitation protocol to optimize result was stressed extensively with the patient.  SUMMARY OF FINDINGS AND PROCEDURE:  Samantha Ali was taken to the operative suite where under the above anesthesia a right knee replacement was performed.  There were advanced degenerative changes and the bone quality was good.  We used the DePuy system and placed size standard femur, 3 tibia, 38 mm all polyethylene patella, and a size 10 mm spacer.  The patient was admitted for appropriate post-op care to include perioperative antibiotics and mechanical and pharmacologic measures for DVT prophylaxis.  DESCRIPTION OF PROCEDURE:  Samantha Ali was taken to the operative suite where the above anesthesia was applied.  The patient was positioned supine and prepped and draped in normal sterile fashion.  An appropriate time out was performed.  After the administration of Kefzol pre-op antibiotic the leg was elevated and exsanguinated and a tourniquet inflated. A standard longitudinal incision was made on the anterior knee.  Dissection was carried down to the extensor mechanism.  All appropriate anti-infective measures were used including the pre-operative antibiotic, betadine impregnated drape, and closed hooded exhaust  systems for each member of the surgical team.  A medial parapatellar incision was made in the extensor mechanism and the knee cap flipped and the knee flexed.  Some residual meniscal tissues were removed along with any remaining ACL/PCL tissue.  A guide was placed on the tibia and a flat cut was made on it's superior surface.  An intramedullary guide was placed in the femur and was utilized to make anterior and posterior cuts creating an appropriate flexion gap.  A second intramedullary guide was placed in the femur to make a distal cut properly balancing the knee with an extension gap equal to the flexion gap.  The three bones sized to the above mentioned sizes and the appropriate guides were placed and utilized.  A trial reduction was done and the knee easily came to full extension and the patella tracked well on flexion.  The trial components were removed and all bones were cleaned with pulsatile lavage and then dried thoroughly.  Cement was mixed and was pressurized onto the bones followed by placement of the aforementioned components.  Excess cement was trimmed and pressure was held on the components until the cement had hardened.  The tourniquet was deflated and a small amount of bleeding was controlled with cautery and pressure.  The knee was irrigated thoroughly.  The extensor mechanism was re-approximated with V-loc suture in running fashion.  The knee was flexed and the repair was solid.  The subcutaneous tissues were re-approximated with #0 and #2-0 vicryl and the skin closed with a subcuticular stitch and steristrips.  A sterile dressing was applied.  Intraoperative fluids, EBL, and tourniquet time can be obtained from anesthesia records.  DISPOSITION:  The patient was taken to recovery room in  stable condition and admitted for appropriate post-op care to include peri-operative antibiotic and DVT prophylaxis with mechanical and pharmacologic measures.  Samantha Ali G 08/09/2013, 9:11 AM

## 2013-08-09 NOTE — Interval H&P Note (Signed)
History and Physical Interval Note:  08/09/2013 7:18 AM  Samantha Ali  has presented today for surgery, with the diagnosis of RIGHT KNEE DEGENER  The various methods of treatment have been discussed with the patient and family. After consideration of risks, benefits and other options for treatment, the patient has consented to  Procedure(s): TOTAL KNEE ARTHROPLASTY (Right) as a surgical intervention .  The patient's history has been reviewed, patient examined, no change in status, stable for surgery.  I have reviewed the patient's chart and labs.  Questions were answered to the patient's satisfaction.     Richardo Popoff G

## 2013-08-09 NOTE — Progress Notes (Signed)
Care of pt assumed by MA Preslea Rhodus RN 

## 2013-08-09 NOTE — Transfer of Care (Signed)
Immediate Anesthesia Transfer of Care Note  Patient: Samantha Ali  Procedure(s) Performed: Procedure(s): TOTAL KNEE ARTHROPLASTY (Right)  Patient Location: PACU  Anesthesia Type:General and Regional  Level of Consciousness: awake, alert , oriented, patient cooperative and responds to stimulation  Airway & Oxygen Therapy: Patient Spontanous Breathing and Patient connected to nasal cannula oxygen  Post-op Assessment: Report given to PACU RN and Post -op Vital signs reviewed and stable  Post vital signs: Reviewed and stable  Complications: No apparent anesthesia complications

## 2013-08-09 NOTE — Progress Notes (Signed)
Orthopedic Tech Progress Note Patient Details:  Samantha Ali 04-14-1946 [id number] CPM applied to Right LE with appropriate settings. OHF applied to bed.  CPM Right Knee CPM Right Knee: On Right Knee Flexion (Degrees): 60 Right Knee Extension (Degrees): 0   Asia R Thompson 08/09/2013, 10:53 AM

## 2013-08-09 NOTE — Anesthesia Procedure Notes (Signed)
Anesthesia Regional Block:  Femoral nerve block  Pre-Anesthetic Checklist: ,, timeout performed, Correct Patient, Correct Site, Correct Laterality, Correct Procedure, Correct Position, site marked, Risks and benefits discussed, at surgeon's request and post-op pain management  Laterality: Right and Upper  Prep: chloraprep       Needles:  Injection technique: Single-shot  Needle Type: Stimulator Needle - 80     Needle Length:cm 9 cm Needle Gauge: 22 and 22 G  Needle insertion depth: 6 cm   Additional Needles:  Procedures: ultrasound guided (picture in chart) and nerve stimulator Femoral nerve block  Nerve Stimulator or Paresthesia:  Response: Twitch elicited, 0.8 mA,   Additional Responses:   Narrative:  Start time: 08/09/2013 7:00 AM End time: 08/09/2013 7:15 AM Injection made incrementally with aspirations every 5 mL.  Performed by: Personally  Anesthesiologist: Bartolo Darter, MD  Additional Notes: BP cuff, EKG monitors applied. Sedation begun. Femoral artery palpated for location of nerve. After nerve location anesthetic injected incrementally, slowly , and after neg aspirations. Tolerated well.

## 2013-08-09 NOTE — Anesthesia Preprocedure Evaluation (Signed)
Anesthesia Evaluation  Patient identified by MRN, date of birth, ID band Patient awake    Reviewed: Allergy & Precautions, NPO status , Patient's Chart, lab work & pertinent test results  History of Anesthesia Complications Negative for: history of anesthetic complications  Airway Mallampati: I  Neck ROM: Full    Dental   Pulmonary neg pulmonary ROS,  breath sounds clear to auscultation        Cardiovascular negative cardio ROS  Rhythm:Regular Rate:Normal     Neuro/Psych  Headaches,    GI/Hepatic GERD-  ,  Endo/Other    Renal/GU      Musculoskeletal   Abdominal   Peds  Hematology   Anesthesia Other Findings   Reproductive/Obstetrics                           Anesthesia Physical Anesthesia Plan  ASA: II  Anesthesia Plan: Regional   Post-op Pain Management:    Induction: Intravenous  Airway Management Planned: Simple Face Mask  Additional Equipment:   Intra-op Plan:   Post-operative Plan: Extubation in OR  Informed Consent: I have reviewed the patients History and Physical, chart, labs and discussed the procedure including the risks, benefits and alternatives for the proposed anesthesia with the patient or authorized representative who has indicated his/her understanding and acceptance.   Dental advisory given  Plan Discussed with: CRNA and Surgeon  Anesthesia Plan Comments:         Anesthesia Quick Evaluation

## 2013-08-09 NOTE — Preoperative (Signed)
Beta Blockers   Reason not to administer Beta Blockers:Not Applicable 

## 2013-08-09 NOTE — Evaluation (Signed)
Physical Therapy Evaluation Patient Details Name: Samantha Ali MRN: [id number] DOB: [birth year] Today's Date: 08/09/2013 Time: 6213-0865 PT Time Calculation (min): 29 min  PT Assessment / Plan / Recommendation History of Present Illness  s/p RTKA  Clinical Impression  Pt is s/p TKA resulting in the deficits listed below (see PT Problem List).  Pt will benefit from skilled PT to increase their independence and safety with mobility to allow discharge to the venue listed below.      PT Assessment  Patient needs continued PT services    Follow Up Recommendations  Home health PT;Supervision/Assistance - 24 hour    Does the patient have the potential to tolerate intense rehabilitation      Barriers to Discharge        Equipment Recommendations  Rolling walker with 5" wheels;3in1 (PT)    Recommendations for Other Services     Frequency 7X/week    Precautions / Restrictions Precautions Precautions: Knee Restrictions RLE Weight Bearing: Weight bearing as tolerated   Pertinent Vitals/Pain 3/10 R knee;  elevated for edema and pain control patient repositioned for comfort and optimal knee extension      Mobility  Bed Mobility Overal bed mobility: Needs Assistance Bed Mobility: Supine to Sit Supine to sit: Min guard General bed mobility comments: Cues for technique Transfers Overall transfer level: Needs assistance Equipment used: Rolling walker (2 wheeled) Transfers: Sit to/from Stand Sit to Stand: Min assist General transfer comment: Cues for safety, technique, hand placement Ambulation/Gait Ambulation/Gait assistance: Min assist Ambulation Distance (Feet): 18 Feet Assistive device: Rolling walker (2 wheeled) Gait Pattern/deviations: Step-to pattern General Gait Details: Cues for technique and gait sequence, also to activate R quad for stance stability (even in KI)    Exercises Total Joint Exercises Ankle Circles/Pumps: AROM;Both;10 reps Quad Sets: AROM;Right;5  reps Heel Slides: AROM;Right;5 reps Straight Leg Raises: AAROM;Right;5 reps   PT Diagnosis: Difficulty walking;Acute pain  PT Problem List: Decreased strength;Decreased range of motion;Decreased activity tolerance;Decreased balance;Decreased mobility;Decreased knowledge of use of DME;Pain;Decreased knowledge of precautions PT Treatment Interventions: DME instruction;Gait training;Stair training;Functional mobility training;Therapeutic activities;Therapeutic exercise;Patient/family education     PT Goals(Current goals can be found in the care plan section) Acute Rehab PT Goals Patient Stated Goal: walk without pain PT Goal Formulation: With patient Time For Goal Achievement: 08/16/13 Potential to Achieve Goals: Good  Visit Information  Last PT Received On: 08/09/13 Assistance Needed: +1 History of Present Illness: s/p RTKA       Prior Functioning  Home Living Family/patient expects to be discharged to:: Private residence Living Arrangements: Spouse/significant other Available Help at Discharge: Family;Available 24 hours/day Type of Home: House Home Access: Stairs to enter [employer] of Steps: 2 Entrance Stairs-Rails: None Home Layout: One level Home Equipment: Environmental consultant - standard Prior Function Level of Independence: Independent Communication Communication: No difficulties    Cognition  Cognition Arousal/Alertness: Awake/alert Behavior During Therapy: WFL for tasks assessed/performed Overall Cognitive Status: Within Functional Limits for tasks assessed    Extremity/Trunk Assessment Upper Extremity Assessment Upper Extremity Assessment: Overall WFL for tasks assessed Lower Extremity Assessment Lower Extremity Assessment: RLE deficits/detail RLE Deficits / Details: Grossly decr AROM and strength, limited by pain postop; minimal quad set   Balance    End of Session PT - End of Session Equipment Utilized During Treatment: Gait belt;Right knee  immobilizer Activity Tolerance: Patient tolerated treatment well Patient left: in chair;with call bell/phone within reach;with family/visitor present Nurse Communication: Mobility status CPM Right Knee CPM Right Knee: On  GP  Roney Marion [name], [name]  08/09/2013, 4:49 PM

## 2013-08-09 NOTE — Anesthesia Postprocedure Evaluation (Signed)
  Anesthesia Post-op Note  Patient: Samantha Ali  Procedure(s) Performed: Procedure(s): TOTAL KNEE ARTHROPLASTY (Right)  Patient Location: PACU  Anesthesia Type:GA combined with regional for post-op pain  Level of Consciousness: awake  Airway and Oxygen Therapy: Patient Spontanous Breathing  Post-op Pain: mild  Post-op Assessment: Post-op Vital signs reviewed  Post-op Vital Signs: stable  Complications: No apparent anesthesia complications

## 2013-08-09 NOTE — Progress Notes (Signed)
Orthopedic Tech Progress Note Patient Details:  Samantha Ali 16-Jul-1946 [id number] On cpm at 8:15 pm RLE 0-60 Patient ID: [name], female   DOB: 03-18-1946, 68 y.o.   MRN: [id number]   [name] 08/09/2013, 8:20 PM

## 2013-08-10 ENCOUNTER — Encounter (HOSPITAL_COMMUNITY): Payer: Self-pay | Admitting: Orthopaedic Surgery

## 2013-08-10 LAB — BASIC METABOLIC PANEL
BUN: 11 mg/dL (ref 6–23)
CHLORIDE: 102 meq/L (ref 96–112)
CO2: 26 meq/L (ref 19–32)
CREATININE: 0.88 mg/dL (ref 0.50–1.10)
Calcium: 8.2 mg/dL — ABNORMAL LOW (ref 8.4–10.5)
GFR calc Af Amer: 76 mL/min — ABNORMAL LOW (ref >90)
GFR calc non Af Amer: 66 mL/min — ABNORMAL LOW (ref >90)
GLUCOSE: 121 mg/dL — AB (ref 70–99)
Potassium: 4 mEq/L (ref 3.7–5.3)
SODIUM: 137 meq/L (ref 137–147)

## 2013-08-10 LAB — CBC
HEMATOCRIT: 27.6 % — AB (ref 36.0–46.0)
Hemoglobin: 9.2 g/dL — ABNORMAL LOW (ref 12.0–15.0)
MCH: 30.1 pg (ref 26.0–34.0)
MCHC: 33.3 g/dL (ref 30.0–36.0)
MCV: 90.2 fL (ref 78.0–100.0)
Platelets: 211 10*3/uL (ref 150–400)
RBC: 3.06 MIL/uL — ABNORMAL LOW (ref 3.87–5.11)
RDW: 14.5 % (ref 11.5–15.5)
WBC: 10.4 10*3/uL (ref 4.0–10.5)

## 2013-08-10 NOTE — Progress Notes (Signed)
Subjective: 1 Day Post-Op Procedure(s) (LRB): TOTAL KNEE ARTHROPLASTY (Right) Complaining of very little pain. Plan is to discharge home Thursday or Friday when therapy okay  Activity level:  Weightbearing as tolerated right leg Diet tolerance:  ok Voiding:  ok Patient reports pain as 2 on 0-10 scale.    Objective: Vital signs in last 24 hours: Temp:  [97 F (36.1 C)-98.6 F (37 C)] 98.3 F (36.8 C) (01/14 0519) Pulse Rate:  [68-89] 86 (01/14 0519) Resp:  [13-19] 16 (01/14 0519) BP: (98-132)/(48-67) 106/48 mmHg (01/14 0519) SpO2:  [93 %-96 %] 95 % (01/14 0519)  Labs:  Recent Labs  08/10/13 0551  HGB 9.2*    Recent Labs  08/10/13 0551  WBC 10.4  RBC 3.06*  HCT 27.6*  PLT 211    Recent Labs  08/10/13 0551  NA 137  K 4.0  CL 102  CO2 26  BUN 11  CREATININE 0.88  GLUCOSE 121*  CALCIUM 8.2*   No results found for this basename: LABPT, INR,  in the last 72 hours  Physical Exam:  Neurologically intact ABD soft Neurovascular intact Sensation intact distally Intact pulses distally Dorsiflexion/Plantar flexion intact Incision: dressing C/D/I No cellulitis present Compartment soft  Assessment/Plan:  1 Day Post-Op Procedure(s) (LRB): TOTAL KNEE ARTHROPLASTY (Right) Advance diet Up with therapy D/C IV fluids Plan for discharge tomorrow if passes physical therapy Change dressing later today. Weightbearing as tolerated. ASA 3251 pill twice a day for 2 weeks. When discharged home we'll have home health and therapy.    Jujhar Everett R 08/10/2013, 8:42 AM

## 2013-08-10 NOTE — Progress Notes (Signed)
Physical Therapy Treatment Note  Continuing progress with mobility including steps with husband's help; They will benefit from reviewing stair technique next session, and pt declined therex this session, so will need to go over next session  On track for dc home tomorrow from PT standpoint  Soreness in knee, did not rate; patient repositioned for comfort and optimal knee ext   08/10/13 1600  PT Visit Information  Last PT Received On 08/10/13  Assistance Needed +1  History of Present Illness s/p RTKA  PT Time Calculation  PT Start Time 1507  PT Stop Time 1542  PT Time Calculation (min) 35 min  Subjective Data  Subjective Wanting to get to bathroom  first, then practice steps  Patient Stated Goal Home when able  Precautions  Precautions Knee  Required Braces or Orthoses Knee Immobilizer - Right  Restrictions  RLE Weight Bearing WBAT  Cognition  Arousal/Alertness Awake/alert  Behavior During Therapy WFL for tasks assessed/performed  Overall Cognitive Status Within Functional Limits for tasks assessed  Bed Mobility  Overal bed mobility Needs Assistance  Bed Mobility Supine to Sit  Supine to sit Supervision  General bed mobility comments Cues for technique  Transfers  Overall transfer level Needs assistance  Equipment used Rolling walker (2 wheeled)  Transfers Sit to/from Stand  Sit to Stand Supervision  General transfer comment Cues for safety, technique, hand placement  Ambulation/Gait  Ambulation/Gait assistance Min guard;Supervision  Ambulation Distance (Feet) 250 Feet  Assistive device Rolling walker (2 wheeled)  Gait Pattern/deviations Step-to pattern;Step-through pattern  Gait velocity slowed  General Gait Details Cues for technique and gait sequence, also to activate R quad for stance stability (even in KI); pt showed good self-monitor for activity tolerance, and was able to increase distance ambulated  Stairs Yes  Stairs assistance Min assist  Stair Management No  rails;Step to pattern;Backwards;With walker  Number of Stairs 2  General stair comments Verbal and demo cues for technique; Husband present and provided correct assistance  Exercises  Exercises Total Joint (Pt ploitely declined therex)  PT - End of Session  Equipment Utilized During Treatment Gait belt;Right knee immobilizer  Activity Tolerance Patient tolerated treatment well  Patient left in chair;with call bell/phone within reach;with family/visitor present  Nurse Communication Mobility status  PT - Assessment/Plan  PT Plan Current plan remains appropriate  PT Frequency 7X/week  Follow Up Recommendations Home health PT;Supervision/Assistance - 24 hour  PT equipment Rolling walker with 5" wheels;3in1 (PT)  PT Goal Progression  Progress towards PT goals Progressing toward goals  Acute Rehab PT Goals  PT Goal Formulation With patient  Time For Goal Achievement 08/16/13  Potential to Achieve Goals Good  PT General Charges  $$ ACUTE PT VISIT 1 Procedure  PT Treatments  $Gait Training 23-37 mins  [place], [place]

## 2013-08-10 NOTE — Care Management Note (Signed)
CARE MANAGEMENT NOTE 08/10/2013  Patient:  Samantha Ali, Samantha Ali   Account Number:  [id number]  Date Initiated:  08/09/2013  Documentation initiated by:  Ricki Miller  Subjective/Objective Assessment:   68 yr old female s/p right total knee arthroplasty.     Action/Plan:   Patient preoperatively setup with [facility].No changes. CM spoke with patient concerning [place] and DME needs.   Anticipated DC Date:  08/11/2013   Anticipated DC Plan:  [place]  CM consult      PAC Choice  [place]   Choice offered to / List presented to:  C-1 Patient   DME arranged  3-N-1  [place]  CPM      DME agency  [employer]     [place] arranged  HH-2 PT      [place] agency  [facility]   Status of service:  Completed, signed off Medicare Important Message given?   (If response is "NO", the following Medicare IM given date fields will be blank) Date Medicare IM given:   Date Additional Medicare IM given:    Discharge Disposition:  [place]  Per UR Regulation:    If discussed at Long Length of Stay Meetings, dates discussed:    Comments:

## 2013-08-10 NOTE — Evaluation (Signed)
Occupational Therapy Evaluation Patient Details Name: Samantha Ali MRN: [id number] DOB: 1946/04/13 Today's Date: 08/10/2013 Time: 3818-2993 OT Time Calculation (min): 24 min  OT Assessment / Plan / Recommendation History of present illness s/p RTKA   Clinical Impression   Pt presents s/p R TKA and w/ deficits in her ability to independently perform ADL & functional transfers. Pt will benefit from acute OT to address deficits (see problem list below) & maximize independence prior to anticipated d/c home w/ husband/family PRN assist. Tub transfer w/ bench & LB dressing next visit.    OT Assessment  Patient needs continued OT Services    Follow Up Recommendations  No OT follow up;Supervision - Intermittent    Barriers to Discharge      Equipment Recommendations  3 in 1 bedside comode;Tub/shower bench;Other (comment) (Pt reports that above DME was ordered for her prior to this surgery.)    Recommendations for Other Services    Frequency  Min 2X/week    Precautions / Restrictions Precautions Precautions: Knee Required Braces or Orthoses: Knee Immobilizer - Right Restrictions Weight Bearing Restrictions: Yes RLE Weight Bearing: Weight bearing as tolerated   Pertinent Vitals/Pain No c/o pain. Pt was given pain medication prior to session.    ADL  Eating/Feeding: Performed;Independent Where Assessed - Eating/Feeding: Chair Grooming: Performed;Wash/dry hands;Wash/dry face;Teeth care;Supervision/safety (Standing at sink) Where Assessed - Grooming: Unsupported standing;Supported standing Upper Body Bathing: Simulated;Set up Where Assessed - Upper Body Bathing: Unsupported sitting Lower Body Bathing: Simulated;Minimal assistance Where Assessed - Lower Body Bathing: Supported sit to stand Upper Body Dressing: Simulated;Set up Where Assessed - Upper Body Dressing: Unsupported sitting Lower Body Dressing: Simulated;Minimal assistance Where Assessed - Lower Body Dressing: Supported  sit to stand;Unsupported sitting Toilet Transfer: Performed;Supervision/safety;Min guard Armed forces technical officer Method: Sit to Loss adjuster, chartered: Therapist, occupational and Hygiene: Simulated;Supervision/safety Where Assessed - Best boy and Hygiene: Sit to stand from 3-in-1 or toilet Tub/Shower Transfer Method: Not assessed Equipment Used: Knee Immobilizer;Rolling walker Transfers/Ambulation Related to ADLs: Pt overall supervision level functional mobility and transfers (chair/toilet/3:1) w/ RW & R LE KI. ADL Comments: Pt was educated in role of OT followed by participation for ADL retraining session. Pt stood at sink for grooming tasks, supervision functional mobiilty in room/bathroom and to chair. Pt was also educted verbally in tub transfers w/ bench. Pt reports that RW, toilet riser and tub bench have been pre-ordered prior to this surgery. Discussed use of DME at home. Pt plans for husband to assist PRN at d/c.    OT Diagnosis: Generalized weakness;Acute pain  OT Problem List: Decreased knowledge of precautions;Decreased knowledge of use of DME or AE;Decreased activity tolerance;Pain OT Treatment Interventions: Self-care/ADL training;DME and/or AE instruction;Patient/family education;Therapeutic activities   OT Goals(Current goals can be found in the care plan section) Acute Rehab OT Goals Patient Stated Goal: Home when able Time For Goal Achievement: 08/17/13 Potential to Achieve Goals: Good  Visit Information  Last OT Received On: 08/10/13 Assistance Needed: +1 History of Present Illness: s/p RTKA       Prior Functioning     Home Living Family/patient expects to be discharged to:: Private residence Living Arrangements: Spouse/significant other Available Help at Discharge: Family;Available 24 hours/day Type of Home: House Home Access: Stairs to enter [employer] of Steps: 2 Entrance Stairs-Rails:  None Home Layout: One level Home Equipment: Walker - standard;Bedside commode;Tub bench;Walker - 2 wheels Prior Function Level of Independence: Independent Communication Communication: No difficulties Dominant Hand: Right  Vision/Perception Vision - History Baseline Vision: Wears glasses all the time Patient Visual Report: No change from baseline   Cognition  Cognition Arousal/Alertness: Awake/alert Behavior During Therapy: WFL for tasks assessed/performed Overall Cognitive Status: Within Functional Limits for tasks assessed    Extremity/Trunk Assessment Upper Extremity Assessment Upper Extremity Assessment: Overall WFL for tasks assessed Lower Extremity Assessment Lower Extremity Assessment: Defer to PT evaluation;RLE deficits/detail RLE Deficits / Details: Grossly decr AROM and strength, limited by pain postop; minimal quad set    Mobility Transfers Overall transfer level: Needs assistance Equipment used: Rolling walker (2 wheeled) Transfers: Sit to/from Stand Sit to Stand: Supervision General transfer comment: Cues for safety, technique, hand placement        Balance Balance Overall balance assessment: No apparent balance deficits (not formally assessed)   End of Session OT - End of Session Equipment Utilized During Treatment: Rolling walker;Right knee immobilizer Activity Tolerance: Patient tolerated treatment well Patient left: in chair;with call bell/phone within reach;with family/visitor present CPM Right Knee CPM Right Knee: Off  GO     Almyra Deforest 08/10/2013, 9:33 AM

## 2013-08-10 NOTE — Progress Notes (Signed)
Physical Therapy Treatment Patient Details Name: ARTASIA THANG MRN: [id number] DOB: 02-10-1946 Today's Date: 08/10/2013 Time: 0832-0905 PT Time Calculation (min): 33 min  PT Assessment / Plan / Recommendation  History of Present Illness s/p RTKA   PT Comments   Making progress with mobilty and activity tolerance, despite more pain today; Plan to focus next session on therex and Patient needs to practice stairs next session.     Follow Up Recommendations  Home health PT;Supervision/Assistance - 24 hour     Does the patient have the potential to tolerate intense rehabilitation     Barriers to Discharge        Equipment Recommendations  Rolling walker with 5" wheels;3in1 (PT)    Recommendations for Other Services    Frequency 7X/week   Progress towards PT Goals Progress towards PT goals: Progressing toward goals  Plan Current plan remains appropriate    Precautions / Restrictions Precautions Precautions: Knee Required Braces or Orthoses: Knee Immobilizer - Right Restrictions Weight Bearing Restrictions: Yes RLE Weight Bearing: Weight bearing as tolerated   Pertinent Vitals/Pain 7/10 R knee with amb; patient repositioned for comfort     Mobility  Bed Mobility Overal bed mobility: Needs Assistance Bed Mobility: Supine to Sit Supine to sit: Min guard General bed mobility comments: Cues for technique Transfers Overall transfer level: Needs assistance Equipment used: Rolling walker (2 wheeled) Transfers: Sit to/from Stand Sit to Stand: Supervision General transfer comment: Cues for safety, technique, hand placement Ambulation/Gait Ambulation/Gait assistance: Min guard Ambulation Distance (Feet): 70 Feet Assistive device: Rolling walker (2 wheeled) Gait Pattern/deviations: Step-to pattern General Gait Details: Cues for technique and gait sequence, also to activate R quad for stance stability (even in KI); pt showed good self-monitor for activity tolerance    Exercises  Total Joint Exercises Quad Sets: AROM;Right;5 reps Straight Leg Raises: AAROM;Right;5 reps   PT Diagnosis:    PT Problem List:   PT Treatment Interventions:     PT Goals (current goals can now be found in the care plan section) Acute Rehab PT Goals Patient Stated Goal: Home when able PT Goal Formulation: With patient Time For Goal Achievement: 08/16/13 Potential to Achieve Goals: Good  Visit Information  Last PT Received On: 08/10/13 Assistance Needed: +1 History of Present Illness: s/p RTKA    Subjective Data  Subjective: Wanting to get to [facility] first Patient Stated Goal: Home when able   Cognition  Cognition Arousal/Alertness: Awake/alert Behavior During Therapy: WFL for tasks assessed/performed Overall Cognitive Status: Within Functional Limits for tasks assessed    Balance  Balance Overall balance assessment: No apparent balance deficits (not formally assessed)  End of Session PT - End of Session Equipment Utilized During Treatment: Gait belt;Right knee immobilizer Activity Tolerance: Patient tolerated treatment well Patient left: in chair;with call bell/phone within reach;with family/visitor present Nurse Communication: Mobility status CPM Right Knee CPM Right Knee: Off   GP     Roney Marion [place], [place]  08/10/2013, 12:18 PM

## 2013-08-10 NOTE — Progress Notes (Signed)
Orthopedic Tech Progress Note Patient Details:  SHADIYAH WERNLI Jun 27, 1946 [id number] On cpm at 4:00 pm RLE 0-70 Patient ID: [name], female   DOB: [birth year], 68 y.o.   MRN: [id number]   [name] 08/10/2013, 3:57 PM

## 2013-08-11 LAB — CBC
HCT: 28.4 % — ABNORMAL LOW (ref 36.0–46.0)
Hemoglobin: 9 g/dL — ABNORMAL LOW (ref 12.0–15.0)
MCH: 29.2 pg (ref 26.0–34.0)
MCHC: 31.7 g/dL (ref 30.0–36.0)
MCV: 92.2 fL (ref 78.0–100.0)
Platelets: 199 10*3/uL (ref 150–400)
RBC: 3.08 MIL/uL — AB (ref 3.87–5.11)
RDW: 14.5 % (ref 11.5–15.5)
WBC: 10.8 10*3/uL — ABNORMAL HIGH (ref 4.0–10.5)

## 2013-08-11 MED ORDER — ASPIRIN 325 MG PO TBEC: 325.0000 mg | DELAYED_RELEASE_TABLET | Freq: Two times a day (BID) | ORAL | Status: DC

## 2013-08-11 MED ORDER — HYDROCODONE-ACETAMINOPHEN 5-325 MG PO TABS: 1.0000 | ORAL_TABLET | Freq: Four times a day (QID) | ORAL | Status: DC | PRN

## 2013-08-11 NOTE — Progress Notes (Signed)
Physical Therapy Treatment Patient Details Name: Samantha Ali MRN: [id number] DOB: 07-May-1946 Today's Date: 08/11/2013 Time: 0816-0903 PT Time Calculation (min): 47 min  PT Assessment / Plan / Recommendation  History of Present Illness s/p RTKA   PT Comments   Pt overall moving well but seems to be apprehensive about return home possibly today.  Reviewed steps with pt & husband being able to complete with only min cueing needed.  Pt safe from mobility standpoint to d/c home when MD feels appropriate.     Follow Up Recommendations  Home health PT;Supervision/Assistance - 24 hour     Does the patient have the potential to tolerate intense rehabilitation     Barriers to Discharge        Equipment Recommendations  Rolling walker with 5" wheels;3in1 (PT)    Recommendations for Other Services    Frequency 7X/week   Progress towards PT Goals Progress towards PT goals: Progressing toward goals  Plan Current plan remains appropriate    Precautions / Restrictions Precautions Precautions: Knee Required Braces or Orthoses: Knee Immobilizer - Right Restrictions Weight Bearing Restrictions: Yes RLE Weight Bearing: Weight bearing as tolerated   Pertinent Vitals/Pain 3/10 be therapy started.  6-7/10 at end of session.  RN notified for pain medication.  Repositioned for comfort.      Mobility  Bed Mobility Overal bed mobility: Needs Assistance Bed Mobility: Supine to Sit Supine to sit: Min assist General bed mobility comments: Pt able to move LE to EOB by herself but then requested (A) to support RLE as she scooted hips to EOB.   Transfers Overall transfer level: Needs assistance Equipment used: Rolling walker (2 wheeled) Transfers: Sit to/from Stand Sit to Stand: Supervision General transfer comment: Pt demonstrates safe hand placement.   Ambulation/Gait Ambulation/Gait assistance: Supervision Ambulation Distance (Feet): 40 Feet Assistive device: Rolling walker (2 wheeled) Gait  Pattern/deviations: Step-to pattern Gait velocity: decreased General Gait Details: Ambulated without KI.  No knee buckling noted.   Encouragement to increase RLE WBing & to increase LLE step length.  Slow & cautious.   Stairs: Yes Stair Management: No rails;Backwards;With walker Number of Stairs: 2 General stair comments: Min cues for technique.  Pt's husband present & provided assistance.      Exercises Total Joint Exercises Ankle Circles/Pumps: AROM;Both;10 reps Quad Sets: AROM;Strengthening;Both;10 reps Hip ABduction/ADduction: AAROM;Strengthening;Right;10 reps Straight Leg Raises: AAROM;Strengthening;Right;Both Knee Flexion: AAROM;Right;10 reps (self AAROM in sitting) Goniometric ROM: AAROM Rt knee flexion ~50 degrees in sitting.       PT Goals (current goals can now be found in the care plan section) Acute Rehab PT Goals Patient Stated Goal: Home when able PT Goal Formulation: With patient Time For Goal Achievement: 08/16/13 Potential to Achieve Goals: Good  Visit Information  Last PT Received On: 08/11/13 Assistance Needed: +1 History of Present Illness: s/p RTKA    Subjective Data  Patient Stated Goal: Home when able   Cognition  Cognition Arousal/Alertness: Awake/alert Behavior During Therapy: WFL for tasks assessed/performed Overall Cognitive Status: Within Functional Limits for tasks assessed    Balance     End of Session PT - End of Session Activity Tolerance: Patient tolerated treatment well Patient left: in chair;with call bell/phone within reach Nurse Communication: Mobility status   GP     Sena Hitch 08/11/2013, 9:09 AM  Sarajane Marek, PTA [phone] 08/11/2013

## 2013-08-11 NOTE — Progress Notes (Signed)
Occupational Therapy Treatment Patient Details Name: TANAI BOULER MRN: [id number] DOB: 1946/06/01 Today's Date: 08/11/2013 Time: 0459-9774 OT Time Calculation (min): 39 min  OT Assessment / Plan / Recommendation  History of present illness s/p RTKA   OT comments  All education is complete and patient indicates understanding. Adequate level for d/ c home from OT standpoint. Ot to sign off acute  Follow Up Recommendations  No OT follow up;Supervision - Intermittent    Barriers to Discharge       Equipment Recommendations  3 in 1 bedside comode;Tub/shower bench;Other (comment)    Recommendations for Other Services    Frequency Min 2X/week   Progress towards OT Goals Progress towards OT goals: Goals met and updated - see care plan  Plan Discharge plan remains appropriate    Precautions / Restrictions Precautions Precautions: Knee Required Braces or Orthoses:  (currently d/c) Restrictions RLE Weight Bearing: Weight bearing as tolerated   Pertinent Vitals/Pain Premedicated around 1300pm - currently with no pain complaints     ADL  Eating/Feeding: Independent Where Assessed - Eating/Feeding: Bed level Lower Body Dressing: Modified independent Where Assessed - Lower Body Dressing: Unsupported sit to stand (with AE) Tub/Shower Transfer: Supervision/safety Tub/Shower Transfer Method: Therapist, art: IT consultant Used: Rolling walker Transfers/Ambulation Related to ADLs: pt completing supine<>Sit EOB, eob<>chair, chair<>tub bench, tub bench<> chair, Chair ambulation > 150 feet to room , Standing <>sitting eob ADL Comments: Pt is at adequate level for d/c home at this time. ALl education complete. Pt demonstrated use of reacher and sock aid for Rt LE dressing. Pt demonstrates tub transfer and using teach back confirmed how to educate husband to setup environment    OT Diagnosis:    OT Problem List:   OT Treatment Interventions:     OT  Goals(current goals can now be found in the care plan section) Acute Rehab OT Goals Patient Stated Goal: Home when able Time For Goal Achievement: 08/17/13 Potential to Achieve Goals: Good ADL Goals Pt Will Perform Grooming: with modified independence;standing Pt Will Perform Lower Body Dressing: with supervision;with min guard assist;sit to/from stand;with caregiver independent in assisting Pt Will Transfer to Toilet: with modified independence;ambulating;bedside commode Pt Will Perform Toileting - Clothing Manipulation and hygiene: with modified independence;sit to/from stand Pt Will Perform Tub/Shower Transfer: Tub transfer;with min guard assist;ambulating;tub bench;rolling walker  Visit Information  Last OT Received On: 08/11/13 Assistance Needed: +1 History of Present Illness: s/p RTKA    Subjective Data      Prior Functioning       Cognition  Cognition Arousal/Alertness: Awake/alert Behavior During Therapy: WFL for tasks assessed/performed Overall Cognitive Status: Within Functional Limits for tasks assessed    Mobility  Transfers Overall transfer level: Modified independent Equipment used: Rolling walker (2 wheeled) Transfers: Sit to/from Stand Sit to Stand: Modified independent (Device/Increase time)    Exercises  Total Joint Exercises Quad Sets: AROM;Strengthening;Both;10 reps Long Arc Quad: AROM;Strengthening;Left;10 reps   Balance    End of Session OT - End of Session Activity Tolerance: Patient tolerated treatment well Patient left: in bed;with call bell/phone within reach Nurse Communication: Mobility status;Precautions  GO     Peri Maris 08/11/2013, 3:41 PM Pager: [phone]

## 2013-08-11 NOTE — Progress Notes (Signed)
Physical Therapy Treatment Patient Details Name: ISSABELLE MCRANEY MRN: [id number] DOB: 06-Jan-1946 Today's Date: 08/11/2013 Time: 5573-2202 PT Time Calculation (min): 28 min  PT Assessment / Plan / Recommendation  History of Present Illness s/p RTKA   PT Comments   Pt cont's to make progress with mobility.  Moves slow but well.    Follow Up Recommendations  Home health PT;Supervision/Assistance - 24 hour     Does the patient have the potential to tolerate intense rehabilitation     Barriers to Discharge        Equipment Recommendations  Rolling walker with 5" wheels;3in1 (PT)    Recommendations for Other Services    Frequency 7X/week   Progress towards PT Goals Progress towards PT goals: Progressing toward goals  Plan Current plan remains appropriate    Precautions / Restrictions Precautions Precautions: Knee Required Braces or Orthoses: Knee Immobilizer - Right Restrictions RLE Weight Bearing: Weight bearing as tolerated   Pertinent Vitals/Pain 3-4/10 Rt knee.  RN administered pain medication at beginning of session.     Mobility  Transfers Overall transfer level: Modified independent Equipment used: Rolling walker (2 wheeled) Transfers: Sit to/from Stand Sit to Stand: Modified independent (Device/Increase time) Ambulation/Gait Ambulation/Gait assistance: Supervision Ambulation Distance (Feet): 200 Feet Assistive device: Rolling walker (2 wheeled) Gait Pattern/deviations: Step-to pattern;Decreased step length - left Gait velocity: decreased General Gait Details: Cues for increased RLE WBing & to increase Lt step length.      Exercises Total Joint Exercises Quad Sets: AROM;Strengthening;Both;10 reps Long Arc Quad: AROM;Strengthening;Left;10 reps     PT Goals (current goals can now be found in the care plan section) Acute Rehab PT Goals Patient Stated Goal: Home when able PT Goal Formulation: With patient Time For Goal Achievement: 08/16/13 Potential to Achieve  Goals: Good  Visit Information  Last PT Received On: 08/11/13 Assistance Needed: +1 History of Present Illness: s/p RTKA    Subjective Data  Patient Stated Goal: Home when able   Cognition  Cognition Arousal/Alertness: Awake/alert Behavior During Therapy: WFL for tasks assessed/performed Overall Cognitive Status: Within Functional Limits for tasks assessed    Balance     End of Session PT - End of Session Activity Tolerance: Patient tolerated treatment well Patient left: in chair;with call bell/phone within reach;with family/visitor present Nurse Communication: Mobility status   GP     Sena Hitch 08/11/2013, 1:33 PM   Sarajane Marek, PTA [phone] 08/11/2013

## 2013-08-11 NOTE — Progress Notes (Signed)
OT Cancellation Note  Patient Details Name: Samantha Ali MRN: [id number] DOB: December 30, 1945   Cancelled Treatment:    Reason Eval/Treat Not Completed: Pain limiting ability to participate. Pt finishing PT session and recent pain medications. Pt reports pain 8 out 10 currently.  Peri Maris Pager: [phone]  08/11/2013, 10:16 AM

## 2013-08-11 NOTE — Discharge Instructions (Signed)
Home Health Physical Therapy to be provided by [facility] [phone] May continue ice and elevation. Change dressing as needed. Return to office in 2 weeks. Continue ASA 3251 pill twice a day for 2 weeks.

## 2013-08-11 NOTE — Progress Notes (Signed)
Subjective: 2 Days Post-Op Procedure(s) (LRB): TOTAL KNEE ARTHROPLASTY (Right) Slow progress in therapy. Activity level:  Weightbearing as tolerated Diet tolerance:  ok Voiding:  ok Patient reports pain as 6 on 0-10 scale.    Objective: Vital signs in last 24 hours: Temp:  [98.2 F (36.8 C)-98.6 F (37 C)] 98.6 F (37 C) (01/15 1200) Pulse Rate:  [80-96] 80 (01/15 1200) Resp:  [18] 18 (01/15 1200) BP: (113-135)/(47-65) 113/47 mmHg (01/15 1200) SpO2:  [96 %-97 %] 96 % (01/15 1200) Weight:  [77.6 kg (171 lb 1.2 oz)] 77.6 kg (171 lb 1.2 oz) (01/15 1317)  Labs:  Recent Labs  08/10/13 0551 08/11/13 0258  HGB 9.2* 9.0*    Recent Labs  08/10/13 0551 08/11/13 0258  WBC 10.4 10.8*  RBC 3.06* 3.08*  HCT 27.6* 28.4*  PLT 211 199    Recent Labs  08/10/13 0551  NA 137  K 4.0  CL 102  CO2 26  BUN 11  CREATININE 0.88  GLUCOSE 121*  CALCIUM 8.2*   No results found for this basename: LABPT, INR,  in the last 72 hours  Physical Exam:  Neurologically intact ABD soft Neurovascular intact Sensation intact distally Intact pulses distally Dorsiflexion/Plantar flexion intact No cellulitis present Compartment soft  Assessment/Plan:  2 Days Post-Op Procedure(s) (LRB): TOTAL KNEE ARTHROPLASTY (Right) Advance diet Up with therapy D/C IV fluids Plan for discharge tomorrow with home therapy. Continue ASA 325 twice a day x2 weeks. Prescriptions on chart. Continue incentive spirometry/SCDs    Asier Desroches R 08/11/2013, 2:32 PM

## 2013-08-11 NOTE — Discharge Summary (Signed)
Patient ID: Samantha Ali MRN: [id number] DOB/AGE: 69/10/1945 69 y.o.  Admit date: 08/09/2013 Discharge date: 08/11/2013  Admission Diagnoses:  Active Problems:   Total knee replacement status, right   Discharge Diagnoses:  Same  Past Medical History  Diagnosis Date  . Cancer   . Breast cancer   . Clot 1996    in the right leg- in the muscle    . GERD (gastroesophageal reflux disease)   . DVT (deep venous thrombosis) 1990's    probably related to positive phlebitis  . Headache(784.0)     migraines- most often 2-3 times /week   . Arthritis     R knee  . Photophobia of both eyes     unknown etiology    Surgeries: Procedure(s): TOTAL KNEE ARTHROPLASTY on 08/09/2013   Consultants:    Discharged Condition: Improved  Hospital Course: LODA BIALAS is an 68 y.o. female who was admitted 08/09/2013 for operative treatment of<principal problem not specified>. Patient has severe unremitting pain that affects sleep, daily activities, and work/hobbies. After pre-op clearance the patient was taken to the operating room on 08/09/2013 and underwent  Procedure(s): TOTAL KNEE ARTHROPLASTY.    Patient was given perioperative antibiotics: Anti-infectives   Start     Dose/Rate Route Frequency Ordered Stop   08/09/13 1400  ceFAZolin (ANCEF) IVPB 2 g/50 mL premix     2 g 100 mL/hr over 30 Minutes Intravenous Every 6 hours 08/09/13 1207 08/09/13 2127   08/09/13 1207  fluconazole (DIFLUCAN) tablet 150 mg     150 mg Oral As needed 08/09/13 1207     08/09/13 0600  ceFAZolin (ANCEF) IVPB 2 g/50 mL premix     2 g 100 mL/hr over 30 Minutes Intravenous On call to O.R. 08/08/13 1400 08/09/13 0737       Patient was given sequential compression devices, early ambulation, and chemoprophylaxis to prevent DVT.  Patient benefited maximally from hospital stay and there were no complications.    Recent vital signs: Patient Vitals for the past 24 hrs:  BP Temp Temp src Pulse Resp SpO2 Height Weight   08/11/13 1317 - - - - - - 5\' 3"  (1.6 m) 77.6 kg (171 lb 1.2 oz)  08/11/13 1200 113/47 mmHg 98.6 F (37 C) Oral 80 18 96 % - -  08/11/13 0800 - - - - 18 - - -  08/11/13 0427 128/65 mmHg 98.2 F (36.8 C) Oral 88 18 97 % - -  08/11/13 2951 - - - - 18 - - -  08/10/13 2109 135/56 mmHg 98.4 F (36.9 C) - 96 18 - - -  08/10/13 1600 - - - - 18 - - -     Recent laboratory studies:  Recent Labs  08/10/13 0551 08/11/13 0258  WBC 10.4 10.8*  HGB 9.2* 9.0*  HCT 27.6* 28.4*  PLT 211 199  NA 137  --   K 4.0  --   CL 102  --   CO2 26  --   BUN 11  --   CREATININE 0.88  --   GLUCOSE 121*  --   CALCIUM 8.2*  --      Discharge Medications:     Medication List         aspirin 325 MG EC tablet  Take 1 tablet (325 mg total) by mouth 2 (two) times daily after a meal.     atorvastatin 10 MG tablet  Commonly known as:  LIPITOR  Take 10 mg by mouth  daily after supper.     cloNIDine 0.2 MG tablet  Commonly known as:  CATAPRES  Take 0.2 mg by mouth 2 (two) times daily. Takes 1/2 tablet in the am and 1 tablet in the pm......takes for hot flashes     fluconazole 150 MG tablet  Commonly known as:  DIFLUCAN  Take 150 mg by mouth as needed (One tablet today , then repeat in three days - ongoing Rx.).     HYDROcodone-acetaminophen 5-325 MG per tablet  Commonly known as:  NORCO/VICODIN  Take 1-2 tablets by mouth every 6 (six) hours as needed for moderate pain.     Magnesium 250 MG Tabs  Take 250 mg by mouth 2 (two) times daily.     OMEGA-3-6-9 PO  Take 5 mLs by mouth 2 (two) times daily.     omeprazole 20 MG capsule  Commonly known as:  PRILOSEC  Take 20 mg by mouth 2 (two) times daily before a meal.     OVER THE COUNTER MEDICATION  Take 3 tablets by mouth at bedtime. Hyland's Leg Cramps PM     traMADol 50 MG tablet  Commonly known as:  ULTRAM  Take 50 mg by mouth every 6 (six) hours as needed.     traZODone 50 MG tablet  Commonly known as:  DESYREL  Take 25 mg by mouth at  bedtime.     venlafaxine 75 MG tablet  Commonly known as:  EFFEXOR  Take 1 tablet (75 mg total) by mouth daily.     Vitamin D3 1000 UNITS Caps  Take 1 tablet by mouth 2 (two) times daily.        Diagnostic Studies: No results found.  Disposition: 01-Home or Self Care      Discharge Orders   Future Orders Complete By Expires   Call MD / Call 911  As directed    Comments:     If you experience chest pain or shortness of breath, CALL 911 and be transported to the hospital emergency room.  If you develope a fever above 101 F, pus (white drainage) or increased drainage or redness at the wound, or calf pain, call your surgeon's office.   Constipation Prevention  As directed    Comments:     Drink plenty of fluids.  Prune juice may be helpful.  You may use a stool softener, such as Colace (over the counter) 100 mg twice a day.  Use MiraLax (over the counter) for constipation as needed.   Diet - low sodium heart healthy  As directed    Increase activity slowly as tolerated  As directed       Follow-up Information   Follow up with DALLDORF,PETER G, MD. Call in 2 weeks.   Specialty:  Orthopedic Surgery   Contact information:   [place] [address] [phone]        Signed: Dione Housekeeper 08/11/2013, 2:40 PM

## 2013-08-12 LAB — CBC
HEMATOCRIT: 27.8 % — AB (ref 36.0–46.0)
Hemoglobin: 9 g/dL — ABNORMAL LOW (ref 12.0–15.0)
MCH: 29.7 pg (ref 26.0–34.0)
MCHC: 32.4 g/dL (ref 30.0–36.0)
MCV: 91.7 fL (ref 78.0–100.0)
Platelets: 208 10*3/uL (ref 150–400)
RBC: 3.03 MIL/uL — ABNORMAL LOW (ref 3.87–5.11)
RDW: 14.5 % (ref 11.5–15.5)
WBC: 8.5 10*3/uL (ref 4.0–10.5)

## 2013-08-12 NOTE — Progress Notes (Signed)
Physical Therapy Treatment Patient Details Name: Samantha Ali MRN: [id number] DOB: 1946/06/15 Today's Date: 08/12/2013 Time: 3244-0102 PT Time Calculation (min): 23 min  PT Assessment / Plan / Recommendation  History of Present Illness s/p RTKA   PT Comments   Pt cont's to make steady progress with mobility but did not want to increase ambulation distance because she wanted to conserve energy for d/c home.  Assisted pt to bathroom with pt performing pericare independently.  Pt states she feels more confident in herself today & is eager to d/c home.     Follow Up Recommendations  Home health PT;Supervision/Assistance - 24 hour     Does the patient have the potential to tolerate intense rehabilitation     Barriers to Discharge        Equipment Recommendations  Rolling walker with 5" wheels;3in1 (PT)    Recommendations for Other Services    Frequency 7X/week   Progress towards PT Goals Progress towards PT goals: Progressing toward goals  Plan Current plan remains appropriate    Precautions / Restrictions Precautions Precautions: Knee Restrictions RLE Weight Bearing: Weight bearing as tolerated   Pertinent Vitals/Pain 4/10 Rt knee.  Repositioned for comfort.  She reports she's had pain medication.      Mobility  Bed Mobility Overal bed mobility: Modified Independent Bed Mobility: Supine to Sit Supine to sit: Modified independent (Device/Increase time) Transfers Overall transfer level: Modified independent Equipment used: Rolling walker (2 wheeled) Transfers: Sit to/from Stand Sit to Stand: Modified independent (Device/Increase time) General transfer comment: Pt demonstrates safe hand placement.  Encouragement to allow Rt knee flexion with transition  Ambulation/Gait Ambulation/Gait assistance: Supervision Ambulation Distance (Feet): 30 Feet Assistive device: Rolling walker (2 wheeled) Gait Pattern/deviations: Step-through pattern;Decreased stride length;Decreased step  length - left Gait velocity: decreased General Gait Details: Pt only wanting to ambulate in room due to wanting to conserve energy for d/c home.  Slow but steady    Exercises Total Joint Exercises Ankle Circles/Pumps: AROM;Both;10 reps Quad Sets: AROM;Strengthening;Both;10 reps Heel Slides: AAROM;Strengthening;Right;10 reps Hip ABduction/ADduction: AROM;Strengthening;Right;10 reps Straight Leg Raises: AAROM;Strengthening;Right;10 reps Knee Flexion: AAROM;Right;5 reps;Seated Goniometric ROM: AAROM Rt knee flexion ~65 degrees in sitting     PT Goals (current goals can now be found in the care plan section) Acute Rehab PT Goals Patient Stated Goal: Home when able PT Goal Formulation: With patient Time For Goal Achievement: 08/16/13 Potential to Achieve Goals: Good  Visit Information  Last PT Received On: 08/12/13 Assistance Needed: +1 History of Present Illness: s/p RTKA    Subjective Data  Patient Stated Goal: Home when able   Cognition  Cognition Arousal/Alertness: Awake/alert Behavior During Therapy: WFL for tasks assessed/performed Overall Cognitive Status: Within Functional Limits for tasks assessed    Balance     End of Session PT - End of Session Activity Tolerance: Patient tolerated treatment well Patient left: in chair;with call bell/phone within reach Nurse Communication: Mobility status   GP     Sena Hitch 08/12/2013, 11:52 AM  Sarajane Marek, PTA [phone] 08/12/2013

## 2013-08-12 NOTE — Progress Notes (Signed)
Subjective: 3 Days Post-Op Procedure(s) (LRB): TOTAL KNEE ARTHROPLASTY (Right)  Activity level:  wbat Diet tolerance:  ok Voiding:  ok Patient reports pain as 2 on 0-10 scale.    Objective: Vital signs in last 24 hours: Temp:  [98.4 F (36.9 C)-98.6 F (37 C)] 98.4 F (36.9 C) (01/16 0500) Pulse Rate:  [80-89] 82 (01/16 0500) Resp:  [16-20] 16 (01/16 0757) BP: (113-134)/(47-64) 121/58 mmHg (01/16 0500) SpO2:  [92 %-96 %] 92 % (01/16 0500) Weight:  [77.6 kg (171 lb 1.2 oz)] 77.6 kg (171 lb 1.2 oz) (01/15 1317)  Labs:  Recent Labs  08/10/13 0551 08/11/13 0258 08/12/13 0407  HGB 9.2* 9.0* 9.0*    Recent Labs  08/11/13 0258 08/12/13 0407  WBC 10.8* 8.5  RBC 3.08* 3.03*  HCT 28.4* 27.8*  PLT 199 208    Recent Labs  08/10/13 0551  NA 137  K 4.0  CL 102  CO2 26  BUN 11  CREATININE 0.88  GLUCOSE 121*  CALCIUM 8.2*   No results found for this basename: LABPT, INR,  in the last 72 hours  Physical Exam:  Neurologically intact ABD soft Neurovascular intact Sensation intact distally Intact pulses distally Dorsiflexion/Plantar flexion intact No cellulitis present Compartment soft  Assessment/Plan:  3 Days Post-Op Procedure(s) (LRB): TOTAL KNEE ARTHROPLASTY (Right) Advance diet Up with therapy D/C IV fluids Discharge home with home health    Samantha Ali R 08/12/2013, 8:16 AM

## 2013-08-24 ENCOUNTER — Ambulatory Visit (HOSPITAL_COMMUNITY)
Admission: RE | Admit: 2013-08-24 | Discharge: 2013-08-24 | Disposition: A | Discharge status: Home / Self Care | Payer: Medicare Other | Source: Ambulatory Visit | Attending: Orthopaedic Surgery | Admitting: Orthopaedic Surgery

## 2013-08-24 ENCOUNTER — Other Ambulatory Visit (HOSPITAL_COMMUNITY): Payer: Self-pay | Admitting: Orthopaedic Surgery

## 2013-08-24 ENCOUNTER — Encounter (HOSPITAL_COMMUNITY): Payer: Self-pay | Admitting: Emergency Medicine

## 2013-08-24 ENCOUNTER — Emergency Department (HOSPITAL_COMMUNITY)
Admission: EM | Admit: 2013-08-24 | Discharge: 2013-08-24 | Disposition: A | Discharge status: Home / Self Care | Payer: Medicare Other | Attending: Emergency Medicine | Admitting: Emergency Medicine

## 2013-08-24 DIAGNOSIS — Z853 Personal history of malignant neoplasm of breast: Secondary | ICD-10-CM | POA: Insufficient documentation

## 2013-08-24 DIAGNOSIS — Z79899 Other long term (current) drug therapy: Secondary | ICD-10-CM | POA: Insufficient documentation

## 2013-08-24 DIAGNOSIS — Z7982 Long term (current) use of aspirin: Secondary | ICD-10-CM | POA: Insufficient documentation

## 2013-08-24 DIAGNOSIS — IMO0002 Reserved for concepts with insufficient information to code with codable children: Secondary | ICD-10-CM

## 2013-08-24 DIAGNOSIS — M79669 Pain in unspecified lower leg: Secondary | ICD-10-CM

## 2013-08-24 DIAGNOSIS — Z96659 Presence of unspecified artificial knee joint: Secondary | ICD-10-CM | POA: Insufficient documentation

## 2013-08-24 DIAGNOSIS — I82409 Acute embolism and thrombosis of unspecified deep veins of unspecified lower extremity: Secondary | ICD-10-CM | POA: Insufficient documentation

## 2013-08-24 DIAGNOSIS — Z8669 Personal history of other diseases of the nervous system and sense organs: Secondary | ICD-10-CM | POA: Insufficient documentation

## 2013-08-24 DIAGNOSIS — M1710 Unilateral primary osteoarthritis, unspecified knee: Secondary | ICD-10-CM | POA: Insufficient documentation

## 2013-08-24 DIAGNOSIS — G43909 Migraine, unspecified, not intractable, without status migrainosus: Secondary | ICD-10-CM | POA: Insufficient documentation

## 2013-08-24 DIAGNOSIS — K219 Gastro-esophageal reflux disease without esophagitis: Secondary | ICD-10-CM | POA: Insufficient documentation

## 2013-08-24 LAB — BASIC METABOLIC PANEL
BUN: 14 mg/dL (ref 6–23)
CO2: 26 mEq/L (ref 19–32)
Calcium: 9.4 mg/dL (ref 8.4–10.5)
Chloride: 99 mEq/L (ref 96–112)
Creatinine, Ser: 0.94 mg/dL (ref 0.50–1.10)
GFR calc non Af Amer: 61 mL/min — ABNORMAL LOW (ref >90)
GFR, EST AFRICAN AMERICAN: 71 mL/min — AB (ref >90)
Glucose, Bld: 91 mg/dL (ref 70–99)
POTASSIUM: 4.1 meq/L (ref 3.7–5.3)
SODIUM: 139 meq/L (ref 137–147)

## 2013-08-24 LAB — CBC WITH DIFFERENTIAL/PLATELET
BASOS PCT: 1 % (ref 0–1)
Basophils Absolute: 0.1 10*3/uL (ref 0.0–0.1)
Eosinophils Absolute: 0.4 10*3/uL (ref 0.0–0.7)
Eosinophils Relative: 3 % (ref 0–5)
HCT: 31.1 % — ABNORMAL LOW (ref 36.0–46.0)
Hemoglobin: 10.2 g/dL — ABNORMAL LOW (ref 12.0–15.0)
LYMPHS ABS: 1.8 10*3/uL (ref 0.7–4.0)
Lymphocytes Relative: 17 % (ref 12–46)
MCH: 29.3 pg (ref 26.0–34.0)
MCHC: 32.8 g/dL (ref 30.0–36.0)
MCV: 89.4 fL (ref 78.0–100.0)
Monocytes Absolute: 0.9 10*3/uL (ref 0.1–1.0)
Monocytes Relative: 8 % (ref 3–12)
NEUTROS ABS: 7.7 10*3/uL (ref 1.7–7.7)
NEUTROS PCT: 71 % (ref 43–77)
PLATELETS: 463 10*3/uL — AB (ref 150–400)
RBC: 3.48 MIL/uL — AB (ref 3.87–5.11)
RDW: 14.5 % (ref 11.5–15.5)
WBC: 10.9 10*3/uL — ABNORMAL HIGH (ref 4.0–10.5)

## 2013-08-24 LAB — PROTIME-INR
INR: 0.99 (ref 0.00–1.49)
Prothrombin Time: 12.9 seconds (ref 11.6–15.2)

## 2013-08-24 MED ORDER — RIVAROXABAN 15 MG PO TABS
15.0000 mg | ORAL_TABLET | Freq: Once | ORAL | Status: AC
  Administered 2013-08-24: 15 mg via ORAL
  Filled 2013-08-24: qty 1

## 2013-08-24 MED ORDER — RIVAROXABAN 20 MG PO TABS: 20.0000 mg | ORAL_TABLET | Freq: Once | ORAL | Status: DC

## 2013-08-24 MED ORDER — XARELTO VTE STARTER PACK 15 & 20 MG PO TBPK: 15.0000 mg | ORAL_TABLET | ORAL | Status: DC

## 2013-08-24 NOTE — ED Provider Notes (Signed)
  Face-to-face evaluation   History: Increased right lower leg pain for 2 days.  Physical Exam: right leg tender and swollen medial lower. Healing knee wound from recent replacement. No evident wound infection    Medical screening examination/treatment/procedure(s) were conducted as a shared visit with non-physician practitioner(s) and myself.  I personally evaluated the patient during the encounter  Richarda Blade, MD 08/25/13 [phone]

## 2013-08-24 NOTE — Progress Notes (Signed)
*  Preliminary Results* Right lower extremity venous duplex completed. Right lower extremity is positive for deep vein thrombosis involving the right popliteal vein. There is no evidence of right Baker's cyst.  Preliminary results discussed with Dr.Dalldorf and the patient has been instructed to go to the Emergency Department for treatment.  08/24/2013 4:59 PM  Samantha Ali, RVT, RDCS, RDMS

## 2013-08-24 NOTE — ED Provider Notes (Signed)
CSN: [id number]     Arrival date & time 08/24/13  1709 History   This chart was scribed for non-physician practitioner Renne Crigler, PA-C, working with Flint Melter, MD by Donne Anon, ED Scribe. This patient was seen in room TR10C/TR10C and the patient's care was started at 2101.   First MD Initiated Contact with Patient 08/24/13 2101     Chief Complaint  Patient presents with  . DVT    The history is provided by the patient. No language interpreter was used.   HPI Comments: Samantha Ali is a 68 y.o. female who presents to the Emergency Department complaining of a DVT. She had her right knee replaced on 08/09/13. She reports 2 weeks of gradual onset, gradually worsening right calf pain, swelling and redness which acutely worsened today. She had a doppler study today which confirmed a DVT it her popliteal vein. She had a DVT in her right leg in the 1990's. She denies SOB, CP or any other symptoms. She reports taking Aspirin as her only blood thinner.   Past Medical History  Diagnosis Date  . Cancer   . Breast cancer   . Clot 1996    in the right leg- in the muscle    . GERD (gastroesophageal reflux disease)   . DVT (deep venous thrombosis) 1990's    probably related to positive phlebitis  . Headache(784.0)     migraines- most often 2-3 times /week   . Arthritis     R knee  . Photophobia of both eyes     unknown etiology   Past Surgical History  Procedure Laterality Date  . Knee surgery    . Shoulder surgery    . Tubal ligation    . Sentinel lymph node biopsy  02/21/2003  . Breast surgery  1987    right lumpectomy for benign disease  . Breast lumpectomy  02/21/2003  . Mastectomy Left 2004    L parial mastectomy  . Tonsillectomy    . Eye surgery      cataracts removed -IOL  . Total knee arthroplasty Right 08/09/2013    Procedure: TOTAL KNEE ARTHROPLASTY;  Surgeon: Velna Ochs, MD;  Location: [facility] OR;  Service: Orthopedics;  Laterality: Right;   Family History   Problem Relation Age of Onset  . Heart disease Father    History  Substance Use Topics  . Smoking status: Never Smoker   . Smokeless tobacco: Never Used  . Alcohol Use: No   OB History   Grav Para Term Preterm Abortions TAB SAB Ect Mult Living                 Review of Systems  Constitutional: Negative for fever.  HENT: Negative for rhinorrhea and sore throat.   Eyes: Negative for redness.  Respiratory: Negative for cough, chest tightness, shortness of breath and wheezing.   Cardiovascular: Positive for leg swelling. Negative for chest pain.  Gastrointestinal: Negative for nausea, vomiting, abdominal pain and diarrhea.  Genitourinary: Negative for dysuria.  Musculoskeletal: Positive for myalgias.  Skin: Negative for color change and rash.  Neurological: Negative for headaches.    Allergies  Review of patient's allergies indicates no known allergies.  Home Medications   Current Outpatient Rx  Name  Route  Sig  Dispense  Refill  . aspirin EC 325 MG EC tablet   Oral   Take 1 tablet (325 mg total) by mouth 2 (two) times daily after a meal.   30 tablet  0   . atorvastatin (LIPITOR) 10 MG tablet   Oral   Take 10 mg by mouth daily after supper.          . Cholecalciferol (VITAMIN D3) 1000 UNITS CAPS   Oral   Take 1 tablet by mouth 2 (two) times daily.          . cloNIDine (CATAPRES) 0.2 MG tablet   Oral   Take 0.2 mg by mouth 2 (two) times daily. Takes 1/2 tablet in the am and 1 tablet in the pm......takes for hot flashes         . HYDROcodone-acetaminophen (NORCO/VICODIN) 5-325 MG per tablet   Oral   Take 1-2 tablets by mouth every 6 (six) hours as needed for moderate pain.   70 tablet   0   . Magnesium 250 MG TABS   Oral   Take 250 mg by mouth 2 (two) times daily.         Ernestine Conrad 3-6-9 Fatty Acids (OMEGA-3-6-9 PO)   Oral   Take 5 mLs by mouth 2 (two) times daily.          Marland Kitchen omeprazole (PRILOSEC) 20 MG capsule   Oral   Take 20 mg by mouth 2  (two) times daily before a meal.          . OVER THE COUNTER MEDICATION   Oral   Take 3 tablets by mouth at bedtime. Hyland's Leg Cramps PM         . traMADol (ULTRAM) 50 MG tablet   Oral   Take 50 mg by mouth every 6 (six) hours as needed.          . traZODone (DESYREL) 50 MG tablet   Oral   Take 25 mg by mouth at bedtime.          Marland Kitchen venlafaxine (EFFEXOR) 75 MG tablet   Oral   Take 1 tablet (75 mg total) by mouth daily.   90 tablet   3     Please contact patient and dispense ASAP, this fil ...   . fluconazole (DIFLUCAN) 150 MG tablet   Oral   Take 150 mg by mouth as needed (One tablet today , then repeat in three days - ongoing Rx.).           BP 132/61  Pulse 92  Temp(Src) 98.6 F (37 C) (Oral)  Resp 18  SpO2 98%  Physical Exam  Nursing note and vitals reviewed. Constitutional: She appears well-developed and well-nourished. No distress.  HENT:  Head: Normocephalic and atraumatic.  Eyes: Conjunctivae are normal.  Neck: Neck supple. No tracheal deviation present.  Cardiovascular: Normal rate, regular rhythm, normal heart sounds and intact distal pulses.  Exam reveals no gallop and no friction rub.   No murmur heard. Pulmonary/Chest: Effort normal and breath sounds normal. No respiratory distress. She has no wheezes. She has no rales.  Musculoskeletal: Normal range of motion. She exhibits edema and tenderness.  Moderate non-pitting edema L calf with generalized tenderness. Minor hyperpigmentation lateral ankle. 2+ pedal pulses bilaterally. Compartments soft.   Neurological: She is alert.  Skin: Skin is warm and dry.  Psychiatric: She has a normal mood and affect. Her behavior is normal.    ED Course  Procedures (including critical care time) DIAGNOSTIC STUDIES: Oxygen Saturation is 98% on RA, normal by my interpretation.    COORDINATION OF CARE: 9:01 PM Discussed treatment plan which includes consult with Dr. Eulis Foster with pt at bedside and pt  agreed to  plan.   9:08 PM Case discussed with Dr. Eulis Foster, who evaluated pt.    Labs Review Labs Reviewed  CBC WITH DIFFERENTIAL - Abnormal; Notable for the following:    WBC 10.9 (*)    RBC 3.48 (*)    Hemoglobin 10.2 (*)    HCT 31.1 (*)    Platelets 463 (*)    All other components within normal limits  BASIC METABOLIC PANEL - Abnormal; Notable for the following:    GFR calc non Af Amer 61 (*)    GFR calc Af Amer 71 (*)    All other components within normal limits  PROTIME-INR   Imaging Review No results found.  EKG Interpretation   None      Vital signs reviewed and are as follows: Filed Vitals:   08/24/13 1725  BP: 132/61  Pulse: 92  Temp: 98.6 F (37 C)  Resp: 18   I spoke with Dr. Tamera Punt who was on call for Dr. Rhona Raider. Dr. Tamera Punt spoke with Dr. Rhona Raider directly. They agree with plan to start patient on Xarelto. She was given this in the ED. She was sent home with starter pack. Dosing instructions discussed with patient. Importance of PCP followup was stressed.   MDM   1. Deep venous thrombosis    Patient with DVT, confirmed on ultrasound, after knee surgery. Patient started on Zaroxolyn. She has no indication of pulmonary embolism tonight. Do not feel CT of chest is indicated given her current presentation. I spoke with patient's orthopedic surgeons who are in agreement with plan to start patient on anticoagulation. No signs of compartment syndrome.  I personally performed the services described in this documentation, which was scribed in my presence. The recorded information has been reviewed and is accurate.    Carlisle Cater, PA-C 08/24/13 2325

## 2013-08-24 NOTE — ED Notes (Signed)
Spoke with PA concerning pt and verbal orders received to start at triage.

## 2013-08-24 NOTE — ED Notes (Signed)
Patient presents with preliminary paperwork regarding DVT in the popliteal area on the right.  Has a history of right knee replacement.   PA in to see patient and call in to orthopedic MD has been made.

## 2013-08-24 NOTE — Progress Notes (Signed)
Provided patient with Xarelto discharge education kit.  Discussed how to take Xarelto - 15mg twice daily with food x 21 days then 20mg daily thereafter as directed by her physician.  Patient advised to watch for signs of bleeding or bruising while on Xarelto.  Patient voiced understanding and questions were answered.   , PharmD, BCPS Clinical Pharmacist Pager [phone]  

## 2013-08-24 NOTE — Discharge Instructions (Signed)
Please read and follow all provided instructions.  Your diagnoses today include:  1. Deep venous thrombosis     Tests performed today include:  Blood counts and electrolyes  Vital signs. See below for your results today.   Medications prescribed:   Xarelto - blood thinner medication  Take any prescribed medications only as directed.  Home care instructions:  Follow any educational materials contained in this packet.  Follow-up instructions: Please follow-up with your primary care provider in the next 3 days for further evaluation of your symptoms. If you do not have a primary care doctor -- see below for referral information.   Return instructions:   Please return to the Emergency Department if you experience worsening symptoms.   Please return if you have any other emergent concerns.  Additional Information:  Your vital signs today were: BP 132/61   Pulse 92   Temp(Src) 98.6 F (37 C) (Oral)   Resp 18   SpO2 98% If your blood pressure (BP) was elevated above 135/85 this visit, please have this repeated by your doctor within one month. --------------

## 2013-08-24 NOTE — ED Notes (Signed)
Pt c/o R leg pain, swelling, and redness x 2 weeks since having R knee replacement.  Pt states she was sent to the ED for a DVT after having + doppler study today.

## 2013-08-25 NOTE — ED Provider Notes (Deleted)
Medical screening examination/treatment/procedure(s) were performed by non-physician practitioner and as supervising physician I was immediately available for consultation/collaboration.  Richarda Blade, MD 08/25/13 Laureen Abrahams

## 2013-11-14 ENCOUNTER — Encounter (HOSPITAL_COMMUNITY): Payer: Self-pay | Admitting: Emergency Medicine

## 2013-11-14 ENCOUNTER — Other Ambulatory Visit (HOSPITAL_COMMUNITY): Payer: Self-pay | Admitting: Orthopaedic Surgery

## 2013-11-14 ENCOUNTER — Observation Stay (HOSPITAL_COMMUNITY)
Admission: EM | Admit: 2013-11-14 | Discharge: 2013-11-15 | Disposition: A | Discharge status: Home / Self Care | Payer: Medicare Other | Attending: Internal Medicine | Admitting: Internal Medicine

## 2013-11-14 ENCOUNTER — Ambulatory Visit (HOSPITAL_COMMUNITY)
Admission: RE | Admit: 2013-11-14 | Discharge: 2013-11-14 | Disposition: A | Discharge status: Home / Self Care | Payer: Medicare Other | Source: Ambulatory Visit | Attending: Orthopaedic Surgery | Admitting: Orthopaedic Surgery

## 2013-11-14 DIAGNOSIS — Z9010 Acquired absence of unspecified breast and nipple: Secondary | ICD-10-CM | POA: Insufficient documentation

## 2013-11-14 DIAGNOSIS — R079 Chest pain, unspecified: Secondary | ICD-10-CM

## 2013-11-14 DIAGNOSIS — Z7901 Long term (current) use of anticoagulants: Secondary | ICD-10-CM | POA: Insufficient documentation

## 2013-11-14 DIAGNOSIS — G43909 Migraine, unspecified, not intractable, without status migrainosus: Secondary | ICD-10-CM | POA: Insufficient documentation

## 2013-11-14 DIAGNOSIS — R002 Palpitations: Secondary | ICD-10-CM

## 2013-11-14 DIAGNOSIS — I82409 Acute embolism and thrombosis of unspecified deep veins of unspecified lower extremity: Secondary | ICD-10-CM

## 2013-11-14 DIAGNOSIS — Z96659 Presence of unspecified artificial knee joint: Secondary | ICD-10-CM | POA: Insufficient documentation

## 2013-11-14 DIAGNOSIS — D649 Anemia, unspecified: Secondary | ICD-10-CM | POA: Insufficient documentation

## 2013-11-14 DIAGNOSIS — I82A11 Acute embolism and thrombosis of right axillary vein: Secondary | ICD-10-CM

## 2013-11-14 DIAGNOSIS — Z7982 Long term (current) use of aspirin: Secondary | ICD-10-CM | POA: Insufficient documentation

## 2013-11-14 DIAGNOSIS — M79609 Pain in unspecified limb: Secondary | ICD-10-CM

## 2013-11-14 DIAGNOSIS — I824Y9 Acute embolism and thrombosis of unspecified deep veins of unspecified proximal lower extremity: Principal | ICD-10-CM | POA: Insufficient documentation

## 2013-11-14 DIAGNOSIS — Z853 Personal history of malignant neoplasm of breast: Secondary | ICD-10-CM | POA: Insufficient documentation

## 2013-11-14 DIAGNOSIS — M7989 Other specified soft tissue disorders: Secondary | ICD-10-CM

## 2013-11-14 DIAGNOSIS — I10 Essential (primary) hypertension: Secondary | ICD-10-CM | POA: Insufficient documentation

## 2013-11-14 LAB — I-STAT CHEM 8, ED
BUN: 13 mg/dL (ref 6–23)
CALCIUM ION: 1.18 mmol/L (ref 1.13–1.30)
CREATININE: 1.2 mg/dL — AB (ref 0.50–1.10)
Chloride: 102 mEq/L (ref 96–112)
Glucose, Bld: 92 mg/dL (ref 70–99)
HCT: 35 % — ABNORMAL LOW (ref 36.0–46.0)
Hemoglobin: 11.9 g/dL — ABNORMAL LOW (ref 12.0–15.0)
Potassium: 3.8 mEq/L (ref 3.7–5.3)
SODIUM: 141 meq/L (ref 137–147)
TCO2: 28 mmol/L (ref 0–100)

## 2013-11-14 LAB — CBC
HCT: 35.1 % — ABNORMAL LOW (ref 36.0–46.0)
HEMOGLOBIN: 10.9 g/dL — AB (ref 12.0–15.0)
MCH: 26.7 pg (ref 26.0–34.0)
MCHC: 31.1 g/dL (ref 30.0–36.0)
MCV: 86 fL (ref 78.0–100.0)
Platelets: 313 10*3/uL (ref 150–400)
RBC: 4.08 MIL/uL (ref 3.87–5.11)
RDW: 15.3 % (ref 11.5–15.5)
WBC: 10.2 10*3/uL (ref 4.0–10.5)

## 2013-11-14 NOTE — ED Notes (Signed)
Pt reports that she was sent to the ER by her PCP because she has formed a second blood clot following knee surgery and has already been started on xyrelto.  Pt states here PCP said she may need a filter and to prepare to stay overnight

## 2013-11-14 NOTE — Progress Notes (Addendum)
*  PRELIMINARY RESULTS* Vascular Ultrasound Right lower extremity venous duplex has been completed.  Preliminary findings: DVT noted in a deep branch of the right popliteal vein. Previous study in January 2015 showed DVT to be in popliteal vein itself.   Called report to Echo.   Chapman Fitch RVT 11/14/2013, 4:39 PM

## 2013-11-14 NOTE — ED Provider Notes (Signed)
CSN: [id number]     Arrival date & time 11/14/13  1925 History   First MD Initiated Contact with Patient 11/14/13 2329     Chief Complaint  Patient presents with  . DVT     (Consider location/radiation/quality/duration/timing/severity/associated sxs/prior Treatment) HPI Hx per PT - R LE pain and swelling.  Is on Xarelto since Jan - no missed doses. R knee surgery in Jan and developed RLE DVT a few weeks later.  She had been doing well until last night when she noticed new pain and swelling. No associated SOB or CP. She called her Orthopedic and had US performed this afternoon. PCP referred PT to the ER for further evaluation and was told that she will get an IVC filter in the morning.   Past Medical History  Diagnosis Date  . Cancer   . Breast cancer   . Clot 1996    in the right leg- in the muscle    . GERD (gastroesophageal reflux disease)   . DVT (deep venous thrombosis) 1990's    probably related to positive phlebitis  . Headache(784.0)     migraines- most often 2-3 times /week   . Arthritis     R knee  . Photophobia of both eyes     unknown etiology   Past Surgical History  Procedure Laterality Date  . Knee surgery    . Shoulder surgery    . Tubal ligation    . Sentinel lymph node biopsy  02/21/2003  . Breast surgery  1987    right lumpectomy for benign disease  . Breast lumpectomy  02/21/2003  . Mastectomy Left 2004    L parial mastectomy  . Tonsillectomy    . Eye surgery      cataracts removed -IOL  . Total knee arthroplasty Right 08/09/2013    Procedure: TOTAL KNEE ARTHROPLASTY;  Surgeon: Hessie Dibble, MD;  Location: [facility];  Service: Orthopedics;  Laterality: Right;   Family History  Problem Relation Age of Onset  . Heart disease Father    History  Substance Use Topics  . Smoking status: Never Smoker   . Smokeless tobacco: Never Used  . Alcohol Use: No   OB History   Grav Para Term Preterm Abortions TAB SAB Ect Mult Living                 Review  of Systems  Constitutional: Negative for fever and chills.  Respiratory: Negative for shortness of breath.   Cardiovascular: Positive for leg swelling. Negative for chest pain.  Gastrointestinal: Negative for nausea, vomiting and abdominal pain.  Genitourinary: Negative for flank pain.  Musculoskeletal: Negative for back pain, neck pain and neck stiffness.  Skin: Negative for rash.  Neurological: Negative for weakness and numbness.  All other systems reviewed and are negative.     Allergies  Review of patient's allergies indicates no known allergies.  Home Medications   Prior to Admission medications   Medication Sig Start Date End Date Taking? Authorizing Provider  aspirin EC 81 MG tablet Take 81 mg by mouth daily.   Yes Historical Provider, MD  atorvastatin (LIPITOR) 10 MG tablet Take 10 mg by mouth daily after supper.    Yes   Cholecalciferol (VITAMIN D3) 1000 UNITS CAPS Take 1 tablet by mouth 2 (two) times daily.    Yes Historical Provider, MD  cloNIDine (CATAPRES) 0.2 MG tablet Take 0.2 mg by mouth 2 (two) times daily. Takes 1/2 tablet in the am and 1 tablet  in the pm......takes for hot flashes   Yes Historical Provider, MD  fluconazole (DIFLUCAN) 150 MG tablet Take 150 mg by mouth daily as needed (One tablet today , then repeat in three days - ongoing Rx.).  03/18/13  Yes Melina Schools, MD  Magnesium 250 MG TABS Take 250 mg by mouth 2 (two) times daily.   Yes Historical Provider, MD  omeprazole (PRILOSEC) 20 MG capsule Take 20 mg by mouth 2 (two) times daily before a meal.    Yes Historical Provider, MD  OVER THE COUNTER MEDICATION Take 3 tablets by mouth at bedtime. Hyland's Leg Cramps PM 03/31/13  Yes Historical Provider, MD  rivaroxaban (XARELTO) 20 MG TABS tablet Take 20 mg by mouth daily with breakfast.   Yes Historical Provider, MD  traZODone (DESYREL) 50 MG tablet Take 25 mg by mouth at bedtime.    Yes   venlafaxine (EFFEXOR) 75 MG tablet Take 1 tablet (75 mg total) by  mouth daily. 03/09/13  Yes Chauncey Cruel, MD  HYDROcodone-acetaminophen (NORCO/VICODIN) 5-325 MG per tablet Take 1-2 tablets by mouth every 6 (six) hours as needed for moderate pain. 08/11/13   Dione Housekeeper, PA-C  Omega 3-6-9 Fatty Acids (OMEGA-3-6-9 PO) Take 5 mLs by mouth 2 (two) times daily as needed.  02/08/13   Historical Provider, MD  traMADol (ULTRAM) 50 MG tablet Take 50 mg by mouth every 6 (six) hours as needed for moderate pain.  05/26/13   Kathyrn Lass, MD   BP 145/79  Pulse 91  Temp(Src) 99.3 F (37.4 C) (Oral)  Resp 20  Ht 5\' 3"  (1.6 m)  Wt 169 lb (76.658 kg)  BMI 29.94 kg/m2  SpO2 97% Physical Exam  Constitutional: She is oriented to person, place, and time. She appears well-developed and well-nourished.  HENT:  Head: Normocephalic and atraumatic.  Eyes: EOM are normal. Pupils are equal, round, and reactive to light.  Neck: Neck supple.  Cardiovascular: Normal rate, regular rhythm and intact distal pulses.   Pulmonary/Chest: Effort normal and breath sounds normal. No respiratory distress.  Abdominal: Soft. Bowel sounds are normal. She exhibits no distension. There is no tenderness.  Musculoskeletal: Normal range of motion.  RLE calf tenderness and swelling, distal N/V intact. Healing surgical scar over her knee.   Neurological: She is alert and oriented to person, place, and time.  Skin: Skin is warm and dry.    ED Course  Procedures (including critical care time) Labs Review Labs Reviewed  CBC - Abnormal; Notable for the following:    Hemoglobin 10.9 (*)    HCT 35.1 (*)    All other components within normal limits  I-STAT CHEM 8, ED - Abnormal; Notable for the following:    Creatinine, Ser 1.20 (*)    Hemoglobin 11.9 (*)    HCT 35.0 (*)    All other components within normal limits    12:16 AM consulted [facility] hospital[facility].  Dr Ernestina Patches evaluated patient bedside. Plan Lovenox now and admit OBS.   MDM   Dx: DVT  Patient has developed recurrent DVT on  Xarelto Labs obtained and reviewed as above Lovenox provided Medical consult admission  Teressa Lower, MD 11/15/13 [phone]

## 2013-11-15 ENCOUNTER — Encounter (HOSPITAL_COMMUNITY): Payer: Self-pay | Admitting: *Deleted

## 2013-11-15 DIAGNOSIS — R002 Palpitations: Secondary | ICD-10-CM

## 2013-11-15 DIAGNOSIS — I82409 Acute embolism and thrombosis of unspecified deep veins of unspecified lower extremity: Secondary | ICD-10-CM

## 2013-11-15 DIAGNOSIS — M7989 Other specified soft tissue disorders: Secondary | ICD-10-CM

## 2013-11-15 DIAGNOSIS — I82A19 Acute embolism and thrombosis of unspecified axillary vein: Secondary | ICD-10-CM

## 2013-11-15 DIAGNOSIS — R079 Chest pain, unspecified: Secondary | ICD-10-CM

## 2013-11-15 LAB — COMPREHENSIVE METABOLIC PANEL
ALT: 22 U/L (ref 0–35)
AST: 24 U/L (ref 0–37)
Albumin: 3.9 g/dL (ref 3.5–5.2)
Alkaline Phosphatase: 101 U/L (ref 39–117)
BUN: 13 mg/dL (ref 6–23)
CHLORIDE: 103 meq/L (ref 96–112)
CO2: 27 meq/L (ref 19–32)
Calcium: 9.6 mg/dL (ref 8.4–10.5)
Creatinine, Ser: 0.92 mg/dL (ref 0.50–1.10)
GFR calc Af Amer: 72 mL/min — ABNORMAL LOW (ref >90)
GFR, EST NON AFRICAN AMERICAN: 63 mL/min — AB (ref >90)
Glucose, Bld: 91 mg/dL (ref 70–99)
POTASSIUM: 3.8 meq/L (ref 3.7–5.3)
SODIUM: 143 meq/L (ref 137–147)
Total Bilirubin: 0.4 mg/dL (ref 0.3–1.2)
Total Protein: 7.5 g/dL (ref 6.0–8.3)

## 2013-11-15 LAB — VITAMIN B12: Vitamin B-12: 651 pg/mL (ref 211–911)

## 2013-11-15 LAB — CBC WITH DIFFERENTIAL/PLATELET
Basophils Absolute: 0.1 10*3/uL (ref 0.0–0.1)
Basophils Relative: 1 % (ref 0–1)
EOS ABS: 0.3 10*3/uL (ref 0.0–0.7)
Eosinophils Relative: 4 % (ref 0–5)
HCT: 36.4 % (ref 36.0–46.0)
HEMOGLOBIN: 11.5 g/dL — AB (ref 12.0–15.0)
Lymphocytes Relative: 28 % (ref 12–46)
Lymphs Abs: 2.5 10*3/uL (ref 0.7–4.0)
MCH: 27.1 pg (ref 26.0–34.0)
MCHC: 31.6 g/dL (ref 30.0–36.0)
MCV: 85.6 fL (ref 78.0–100.0)
MONOS PCT: 9 % (ref 3–12)
Monocytes Absolute: 0.8 10*3/uL (ref 0.1–1.0)
Neutro Abs: 5.4 10*3/uL (ref 1.7–7.7)
Neutrophils Relative %: 58 % (ref 43–77)
Platelets: 326 10*3/uL (ref 150–400)
RBC: 4.25 MIL/uL (ref 3.87–5.11)
RDW: 15.6 % — ABNORMAL HIGH (ref 11.5–15.5)
WBC: 9.1 10*3/uL (ref 4.0–10.5)

## 2013-11-15 LAB — IRON AND TIBC
Iron: 36 ug/dL — ABNORMAL LOW (ref 42–135)
Saturation Ratios: 9 % — ABNORMAL LOW (ref 20–55)
TIBC: 383 ug/dL (ref 250–470)
UIBC: 347 ug/dL (ref 125–400)

## 2013-11-15 LAB — HOMOCYSTEINE: Homocysteine: 9.6 umol/L (ref 4.0–15.4)

## 2013-11-15 LAB — RETICULOCYTES
RBC.: 4.2 MIL/uL (ref 3.87–5.11)
Retic Count, Absolute: 54.6 10*3/uL (ref 19.0–186.0)
Retic Ct Pct: 1.3 % (ref 0.4–3.1)

## 2013-11-15 LAB — ANTITHROMBIN III: AntiThromb III Func: 115 % (ref 75–120)

## 2013-11-15 LAB — FOLATE: Folate: 8.8 ng/mL

## 2013-11-15 LAB — FERRITIN: Ferritin: 19 ng/mL (ref 10–291)

## 2013-11-15 MED ORDER — ASPIRIN EC 81 MG PO TBEC
81.0000 mg | DELAYED_RELEASE_TABLET | Freq: Every day | ORAL | Status: DC
  Administered 2013-11-15: 81 mg via ORAL
  Filled 2013-11-15: qty 1

## 2013-11-15 MED ORDER — TRAMADOL HCL 50 MG PO TABS: 50.0000 mg | ORAL_TABLET | Freq: Four times a day (QID) | ORAL | Status: DC | PRN

## 2013-11-15 MED ORDER — ENOXAPARIN SODIUM 40 MG/0.4ML ~~LOC~~ SOLN
40.0000 mg | Freq: Once | SUBCUTANEOUS | Status: DC
Start: 2013-11-15 — End: 2013-11-15

## 2013-11-15 MED ORDER — ENOXAPARIN (LOVENOX) PATIENT EDUCATION KIT
PACK | Freq: Once | Status: DC
  Filled 2013-11-15: qty 1

## 2013-11-15 MED ORDER — TRAZODONE 25 MG HALF TABLET
25.0000 mg | ORAL_TABLET | Freq: Every day | ORAL | Status: DC
  Administered 2013-11-15: 25 mg via ORAL
  Filled 2013-11-15 (×2): qty 1

## 2013-11-15 MED ORDER — OMEGA-3-ACID ETHYL ESTERS 1 G PO CAPS
1.0000 g | ORAL_CAPSULE | Freq: Two times a day (BID) | ORAL | Status: DC
  Filled 2013-11-15 (×3): qty 1

## 2013-11-15 MED ORDER — ENOXAPARIN SODIUM 100 MG/ML ~~LOC~~ SOLN
1.0000 mg/kg | SUBCUTANEOUS | Status: AC
Start: 2013-11-15 — End: 2013-11-15
  Administered 2013-11-15: 75 mg via SUBCUTANEOUS
  Filled 2013-11-15: qty 1

## 2013-11-15 MED ORDER — VITAMIN D 1000 UNITS PO TABS
1000.0000 [IU] | ORAL_TABLET | Freq: Two times a day (BID) | ORAL | Status: DC
  Administered 2013-11-15: 1000 [IU] via ORAL
  Filled 2013-11-15 (×2): qty 1

## 2013-11-15 MED ORDER — ENOXAPARIN SODIUM 120 MG/0.8ML ~~LOC~~ SOLN: 120.0000 mg | SUBCUTANEOUS | Status: DC

## 2013-11-15 MED ORDER — CLONIDINE HCL 0.2 MG PO TABS
0.2000 mg | ORAL_TABLET | Freq: Two times a day (BID) | ORAL | Status: DC
  Administered 2013-11-15: 0.2 mg via ORAL
  Filled 2013-11-15 (×2): qty 1

## 2013-11-15 MED ORDER — ENOXAPARIN SODIUM 80 MG/0.8ML ~~LOC~~ SOLN
80.0000 mg | Freq: Two times a day (BID) | SUBCUTANEOUS | Status: DC
  Administered 2013-11-15: 80 mg via SUBCUTANEOUS
  Filled 2013-11-15 (×2): qty 0.8

## 2013-11-15 MED ORDER — SODIUM CHLORIDE 0.9 % IV SOLN: 250.0000 mL | INTRAVENOUS | Status: DC | PRN

## 2013-11-15 MED ORDER — VENLAFAXINE HCL ER 75 MG PO CP24
75.0000 mg | ORAL_CAPSULE | Freq: Every day | ORAL | Status: DC
  Administered 2013-11-15: 75 mg via ORAL
  Filled 2013-11-15: qty 1

## 2013-11-15 MED ORDER — PANTOPRAZOLE SODIUM 40 MG PO TBEC
40.0000 mg | DELAYED_RELEASE_TABLET | Freq: Every day | ORAL | Status: DC
  Administered 2013-11-15: 40 mg via ORAL
  Filled 2013-11-15: qty 1

## 2013-11-15 MED ORDER — HYDROCODONE-ACETAMINOPHEN 5-325 MG PO TABS
1.0000 | ORAL_TABLET | ORAL | Status: DC | PRN
  Administered 2013-11-15: 1 via ORAL
  Filled 2013-11-15: qty 1

## 2013-11-15 MED ORDER — MAGNESIUM OXIDE 400 (241.3 MG) MG PO TABS
400.0000 mg | ORAL_TABLET | Freq: Two times a day (BID) | ORAL | Status: DC
  Administered 2013-11-15 (×2): 400 mg via ORAL
  Filled 2013-11-15 (×3): qty 1

## 2013-11-15 MED ORDER — ENOXAPARIN SODIUM 40 MG/0.4ML ~~LOC~~ SOLN
40.0000 mg | Freq: Once | SUBCUTANEOUS | Status: AC
  Administered 2013-11-15: 40 mg via SUBCUTANEOUS
  Filled 2013-11-15: qty 0.4

## 2013-11-15 MED ORDER — SODIUM CHLORIDE 0.9 % IJ SOLN
3.0000 mL | Freq: Two times a day (BID) | INTRAMUSCULAR | Status: DC
  Administered 2013-11-15 (×2): 3 mL via INTRAVENOUS

## 2013-11-15 MED ORDER — ENOXAPARIN SODIUM 80 MG/0.8ML ~~LOC~~ SOLN
75.0000 mg | Freq: Two times a day (BID) | SUBCUTANEOUS | Status: DC
  Filled 2013-11-15 (×2): qty 0.8

## 2013-11-15 MED ORDER — ATORVASTATIN CALCIUM 10 MG PO TABS
10.0000 mg | ORAL_TABLET | Freq: Every day | ORAL | Status: DC
  Filled 2013-11-15: qty 1

## 2013-11-15 MED ORDER — SODIUM CHLORIDE 0.9 % IJ SOLN
3.0000 mL | INTRAMUSCULAR | Status: DC | PRN
Start: 2013-11-15 — End: 2013-11-15

## 2013-11-15 NOTE — Care Management Note (Signed)
    Page 1 of 1   11/15/2013     2:04:28 PM CARE MANAGEMENT NOTE 11/15/2013  Patient:  Samantha Ali, Samantha Ali   Account Number:  [id number]  Date Initiated:  11/15/2013  Documentation initiated by:  Tomi Bamberger  Subjective/Objective Assessment:   dx dvt rle  admit- lives with spouse.     Action/Plan:   Anticipated DC Date:  11/15/2013   Anticipated DC Plan:  HOME/SELF CARE         Choice offered to / List presented to:             Status of service:  Completed, signed off Medicare Important Message given?   (If response is "NO", the following Medicare IM given date fields will be blank) Date Medicare IM given:   Date Additional Medicare IM given:    Discharge Disposition:  HOME/SELF CARE  Per UR Regulation:  Reviewed for med. necessity/level of care/duration of stay  If discussed at [place] of Stay Meetings, dates discussed:    Comments:  11/15/13 1218 Tomi Bamberger RN, BSN [phone] patient lives with spouse, she is for dc today and will need lovenox, NCM awaiting benefits check on lovenox 80mg  bid and also 120mg  bid. NCM informed patient of her co pay for generic brand , which is the only brand that is covered. $6 and [facility] has the generic lovenox in stock.

## 2013-11-15 NOTE — H&P (Signed)
Hospitalist Admission History and Physical  Patient name: Samantha Ali Medical record number: [id number] Date of birth: Sep 24, 1945 Age: 68 y.o. Gender: female  Primary Care Provider: Tawanna Solo, MD  Chief Complaint: DVT History of Present Illness:This is a 68 y.o. year old female with noted prior hx/o DVT in 1990s, R TKR 07/2013 presenting with DVT recurrence. Pt states that she developed a DVT 2 weeks after TKR 07/2013. Pt was placed on xarelto for treatment. Pt has been compliant with medication. States that she has had progressive RLE pain and swelling over last 2-3 days. No fever or chills. Followed up with PCP about the issue. Had LE u/s done earlier today showing new clot on imaging. Per pt, she was instructed to go to hospital to have IVC filter placed.  Pt denies any CP, SOB, nausea,vomiting, hemiparesis, confusion.  Hemodynamically stable in the ER. Lab work essentially WNL.    Patient Active Problem List   Diagnosis Date Noted  . DVT of axillary vein, acute right 11/15/2013  . DVT (deep venous thrombosis) 11/15/2013  . Total knee replacement status, right 08/09/2013  . Chest pain 08/04/2012  . Palpitations 08/04/2012  . Septic joint of right knee joint 06/01/2011  . Breast cancer 05/29/2011   Past Medical History: Past Medical History  Diagnosis Date  . Cancer   . Breast cancer   . Clot 1996    in the right leg- in the muscle    . GERD (gastroesophageal reflux disease)   . DVT (deep venous thrombosis) 1990's    probably related to positive phlebitis  . Headache(784.0)     migraines- most often 2-3 times /week   . Arthritis     R knee  . Photophobia of both eyes     unknown etiology    Past Surgical History: Past Surgical History  Procedure Laterality Date  . Knee surgery    . Shoulder surgery    . Tubal ligation    . Sentinel lymph node biopsy  02/21/2003  . Breast surgery  1987    right lumpectomy for benign disease  . Breast lumpectomy  02/21/2003  .  Mastectomy Left 2004    L parial mastectomy  . Tonsillectomy    . Eye surgery      cataracts removed -IOL  . Total knee arthroplasty Right 08/09/2013    Procedure: TOTAL KNEE ARTHROPLASTY;  Surgeon: Hessie Dibble, MD;  Location: [facility];  Service: Orthopedics;  Laterality: Right;    Social History: History   Social History  . Marital Status: Married    Spouse Name: N/A    Number of Children: N/A  . Years of Education: N/A   Occupational History  . collections    Social History Main Topics  . Smoking status: Never Smoker   . Smokeless tobacco: Never Used  . Alcohol Use: No  . Drug Use: No  . Sexual Activity: None   Other Topics Concern  . None   Social History Narrative  . None    Family History: Family History  Problem Relation Age of Onset  . Heart disease Father     Allergies: No Known Allergies  Current Facility-Administered Medications  Medication Dose Route Frequency Provider Last Rate Last Dose  . 0.9 %  sodium chloride infusion  250 mL Intravenous PRN Shanda Howells, MD      . aspirin EC tablet 81 mg  81 mg Oral Daily Shanda Howells, MD      . atorvastatin (LIPITOR) tablet  10 mg  10 mg Oral QPC supper Shanda Howells, MD      . cloNIDine (CATAPRES) tablet 0.2 mg  0.2 mg Oral BID Shanda Howells, MD      . enoxaparin (LOVENOX) injection 75 mg  75 mg Subcutaneous BID Shanda Howells, MD      . HYDROcodone-acetaminophen (NORCO/VICODIN) 5-325 MG per tablet 1-2 tablet  1-2 tablet Oral Q4H PRN Shanda Howells, MD      . Magnesium TABS 250 mg  250 mg Oral BID Shanda Howells, MD      . Omega-3-6-9 CAPS 1 capsule  1 capsule Oral BID Shanda Howells, MD      . pantoprazole (PROTONIX) EC tablet 40 mg  40 mg Oral Daily Shanda Howells, MD      . sodium chloride 0.9 % injection 3 mL  3 mL Intravenous Q12H Shanda Howells, MD      . sodium chloride 0.9 % injection 3 mL  3 mL Intravenous PRN Shanda Howells, MD      . traMADol Veatrice Bourbon) tablet 50 mg  50 mg Oral Q6H PRN Shanda Howells, MD       . traZODone (DESYREL) tablet 25 mg  25 mg Oral QHS Shanda Howells, MD      . venlafaxine [facility]) tablet 75 mg  75 mg Oral Daily Shanda Howells, MD      . Vitamin D3 CAPS 1,000 Units  1,000 Units Oral BID Shanda Howells, MD       Current Outpatient Prescriptions  Medication Sig Dispense Refill  . aspirin EC 81 MG tablet Take 81 mg by mouth daily.      Marland Kitchen atorvastatin (LIPITOR) 10 MG tablet Take 10 mg by mouth daily after supper.       . Cholecalciferol (VITAMIN D3) 1000 UNITS CAPS Take 1 tablet by mouth 2 (two) times daily.       . cloNIDine (CATAPRES) 0.2 MG tablet Take 0.2 mg by mouth 2 (two) times daily. Takes 1/2 tablet in the am and 1 tablet in the pm......takes for hot flashes      . fluconazole (DIFLUCAN) 150 MG tablet Take 150 mg by mouth daily as needed (One tablet today , then repeat in three days - ongoing Rx.).       . Magnesium 250 MG TABS Take 250 mg by mouth 2 (two) times daily.      Marland Kitchen omeprazole (PRILOSEC) 20 MG capsule Take 20 mg by mouth 2 (two) times daily before a meal.       . OVER THE COUNTER MEDICATION Take 3 tablets by mouth at bedtime. Hyland's Leg Cramps PM      . rivaroxaban (XARELTO) 20 MG TABS tablet Take 20 mg by mouth daily with breakfast.      . traZODone (DESYREL) 50 MG tablet Take 25 mg by mouth at bedtime.       Marland Kitchen venlafaxine (EFFEXOR) 75 MG tablet Take 1 tablet (75 mg total) by mouth daily.  90 tablet  3  . HYDROcodone-acetaminophen (NORCO/VICODIN) 5-325 MG per tablet Take 1-2 tablets by mouth every 6 (six) hours as needed for moderate pain.  70 tablet  0  . Omega 3-6-9 Fatty Acids (OMEGA-3-6-9 PO) Take 5 mLs by mouth 2 (two) times daily as needed.       . traMADol (ULTRAM) 50 MG tablet Take 50 mg by mouth every 6 (six) hours as needed for moderate pain.        Review Of Systems: 12 point ROS negative except as  noted above in HPI.  Physical Exam: Filed Vitals:   11/15/13 0015  BP: 148/75  Pulse: 86  Temp:   Resp: 26    General: alert and  cooperative HEENT: PERRLA and extra ocular movement intact Heart: S1, S2 normal, no murmur, rub or gallop, regular rate and rhythm Lungs: clear to auscultation, no wheezes or rales and unlabored breathing Abdomen: abdomen is soft without significant tenderness, masses, organomegaly or guarding Extremities: R popliteal/calf TTP w/ mild swelling/erythema  Skin:as above  Neurology: normal without focal findings  Labs and Imaging: Lab Results  Component Value Date/Time   NA 141 11/14/2013 11:49 PM   NA 143 05/30/2013 10:40 AM   K 3.8 11/14/2013 11:49 PM   K 4.6 05/30/2013 10:40 AM   CL 102 11/14/2013 11:49 PM   CL 105 05/05/2012 10:58 AM   CO2 26 08/24/2013  5:52 PM   CO2 27 05/30/2013 10:40 AM   BUN 13 11/14/2013 11:49 PM   BUN 14.4 05/30/2013 10:40 AM   CREATININE 1.20* 11/14/2013 11:49 PM   CREATININE 1.1 05/30/2013 10:40 AM   GLUCOSE 92 11/14/2013 11:49 PM   GLUCOSE 98 05/30/2013 10:40 AM   GLUCOSE 93 05/05/2012 10:58 AM   Lab Results  Component Value Date   WBC 10.2 11/14/2013   HGB 11.9* 11/14/2013   HCT 35.0* 11/14/2013   MCV 86.0 11/14/2013   PLT 313 11/14/2013    No results found.   Assessment and Plan: Samantha Ali is a 68 y.o. year old female presenting with DVT  DVT: Will transition to lovenox. Clinically with xarelto failure (dont have imaging report to confirm new clot burden). Will likely need vascular+/- heme onc follow up. Hypercoaguable panel ordered. This can likely be followed up outpt. Pt is fairly reliable in terms of treatment and follow up. Can likely be d/c'd on lovenox pending specialist follow up.   HTN: clinically stable. Continue home regimen.   Anemia: Unclear if this is baseline. No active signs of bleeding. Check anemia panel.   FEN/GI: Heart healthy diet. PPI Prophylaxis: lovenox  Disposition: pending further evaluation Code Status:Full Code      Shanda Howells MD  Pager: [phone]

## 2013-11-15 NOTE — Progress Notes (Signed)
Pt admitted to 5w17 from ED. Pt lives at home with husband. Pt is A&Ox4. Pt had rt knee surgery in January scar to rt knee for surgery. Pt's rt leg is warm dry and red. Elevated pt's rt leg on two pillows. Pt states that she has no pain. Told pt to call for assistance before getting up. Pt stated understanding. Will continue to monitor pt. Ranelle Oyster, RN

## 2013-11-15 NOTE — Progress Notes (Signed)
[name] discharged Home with husband per MD order.  Discharge instructions reviewed and discussed with the patient, all questions and concerns answered. Copy of instructions and care notes on new medications/diagnosis given to patient.  Pt. Return demonstration on lovenox injection.    Medication List    STOP taking these medications       rivaroxaban 20 MG Tabs tablet  Commonly known as:  XARELTO      TAKE these medications       aspirin EC 81 MG tablet  Take 81 mg by mouth daily.     atorvastatin 10 MG tablet  Commonly known as:  LIPITOR  Take 10 mg by mouth daily after supper.     cloNIDine 0.2 MG tablet  Commonly known as:  CATAPRES  Take 0.2 mg by mouth 2 (two) times daily. Takes 1/2 tablet in the am and 1 tablet in the pm......takes for hot flashes     enoxaparin 120 MG/0.8ML injection  Commonly known as:  LOVENOX  Inject 0.8 mLs (120 mg total) into the skin daily.     fluconazole 150 MG tablet  Commonly known as:  DIFLUCAN  Take 150 mg by mouth daily as needed (One tablet today , then repeat in three days - ongoing Rx.).     HYDROcodone-acetaminophen 5-325 MG per tablet  Commonly known as:  NORCO/VICODIN  Take 1-2 tablets by mouth every 6 (six) hours as needed for moderate pain.     Magnesium 250 MG Tabs  Take 250 mg by mouth 2 (two) times daily.     OMEGA-3-6-9 PO  Take 5 mLs by mouth 2 (two) times daily as needed.     omeprazole 20 MG capsule  Commonly known as:  PRILOSEC  Take 20 mg by mouth 2 (two) times daily before a meal.     OVER THE COUNTER MEDICATION  Take 3 tablets by mouth at bedtime. Hyland's Leg Cramps PM     traMADol 50 MG tablet  Commonly known as:  ULTRAM  Take 50 mg by mouth every 6 (six) hours as needed for moderate pain.     traZODone 50 MG tablet  Commonly known as:  DESYREL  Take 25 mg by mouth at bedtime.     venlafaxine 75 MG tablet  Commonly known as:  EFFEXOR  Take 1 tablet (75 mg total) by mouth daily.     Vitamin D3  1000 UNITS Caps  Take 1 tablet by mouth 2 (two) times daily.        Patients skin is clean, dry and intact, no evidence of skin break down. IV site discontinued by nursing student and catheter remains intact. Site without signs and symptoms of complications. Dressing and pressure applied.  Patient escorted to car by NT in a wheelchair,  no distress noted upon discharge.  Kennadee Walthour C Holly Iannaccone 11/15/2013 3:12 PM

## 2013-11-15 NOTE — Discharge Summary (Signed)
Physician Discharge Summary  Samantha Ali JJO:[id number] DOB: 13-Aug-1945 DOA: 11/14/2013  PCP: Tawanna Solo, MD  Admit date: 11/14/2013 Discharge date: 11/15/2013  Time spent: 60 minutes  Recommendations for Outpatient Follow-up:  1. PCP in 1 weeks.  Coag panel results pending.  Discharge Diagnoses:  Active Problems:   DVT of axillary vein, acute right   DVT (deep venous thrombosis)   Discharge Condition: Stable  Diet recommendation: Heart Healthy  Filed Weights   11/14/13 1929 11/15/13 0152 11/15/13 0511  Weight: 76.658 kg (169 lb) 77.202 kg (170 lb 3.2 oz) 78.835 kg (173 lb 12.8 oz)    History of present illness:  68 y.o. year old female with noted prior hx/o DVT in 1990s, R TKR 07/2013 presenting with DVT recurrence. Pt stated that she developed a DVT 2 weeks after TKR 07/2013. Pt was placed on xarelto for treatment. Pt has been compliant with medication. States that she has had progressive RLE pain and swelling over 2-3 days. No fever or chills. Followed up with PCP about the issue. Had LE u/s done 11/14/13 showing deep vein thrombosis off of an unnamed branch of the right popliteal vein. -Per pt, she was instructed to go to hospital to have IVC filter placed.  Pt denies any CP, SOB, nausea,vomiting, hemiparesis, confusion.  Hemodynamically stable in the ER.    Hospital Course:   DVT:  -Patient developed pain and redness while she is on Xarelto, repeat ultrasound showed DVT. -Patient sent to the hospital "to get an IVC filter", family concern that Xarelto is not working. -I reviewed the duplex from 08/24/2013 which showed DVT in the right popliteal vein. -The no duplex done on 11/14/2013 showed DVT in unnamed branch of the right popliteal vein. -As far as I can see, no clot propagation, no indication even to change that Xarelto and definitely no indication to place an IVC filter. -Transitioned to Lovenox 80 BID. -Hypercoaguable panel ordered to be followed up outpt. Asked  to follow with her oncologist. -D/C to home on lovenox 120 mg once daily.  -Followup with oncology to decide about anticoagulation regimen.  HTN:  Clinically stable. Continue home regimen.   Anemia:  Appears to be at baseline. No active signs of bleeding.  Asymptomatic.    Procedures:  None  Consultations:  None  Discharge Exam: Filed Vitals:   11/15/13 1032  BP: 146/74  Pulse:   Temp:   Resp:     Physical Exam General: Awake, alert, and oriented x3.  NAD HEENT: Normocephalic/Atraumatic, conj. clear, anicteric sclera, PERRLA, Oropharynx free of exudate Neck: Supple, full ROM, no JVD Lungs: Breathing unlabored, CTA b/l anterior/posterior CV: RRR, no m/g/r appreciated Abdomen: Non distended, +BS, non tender Extremities: TKR scar R knee.  R Lower leg atrophic compared to left.  R popliteal area mildly tender.  Medial aspect of lower right leg red and warm >L.  Mildly tender, not painful.  Neg. Homans Skin: No rash or lesions Neuro: No focal deficits      Medication List    ASK your doctor about these medications       aspirin EC 81 MG tablet  Take 81 mg by mouth daily.     atorvastatin 10 MG tablet  Commonly known as:  LIPITOR  Take 10 mg by mouth daily after supper.     cloNIDine 0.2 MG tablet  Commonly known as:  CATAPRES  Take 0.2 mg by mouth 2 (two) times daily. Takes 1/2 tablet in the am and 1 tablet  in the pm......takes for hot flashes     fluconazole 150 MG tablet  Commonly known as:  DIFLUCAN  Take 150 mg by mouth daily as needed (One tablet today , then repeat in three days - ongoing Rx.).     HYDROcodone-acetaminophen 5-325 MG per tablet  Commonly known as:  NORCO/VICODIN  Take 1-2 tablets by mouth every 6 (six) hours as needed for moderate pain.     Magnesium 250 MG Tabs  Take 250 mg by mouth 2 (two) times daily.     OMEGA-3-6-9 PO  Take 5 mLs by mouth 2 (two) times daily as needed.     omeprazole 20 MG capsule  Commonly known as:   PRILOSEC  Take 20 mg by mouth 2 (two) times daily before a meal.     OVER THE COUNTER MEDICATION  Take 3 tablets by mouth at bedtime. Hyland's Leg Cramps PM     rivaroxaban 20 MG Tabs tablet  Commonly known as:  XARELTO  Take 20 mg by mouth daily with breakfast.     traMADol 50 MG tablet  Commonly known as:  ULTRAM  Take 50 mg by mouth every 6 (six) hours as needed for moderate pain.     traZODone 50 MG tablet  Commonly known as:  DESYREL  Take 25 mg by mouth at bedtime.     venlafaxine 75 MG tablet  Commonly known as:  EFFEXOR  Take 1 tablet (75 mg total) by mouth daily.     Vitamin D3 1000 UNITS Caps  Take 1 tablet by mouth 2 (two) times daily.       No Known Allergies Follow-up Information   Follow up with Tawanna Solo, MD In 1 week. [facility] Course and Coag Panel Results.)    Specialty:  Family Medicine   Contact information:   [place] [address] [phone]        The results of significant diagnostics from this hospitalization (including imaging, microbiology, ancillary and laboratory) are listed below for reference.    Significant Diagnostic Studies: 11/14/13 LE Venous Duplex Right: Summary: Findings consistent with deep vein thrombosis off of an unnamed branch of theright popliteal vein.   Labs: Basic Metabolic Panel:  Recent Labs Lab 11/14/13 2349 11/15/13 0630  NA 141 143  K 3.8 3.8  CL 102 103  CO2  --  27  GLUCOSE 92 91  BUN 13 13  CREATININE 1.20* 0.92  CALCIUM  --  9.6   Liver Function Tests:  Recent Labs Lab 11/15/13 0630  AST 24  ALT 22  ALKPHOS 101  BILITOT 0.4  PROT 7.5  ALBUMIN 3.9   CBC:  Recent Labs Lab 11/14/13 2347 11/14/13 2349 11/15/13 0630  WBC 10.2  --  9.1  NEUTROABS  --   --  5.4  HGB 10.9* 11.9* 11.5*  HCT 35.1* 35.0* 36.4  MCV 86.0  --  85.6  PLT 313  --  326     Signed:  Rudean Haskell, PA-S [facility] 11/15/2013, 11:27  AM     Addendum  Patient seen and examined, chart and data base reviewed.  I agree with the above assessment and plan.  For full details please see Mr. Rudean Haskell, PA-S note.   Birdie Hopes, MD [facility] Pager: [phone] 11/15/2013, 2:32 PM

## 2013-11-15 NOTE — Progress Notes (Signed)
ANTICOAGULATION CONSULT NOTE - Initial Consult  Pharmacy Consult for Lovenox Indication: DVT  No Known Allergies  Patient Measurements: Height: 5\' 3"  (160 cm) Weight: 169 lb (76.658 kg) IBW/kg (Calculated) : 52.4  Vital Signs: Temp: 99.3 F (37.4 C) (04/20 2236) Temp src: Oral (04/20 2236) BP: 148/75 mmHg (04/21 0015) Pulse Rate: 86 (04/21 0015)  Labs:  Recent Labs  11/14/13 2347 11/14/13 2349  HGB 10.9* 11.9*  HCT 35.1* 35.0*  PLT 313  --   CREATININE  --  1.20*    Estimated Creatinine Clearance: 44 ml/min (by C-G formula based on Cr of 1.2).   Medical History: Past Medical History  Diagnosis Date  . Cancer   . Breast cancer   . Clot 1996    in the right leg- in the muscle    . GERD (gastroesophageal reflux disease)   . DVT (deep venous thrombosis) 1990's    probably related to positive phlebitis  . Headache(784.0)     migraines- most often 2-3 times /week   . Arthritis     R knee  . Photophobia of both eyes     unknown etiology    Medications:  ASA  Lipitor  Vit D Clonidine  Diflucan  Vicodin  Magnesium Fish Oil  Prilosec  Ultram  Trazodone  Effexor Xarelto 20 mg daily  Assessment: 68 yo female with h/o DVT on Xarelto, now with new DVT, for Lovenox.  Lovenox 75 mg SQ given in ED at 0050  Goal of Therapy:  Full anticoagulation with Lovenox Monitor platelets by anticoagulation protocol: Yes   Plan:  Lovenox 75 mg SQ q12h  Bronson Curb Trey Bebee 11/15/2013,12:56 AM

## 2013-11-15 NOTE — Plan of Care (Signed)
Problem: Phase I Progression Outcomes Goal: Initial discharge plan identified Outcome: Completed/Met Date Met:  11/15/13 To return home with husband

## 2013-11-16 ENCOUNTER — Other Ambulatory Visit: Payer: Self-pay | Admitting: Oncology

## 2013-11-16 ENCOUNTER — Telehealth: Payer: Self-pay | Admitting: *Deleted

## 2013-11-16 DIAGNOSIS — I82409 Acute embolism and thrombosis of unspecified deep veins of unspecified lower extremity: Secondary | ICD-10-CM

## 2013-11-16 LAB — BETA-2-GLYCOPROTEIN I ABS, IGG/M/A
Beta-2 Glyco I IgG: 6 G Units (ref <20)
Beta-2-Glycoprotein I IgA: 4 A Units (ref <20)
Beta-2-Glycoprotein I IgM: 4 M Units (ref <20)

## 2013-11-16 LAB — LUPUS ANTICOAGULANT PANEL
DRVVT: 43.3 s — AB (ref <42.9)
Lupus Anticoagulant: NOT DETECTED
PTT LA: 52.5 s — AB (ref 28.0–43.0)
PTTLA 41 MIX: 42.9 s (ref 28.0–43.0)
dRVVT Incubated 1:1 Mix: 36.9 secs (ref <42.9)

## 2013-11-16 LAB — CARDIOLIPIN ANTIBODIES, IGG, IGM, IGA
ANTICARDIOLIPIN IGG: 4 GPL U/mL — AB (ref <23)
Anticardiolipin IgA: 4 APL U/mL — ABNORMAL LOW (ref <22)
Anticardiolipin IgM: 5 MPL U/mL — ABNORMAL LOW (ref <11)

## 2013-11-16 LAB — PROTEIN S, TOTAL: Protein S Ag, Total: 108 % (ref 60–150)

## 2013-11-16 LAB — PROTEIN C, TOTAL: Protein C, Total: 126 % (ref 72–160)

## 2013-11-16 LAB — PROTEIN S ACTIVITY: Protein S Activity: 96 % (ref 69–129)

## 2013-11-16 LAB — PROTEIN C ACTIVITY: Protein C Activity: 172 % — ABNORMAL HIGH (ref 75–133)

## 2013-11-16 MED ORDER — WARFARIN SODIUM 5 MG PO TABS: 5.0000 mg | ORAL_TABLET | Freq: Every day | ORAL | Status: DC

## 2013-11-16 NOTE — Telephone Encounter (Signed)
Call from hospitalist, Dr Floyce Stakes, that patient needs appt in 1 week for Heme workup for DVT. Due to physician scheduling, unable to see patient for this in appropriate timing. This nurse called Dr Murriel Hopper under direction of Dr Jana Hakim for outpatient consult. All information shared with patient, per Dr Darnell Level, the [facility] scheduler, Samantha Ali will be in touch with patient later today. Patient very appreciative of referral. She understands to call us should she have any issues with her breast cancer. Per Dr Beryle Beams, patient needs to be started on Coumadin 5mg  daily starting on Thursday 4/23 and he will determine if she needs labs next Monday or Tuesday. Informed and instructed patient briefly on Coumadin, dosing, appropriate time to take with dinner and things to avoid regarding Vitamin K.  She will receive more extensive discussion with her visit next week with Dr Beryle Beams.

## 2013-11-20 LAB — PROTHROMBIN GENE MUTATION

## 2013-11-20 LAB — FACTOR 5 LEIDEN

## 2013-11-21 ENCOUNTER — Encounter: Payer: Self-pay | Admitting: Oncology

## 2013-11-21 ENCOUNTER — Other Ambulatory Visit: Payer: Medicare Other

## 2013-11-21 ENCOUNTER — Other Ambulatory Visit: Payer: Self-pay | Admitting: Oncology

## 2013-11-21 ENCOUNTER — Ambulatory Visit (INDEPENDENT_AMBULATORY_CARE_PROVIDER_SITE_OTHER): Payer: Medicare Other | Admitting: Oncology

## 2013-11-21 VITALS — BP 123/76 | HR 96 | Temp 98.1°F | Ht 63.0 in | Wt 171.3 lb

## 2013-11-21 DIAGNOSIS — I82409 Acute embolism and thrombosis of unspecified deep veins of unspecified lower extremity: Secondary | ICD-10-CM

## 2013-11-21 DIAGNOSIS — Z853 Personal history of malignant neoplasm of breast: Secondary | ICD-10-CM

## 2013-11-21 DIAGNOSIS — D6851 Activated protein C resistance: Secondary | ICD-10-CM

## 2013-11-21 DIAGNOSIS — Z7901 Long term (current) use of anticoagulants: Secondary | ICD-10-CM

## 2013-11-21 DIAGNOSIS — D6859 Other primary thrombophilia: Secondary | ICD-10-CM

## 2013-11-21 HISTORY — DX: Activated protein C resistance: D68.51

## 2013-11-21 LAB — POCT INR: INR: 1.5

## 2013-11-21 NOTE — Patient Instructions (Addendum)
You have a mutation in clotting factor #5. (Factor V Leiden) This is a minor risk factor for clotting in most people but can interact with other common risk factors to increase clotting risk. You should avoid estrogen containing contraceptives. You should advise you doctors about this if you need to have any surgery or if you get pregnant.   Stay on coumadin 5 mg daily Come back this Wednesday 4/29 to check level - if still low I will advise you to increase dose to 11/2 pills daily = 7.5 mg then repeat lab after 5 days Repeat lab Monday 5/4 Future lab for June 15  Visit with Dr Beryle Beams 6/21

## 2013-11-21 NOTE — Addendum Note (Signed)
Addended by: Truddie Crumble on: 11/21/2013 04:46 PM   Modules accepted: Orders

## 2013-11-21 NOTE — Progress Notes (Signed)
Patient ID: Samantha Ali, female   DOB: [birth year], 68 y.o.   MRN: [id number] New Patient Hematology-Oncology Evaluation   ANALYSSE BORUCKI [id number] 08-Nov-1945 68 y.o. 11/21/2013  CC: Dr. Kathyrn Lass; Dr. Dereck Leep   Reason for referral: Recurrent DVT on Xarelto   HPI:  New patient evaluation for this pleasant 68 year old breast cancer survivor. She had a right total knee replacement on 08/09/2013. 2 weeks later she developed pain and swelling in her right calf and was found to have a deep venous thrombosis in the popliteal vein. She was treated with Xarelto anticoagulation. She was getting outpatient physical therapy. Last Monday, April 20, she complained of increasing pain and was referred for a followup Doppler studies which showed extension of new clot down a branch of the popliteal vein. I discussed these findings with the technician who did the study and in fact this appeared to be a new event. She was started on Lovenox injections. I was apprised of the situation and asked to evaluate her further. In anticipation of today's visit I got her started on Coumadin 5 mg daily beginning on the evening of April 22.  She took estrogen for many years between 1966 and the time of her diagnosis of breast cancer in 2004. She has no signs or symptoms of a collagen vascular disorder. She's had a number of other surgeries without any thrombotic complications. She describes an episode of superficial phlebitis also affecting the right leg and involving a varicose vein back in 1996 treated conservatively with elevation of the leg. She is a never smoker. There is a positive family history of clotting in her mother who sustained a postpartum deep venous thrombosis in her 29s and had a second DVT and questionable pulmonary embolus in her 53s. She is still alive and well at age 25. Father died of unrelated reasons at age greater than 81. She is an only child. She has one son aged 79 who had an episode of  what may have been amaurosis fugax versus Bell's palsy. He was not put on any blood thinners.  Hypercoagulation profile done in anticipation of today's visit reveals that she is a heterozygote for the factor V Leiden gene mutation. The prothrombin gene mutation was not detected. Antithrombin level normal at 115% of control. Lupus anticoagulant not detected. No antibodies against anticardiolipin or beta-2 glycoprotein 1. Protein S and protein C activities normal.   PMH: Past Medical History  Diagnosis Date  . Cancer   . Breast cancer   . Clot 1996    in the right leg- in the muscle    . GERD (gastroesophageal reflux disease)   . DVT (deep venous thrombosis) 1990's    probably related to positive phlebitis  . Headache(784.0)     migraines- most often 2-3 times /week   . Arthritis     R knee  . Photophobia of both eyes     unknown etiology  . Factor 5 Leiden mutation, heterozygous 11/21/2013  She denies hypertension, MI, thyroid disease, ulcers, hepatitis, or yellow jaundice. She had mononucleosis when she was a teenager. No renal disease. No history of seizure or stroke. No inflammatory arthritis.  Breast cancer diagnosed 02/15/2003;  well-differentiated infiltrating ductal carcinoma with lobular features stage TI c N0, estrogen receptor positive, status post lumpectomy 02/21/2003 with sentinel lymph node dissection in 2004 then breast radiation followed by 5 years of Arimidex then 5 years of letrozole versus placebo on a second study that has not been unblinded yet.  She completed all planned treatment in October 2014.  Past Surgical History  Procedure Laterality Date  . Knee surgery    . Shoulder surgery    . Tubal ligation    . Sentinel lymph node biopsy  02/21/2003  . Breast surgery  1987    right lumpectomy for benign disease  . Breast lumpectomy  02/21/2003  . Mastectomy Left 2004    L parial mastectomy  . Tonsillectomy    . Eye surgery      cataracts removed -IOL  .  Total knee arthroplasty Right 08/09/2013    Procedure: TOTAL KNEE ARTHROPLASTY;  Surgeon: Hessie Dibble, MD;  Location: [place];  Service: Orthopedics;  Laterality: Right;    Allergies: No Known Allergies  Medications: Current outpatient prescriptions:aspirin EC 81 MG tablet, Take 81 mg by mouth daily., Disp: , Rfl: ;  atorvastatin (LIPITOR) 10 MG tablet, Take 10 mg by mouth daily after supper. , Disp: , Rfl: ;  Cholecalciferol (VITAMIN D3) 1000 UNITS CAPS, Take 1 tablet by mouth 2 (two) times daily. , Disp: , Rfl:  cloNIDine (CATAPRES) 0.2 MG tablet, Take 0.2 mg by mouth 2 (two) times daily. Takes 1/2 tablet in the am and 1 tablet in the pm......takes for hot flashes, Disp: , Rfl: ;  enoxaparin (LOVENOX) 120 MG/0.8ML injection, Inject 0.8 mLs (120 mg total) into the skin daily., Disp: 30 Syringe, Rfl: 0;  fluconazole (DIFLUCAN) 150 MG tablet, Take 150 mg by mouth daily as needed (One tablet today , then repeat in three days - ongoing Rx.). , Disp: , Rfl:  HYDROcodone-acetaminophen (NORCO/VICODIN) 5-325 MG per tablet, Take 1-2 tablets by mouth every 6 (six) hours as needed for moderate pain., Disp: 70 tablet, Rfl: 0;  Magnesium 250 MG TABS, Take 250 mg by mouth 2 (two) times daily., Disp: , Rfl: ;  Omega 3-6-9 Fatty Acids (OMEGA-3-6-9 PO), Take 5 mLs by mouth 2 (two) times daily as needed. , Disp: , Rfl:  omeprazole (PRILOSEC) 20 MG capsule, Take 20 mg by mouth 2 (two) times daily before a meal. , Disp: , Rfl: ;  OVER THE COUNTER MEDICATION, Take 3 tablets by mouth at bedtime. Hyland's Leg Cramps PM, Disp: , Rfl: ;  traMADol (ULTRAM) 50 MG tablet, Take 50 mg by mouth every 6 (six) hours as needed for moderate pain. , Disp: , Rfl: ;  traZODone (DESYREL) 50 MG tablet, Take 25 mg by mouth at bedtime. , Disp: , Rfl:  venlafaxine (EFFEXOR) 75 MG tablet, Take 1 tablet (75 mg total) by mouth daily., Disp: 90 tablet, Rfl: 3;  warfarin (COUMADIN) 5 MG tablet, Take 1 tablet (5 mg total) by mouth daily., Disp: 30  tablet, Rfl: 1   Social History:  she has never smoked.  she does not drink alcohol   Family History: Family History  Problem Relation Age of Onset  . Heart disease Father     Review of Systems: Hematology: negative for swollen glands, easy bruising, epistaxis, gum bleeding, hematuria, hematochezia ENT ROS: negative for - oral lesions or sore throat Breast ROS: negative for - new or changing breast lumps Respiratory ROS: negative for - cough, pleuritic pain, shortness of breath or wheezing. No hemoptysis. Cardiovascular ROS: negative for - chest pain, dyspnea on exertion, edema, irregular heartbeat, murmur,  palpitations, rapid heart rate Gastrointestinal ROS: negative for - abdominal pain, appetite loss, blood in stools, change in bowel habits, constipation, diarrhea, heartburn, hematemesis, melena, nausea/vomiting or swallowing difficulty/pain Genito-Urinary ROS:  Musculoskeletal ROS: negative for -  joint pain, joint stiffness, joint swelling, muscle pain, muscular weakness or pain  Neurological ROS: negative for -   headaches, impaired coordination/balance, , numbness/tingling,  Dermatological ROS: negative for rash, ecchymosis Remaining ROS negative.  Physical Exam: Blood pressure 123/76, pulse 96, temperature 98.1 F (36.7 C), temperature source Oral, height 5\' 3"  (1.6 m), weight 171 lb 4.8 oz (77.701 kg), SpO2 97.00%. Wt Readings from Last 3 Encounters:  11/21/13 171 lb 4.8 oz (77.701 kg)  11/15/13 173 lb 12.8 oz (78.835 kg)  08/11/13 171 lb 1.2 oz (77.6 kg)     General appearance: Well-nourished Caucasian woman HENNT: Pharynx no erythema, exudate, mass, or ulcer. No thyromegaly or thyroid nodules Lymph nodes: No cervical, supraclavicular, or axillary lymphadenopathy Breasts:  Lungs: Clear to auscultation, resonant to percussion throughout Heart: Regular rhythm, no murmur, no gallop, no rub, no click, no edema Abdomen: Soft, nontender, normal bowel sounds, no mass, no  organomegaly Extremities: No edema, residual point calf tenderness about 10 cm below the knee medially and posteriorly Right calf 38 cm left 38 cm, right ankle 21.5 cm left 22 cm. Musculoskeletal: Well-healed scar over recent right knee joint replacement. GU:  Vascular: Carotid pulses 2+, no bruits, distal pulses: Dorsalis pedis 1+ right foot not palpable left foot posterior tibial pulses are 1+ symmetric Neurologic: Alert, oriented, PERRLA, optic discs sharp and vessels normal, no hemorrhage or exudate, cranial nerves grossly normal, motor strength 5 over 5, reflexes 1+ symmetric, upper body coordination normal, gait normal, Skin: No rash or ecchymosis    Lab Results: Lab Results  Component Value Date   WBC 9.1 11/15/2013   HGB 11.5* 11/15/2013   HCT 36.4 11/15/2013   MCV 85.6 11/15/2013   PLT 326 11/15/2013     Chemistry      Component Value Date/Time   NA 143 11/15/2013 0630   NA 143 05/30/2013 1040   K 3.8 11/15/2013 0630   K 4.6 05/30/2013 1040   CL 103 11/15/2013 0630   CL 105 05/05/2012 1058   CO2 27 11/15/2013 0630   CO2 27 05/30/2013 1040   BUN 13 11/15/2013 0630   BUN 14.4 05/30/2013 1040   CREATININE 0.92 11/15/2013 0630   CREATININE 1.1 05/30/2013 1040      Component Value Date/Time   CALCIUM 9.6 11/15/2013 0630   CALCIUM 10.1 05/30/2013 1040   ALKPHOS 101 11/15/2013 0630   ALKPHOS 88 05/30/2013 1040   AST 24 11/15/2013 0630   AST 26 05/30/2013 1040   ALT 22 11/15/2013 0630   ALT 22 05/30/2013 1040   BILITOT 0.4 11/15/2013 0630   BILITOT 0.59 05/30/2013 1040     Radiological Studies: See discussion above     Impression and Plan: Surgically provoked popliteal deep venous thrombosis with extension of clot along a branch of the popliteal vein while on therapeutic Xarelto. She is now found to be a heterozygote for the factor V Leiden gene mutation.  Given the fact that she was on estrogen for many years, had an uneventful pregnancy, was then on 10 years of aromatase inhibitor  therapy for early stage breast cancer, speaks to the fact that this genetic risk factor has not been very dominant or active until the recent surgery on her right knee. Therefore, I do not feel that we need to commit her to long-term anticoagulation. It is unfortunate that she extended her clot close to the time where she would have completed 3 months of therapeutic anticoagulation. I have started her on Coumadin which remains  the gold standard and will plan on an additional 3 months of treatment.       Annia Belt, MD 11/21/2013, 3:44 PM

## 2013-11-23 ENCOUNTER — Other Ambulatory Visit (INDEPENDENT_AMBULATORY_CARE_PROVIDER_SITE_OTHER): Payer: Medicare Other

## 2013-11-23 DIAGNOSIS — Z7901 Long term (current) use of anticoagulants: Secondary | ICD-10-CM

## 2013-11-23 DIAGNOSIS — I82409 Acute embolism and thrombosis of unspecified deep veins of unspecified lower extremity: Secondary | ICD-10-CM

## 2013-11-23 LAB — PROTIME-INR
INR: 1.41 (ref <1.50)
PROTHROMBIN TIME: 16.9 s — AB (ref 11.6–15.2)

## 2013-11-23 LAB — D-DIMER, QUANTITATIVE: D-Dimer, Quant: 0.95 ug/mL-FEU — ABNORMAL HIGH (ref 0.00–0.48)

## 2013-11-24 ENCOUNTER — Telehealth: Payer: Self-pay | Admitting: *Deleted

## 2013-11-24 NOTE — Telephone Encounter (Signed)
Message copied by Ebbie Latus on Thu Nov 24, 2013  9:27 AM ------      Message from: Annia Belt      Created: Wed Nov 23, 2013  2:57 PM       Call pt: coumadin level still low: increase to 7.5 mg = 1 1/2 pills daily as we discussed. Stay on lovenox injections. Repeat lab Monday 5/4 ------

## 2013-11-24 NOTE — Telephone Encounter (Signed)
Called pt - pt informed Coumadin level still low ; increase her Coumadin to 1 1/2 tab=7.5mg  per Dr Beryle Beams and to stay on lovenox injections and lab on Monday5/4/15. Pt voiced understanding/repeated instructions.

## 2013-11-25 ENCOUNTER — Encounter: Payer: Self-pay | Admitting: *Deleted

## 2013-11-28 ENCOUNTER — Other Ambulatory Visit (INDEPENDENT_AMBULATORY_CARE_PROVIDER_SITE_OTHER): Payer: Medicare Other

## 2013-11-28 ENCOUNTER — Other Ambulatory Visit: Payer: Self-pay | Admitting: Oncology

## 2013-11-28 ENCOUNTER — Telehealth: Payer: Self-pay | Admitting: *Deleted

## 2013-11-28 DIAGNOSIS — I82409 Acute embolism and thrombosis of unspecified deep veins of unspecified lower extremity: Secondary | ICD-10-CM

## 2013-11-28 LAB — PROTIME-INR
INR: 1.9 — ABNORMAL HIGH (ref <1.50)
Prothrombin Time: 21.3 seconds — ABNORMAL HIGH (ref 11.6–15.2)

## 2013-11-28 NOTE — Telephone Encounter (Signed)
Pt notified that she is "getting close on the Coumadin", per Dr. Beryle Beams,  and needs to stay on her Lovenox shots for 3 more days, and stay on the Coumadin 7.5 mg ( 1 1/2 pills daily) and return to the [facility] lab on Thursday AM, May 7 for another lab test. Dr. Beryle Beams hopes that she will be able to stop the Lovenox by then. Pt was able to teach back all the above instructions re: continuing Lovenox, Coumadin as stated, and return to [facility] lab on 12/01/13, at which time Dr. Beryle Beams will review the lab results to see if she can stop the Lovenox. Yvonna Alanis, RN, 11/28/13, 7:14 PM

## 2013-11-29 ENCOUNTER — Other Ambulatory Visit: Payer: BC Managed Care – PPO

## 2013-11-29 ENCOUNTER — Encounter: Payer: BC Managed Care – PPO | Admitting: Oncology

## 2013-12-01 ENCOUNTER — Other Ambulatory Visit: Payer: Self-pay | Admitting: Oncology

## 2013-12-01 ENCOUNTER — Encounter: Payer: Self-pay | Admitting: Oncology

## 2013-12-01 ENCOUNTER — Other Ambulatory Visit (INDEPENDENT_AMBULATORY_CARE_PROVIDER_SITE_OTHER): Payer: Medicare Other

## 2013-12-01 DIAGNOSIS — I82409 Acute embolism and thrombosis of unspecified deep veins of unspecified lower extremity: Secondary | ICD-10-CM

## 2013-12-01 LAB — PROTIME-INR
INR: 1.86 — AB (ref <1.50)
Prothrombin Time: 20.9 seconds — ABNORMAL HIGH (ref 11.6–15.2)

## 2013-12-01 LAB — CBC
HEMATOCRIT: 35.8 % — AB (ref 36.0–46.0)
HEMOGLOBIN: 11.2 g/dL — AB (ref 12.0–15.0)
MCH: 26.7 pg (ref 26.0–34.0)
MCHC: 31.3 g/dL (ref 30.0–36.0)
MCV: 85.2 fL (ref 78.0–100.0)
Platelets: 351 10*3/uL (ref 150–400)
RBC: 4.2 MIL/uL (ref 3.87–5.11)
RDW: 15.7 % — ABNORMAL HIGH (ref 11.5–15.5)
WBC: 7.1 10*3/uL (ref 4.0–10.5)

## 2013-12-01 NOTE — Progress Notes (Signed)
Patient ID: Samantha Ali, female   DOB: 06-24-1946, 68 y.o.   MRN: [id number] Pt here for coumadin check.  INR subtherapeutic on 5 mg daily; increased to 7.5 mg - INR 1.9 after 6 doses at 7.5. She developed some vaginal bleeding today. More than just spotting. Hb is stable at 11.2. I will go ahead and stop her lovenox and make another dose adjustment on her coumadin: take 10 mg Tuesdays and Thursdays, 7.5 mg other days of the week. Repeat PT/INR in 1 week.  She is advised to get an appt with her Gyn Dr Ulanda Edison.

## 2013-12-02 ENCOUNTER — Telehealth: Payer: Self-pay | Admitting: *Deleted

## 2013-12-02 NOTE — Telephone Encounter (Signed)
Followed up w/ pt - stated urine is clearing; not as red; she has been drinking plenty of fluids.

## 2013-12-02 NOTE — Telephone Encounter (Signed)
Patient calling in to this office at [facility] with complaints of bright red blood with each urination this morning. Patient is following Dr Beryle Beams [facility]. She had labs done yesterday and has now been discontinued from Lovenox and converted to Warfarin, according to patient. She is very anxious, has tried to call [facility] and can not get an answer. This nurse has attempted as well to call [facility] to help expedite next plan of care for patient complaints of bleeding. Instructed patient to continue hydrating well. CBC and PT/INR noted from 12/01/13, within normal limits. Will page Dr Beryle Beams to alert him for patient awaiting a call.

## 2013-12-02 NOTE — Telephone Encounter (Signed)
Call from pt - pt states she is voiding bright red blood not vaginally as stated yesterday; saw 1 small clot earlier this morning.Besdie frequent urination, no symptoms of UTI i.e. burning, odor. States she has has been drinking fluids as suggested by Amy at the [place]. Talked to Dr Beryle Beams - instructed pt to stop Coumadin over the weekend and re-check levels Monday. Also if bleeding becomes worse, pt instructed  to go the ED - pt voiced understanding.

## 2013-12-02 NOTE — Telephone Encounter (Signed)
Pt called back; wanted to know if she should take ASA 81mg . Dr Beryle Beams stated it should be ok.; pt called/informed.

## 2013-12-02 NOTE — Telephone Encounter (Signed)
g

## 2013-12-05 ENCOUNTER — Other Ambulatory Visit: Payer: Self-pay | Admitting: Oncology

## 2013-12-05 ENCOUNTER — Encounter: Payer: Self-pay | Admitting: Oncology

## 2013-12-05 ENCOUNTER — Other Ambulatory Visit (INDEPENDENT_AMBULATORY_CARE_PROVIDER_SITE_OTHER): Payer: Medicare Other

## 2013-12-05 DIAGNOSIS — R310 Gross hematuria: Secondary | ICD-10-CM

## 2013-12-05 DIAGNOSIS — I82409 Acute embolism and thrombosis of unspecified deep veins of unspecified lower extremity: Secondary | ICD-10-CM

## 2013-12-05 LAB — POCT INR: INR: 1.1

## 2013-12-05 NOTE — Progress Notes (Signed)
Patient ID: Samantha Ali, female   DOB: 1946-02-20, 68 y.o.   MRN: [id number] Unofficial visit for this 68 year old woman I recently saw for office consultation on further management of extension of a lower extremity DVT while on therapeutic Xarelto. Please see my 11/21/2013 office consultation. She was on therapeutic Lovenox and started on Coumadin. I was titrating the dose. She got up to 7.5 mg daily but was still subtherapeutic with INR of 1.9 on May 4. Despite this, she complained of sudden onset of vaginal bleeding, not heavy but more than just spotting. I stopped the Lovenox. I had her come back to repeat labs on Thursday, May 7. INR still subtherapeutic at 1.86. She now complained of sudden onset of gross painless hematuria. I advised her to stop the Coumadin over the weekend. Not surprisingly, INR today is subtherapeutic at 1.1. The hematuria has slowly resolved. No more vaginal bleeding.  Impression: Gross hematuria in a lady who needs ongoing anticoagulation for extension of DVT in the right popliteal vein branches. I am concerned that we may be dealing with underlying bladder pathology and I am reluctant to put her back on Coumadin without urologic evaluation. I have arranged for a visit with the urology group this Friday. I told her to go back on Lovenox. Since it is late in the day, I told her to take half of a dose today and then resume tomorrow morning with the full 1.5 mg per kilogram daily dose pending further urologic evaluation. Husband present. Recommendations reviewed.

## 2013-12-06 ENCOUNTER — Telehealth: Payer: Self-pay | Admitting: *Deleted

## 2013-12-06 NOTE — Telephone Encounter (Signed)
Call from pt - stating she needs a letter faxed to [place] to hold  physical therapy until she has been seen by the Urologist. Fax # [phone].

## 2013-12-07 ENCOUNTER — Telehealth: Payer: Self-pay | Admitting: *Deleted

## 2013-12-07 NOTE — Telephone Encounter (Signed)
Message copied by Ebbie Latus on Wed Dec 07, 2013  1:49 PM ------      Message from: Annia Belt      Created: Wed Dec 07, 2013 10:32 AM      Regarding: hold lovenox       Holley Raring - can you call her and have her hold her lovenox dose on Thursday in case they do a cystoscopy on Friday when she sees Urology      Thanks      G ------

## 2013-12-07 NOTE — Telephone Encounter (Signed)
Called pt - pt informed to hold Lovenox on Thursday in case urology does cystoscopy on Friday per Dr Beryle Beams. Pt voiced understanding.

## 2013-12-08 ENCOUNTER — Other Ambulatory Visit: Payer: Medicare Other

## 2013-12-09 ENCOUNTER — Other Ambulatory Visit: Payer: Medicare Other

## 2013-12-09 ENCOUNTER — Other Ambulatory Visit: Payer: Self-pay | Admitting: Oncology

## 2013-12-09 ENCOUNTER — Telehealth: Payer: Self-pay | Admitting: *Deleted

## 2013-12-09 DIAGNOSIS — I82409 Acute embolism and thrombosis of unspecified deep veins of unspecified lower extremity: Secondary | ICD-10-CM

## 2013-12-09 NOTE — Telephone Encounter (Signed)
Talked to Dr Beryle Beams - pt needs to increase Coumadin to 7.5mg , continue Lovenox, and check PT/INR Wed 5/20. Pt was called/informed - voiced understanding.

## 2013-12-09 NOTE — Telephone Encounter (Signed)
Call from pt - Stated she had her appt today w/Dr MacDiarmid.Marland Kitchen He checked her urine; no blood and she has a CT scheduled Thursday of next week. She started Lovenox back after her visit. She wants to know should she continue Lovenox? And when should she start back physical therapy?

## 2013-12-12 ENCOUNTER — Telehealth: Payer: Self-pay | Admitting: *Deleted

## 2013-12-12 NOTE — Telephone Encounter (Signed)
Pt called/informed she can resumed full PT activity per Dr Beryle Beams.

## 2013-12-12 NOTE — Telephone Encounter (Signed)
She can resume full PT activity

## 2013-12-12 NOTE — Telephone Encounter (Signed)
Returned pt's call - pt wants to know if she can continue physical and at what level of activity? Thanks

## 2013-12-14 ENCOUNTER — Other Ambulatory Visit (INDEPENDENT_AMBULATORY_CARE_PROVIDER_SITE_OTHER): Payer: Medicare Other

## 2013-12-14 ENCOUNTER — Other Ambulatory Visit: Payer: Self-pay | Admitting: *Deleted

## 2013-12-14 DIAGNOSIS — I82409 Acute embolism and thrombosis of unspecified deep veins of unspecified lower extremity: Secondary | ICD-10-CM

## 2013-12-14 LAB — CBC WITH DIFFERENTIAL/PLATELET
Basophils Absolute: 0.1 10*3/uL (ref 0.0–0.1)
Basophils Relative: 1 % (ref 0–1)
EOS ABS: 0.2 10*3/uL (ref 0.0–0.7)
EOS PCT: 3 % (ref 0–5)
HCT: 34.2 % — ABNORMAL LOW (ref 36.0–46.0)
HEMOGLOBIN: 10.8 g/dL — AB (ref 12.0–15.0)
LYMPHS PCT: 33 % (ref 12–46)
Lymphs Abs: 1.9 10*3/uL (ref 0.7–4.0)
MCH: 27 pg (ref 26.0–34.0)
MCHC: 31.6 g/dL (ref 30.0–36.0)
MCV: 85.5 fL (ref 78.0–100.0)
MONOS PCT: 8 % (ref 3–12)
Monocytes Absolute: 0.5 10*3/uL (ref 0.1–1.0)
Neutro Abs: 3.1 10*3/uL (ref 1.7–7.7)
Neutrophils Relative %: 55 % (ref 43–77)
PLATELETS: 367 10*3/uL (ref 150–400)
RBC: 4 MIL/uL (ref 3.87–5.11)
RDW: 16.4 % — ABNORMAL HIGH (ref 11.5–15.5)
WBC: 5.7 10*3/uL (ref 4.0–10.5)

## 2013-12-14 LAB — PROTIME-INR
INR: 1.3 (ref <1.50)
PROTHROMBIN TIME: 15.9 s — AB (ref 11.6–15.2)

## 2013-12-14 MED ORDER — WARFARIN SODIUM 5 MG PO TABS: 5.0000 mg | ORAL_TABLET | Freq: Every day | ORAL | Status: DC

## 2013-12-14 MED ORDER — ENOXAPARIN SODIUM 120 MG/0.8ML ~~LOC~~ SOLN: 120.0000 mg | SUBCUTANEOUS | Status: DC

## 2013-12-14 NOTE — Telephone Encounter (Signed)
Pt presents to office for lab work today, she also has 2 requests, 1) needs refills on coumadin and lovenox 2) she is having a CT tomorrow 5/21 at 1000 for dr Matilde Sprang and needs to know if she should skip her coumadin tonight. She has only 1 dose left of coumadin so she needs her refills today Please advise

## 2013-12-14 NOTE — Telephone Encounter (Signed)
She should not stop her coumadin for a CT scan!!! 

## 2013-12-14 NOTE — Telephone Encounter (Signed)
She should not stop her coumadin for a CT scan!!!

## 2013-12-14 NOTE — Telephone Encounter (Signed)
Called pt, instructed her not to stop coumadin for ct, she repeated back the instructions, called lovenox to pharmacy, they had not rec'd the coumadin electronically so i gave it to them verbally

## 2013-12-14 NOTE — Telephone Encounter (Signed)
Pt presented concerned about 2 items, med refills and a CT she is to have done at 1000 on Thursday 12/15/2013. She wants to know whether to stop her coumadin for tonight due to the ct tomorrow. Also if she continues her coumadin she only has enough for tonight's dose. Please advise.

## 2013-12-15 ENCOUNTER — Telehealth: Payer: Self-pay | Admitting: *Deleted

## 2013-12-15 NOTE — Telephone Encounter (Signed)
Message copied by Ebbie Latus on Thu Dec 15, 2013 12:08 PM ------      Message from: Annia Belt      Created: Wed Dec 14, 2013  4:48 PM       Please find out how many doses of coumadin she has taken.  She should be on 7.5 mg daily.  If she has taken at least 4 doses, I want her to change to 10 mg = 2x 5 mg tablets on Tuesdays and Thursdays, and Saturdays;  7.5 mg = 1.5 tabs other days of week.  Will need to stay on lovenox.  Put in lab for PT/INR for next Tuesday, 5/26 ------

## 2013-12-15 NOTE — Telephone Encounter (Signed)
Called pt - stated she had just completed CT scan. At the drugstore, stated Lovenox needs prior authorization -given to [facility]. Said she started Coumadin 7.5mg  on the day instructed which is Samantha Ali she has been on this dose x 6 days. Pt instructed to take 10 mg on Tues, Thurs, and Sat; 7.5 mg on the other days. Also to continue Lovenox - per Dr Beryle Beams. Pt was able repeat back instructions; voiced understanding. Lab appt scheduled for Tuesday 5/26.

## 2013-12-16 ENCOUNTER — Telehealth: Payer: Self-pay | Admitting: *Deleted

## 2013-12-16 NOTE — Telephone Encounter (Signed)
Call to [facility] for Prior Authorization for Enoxaparin 120 mg.  Forms to be faxed to Dr. Beryle Beams for completion and fax back for review.  Sander Nephew, RN 12/16/2013 10:59 AM

## 2013-12-20 ENCOUNTER — Other Ambulatory Visit: Payer: Self-pay | Admitting: Oncology

## 2013-12-20 ENCOUNTER — Other Ambulatory Visit (INDEPENDENT_AMBULATORY_CARE_PROVIDER_SITE_OTHER): Payer: Medicare Other

## 2013-12-20 DIAGNOSIS — I82409 Acute embolism and thrombosis of unspecified deep veins of unspecified lower extremity: Secondary | ICD-10-CM

## 2013-12-20 LAB — PROTIME-INR
INR: 1.74 — AB (ref <1.50)
PROTHROMBIN TIME: 19.8 s — AB (ref 11.6–15.2)

## 2013-12-21 ENCOUNTER — Telehealth: Payer: Self-pay | Admitting: *Deleted

## 2013-12-21 NOTE — Telephone Encounter (Signed)
Called pt - Pt informed PT/INR not therapeutic-stay on Lovenox per Dr Beryle Beams. Reminded pt to take Coumadin 10mg  on Tues/Thurs/Sat and 7.5mg  on the other days; instructed pt to write instructions down - stated she had it written from before but had forgotten to increase the dosage. Also to re-check PT on Friday 5/29 - lab appt scheduled. Repeated instructions again and voiced understanding.

## 2013-12-21 NOTE — Telephone Encounter (Signed)
Message copied by Ebbie Latus on Wed Dec 21, 2013  9:29 AM ------      Message from: Annia Belt      Created: Tue Dec 20, 2013  7:18 PM       Call pt: coumadin level not therapeutic yet.  Stay on lovenox; She ran in to me in the hospital today - she never increased dose - please have her take 10 mg Tuesdays , Thursdays,  And Saturdays, 7.5 mg other days of the week. Check PT again this Friday AM 5/29 ------

## 2013-12-22 NOTE — Progress Notes (Signed)
Per telephone conversation with patient, she has not experienced any additional adverse events beyond the previously reported grade 3 joint pain, resulting in total knee replacement, and associated venous thrombosis events post-operatively. Documentation of hospitalizations, DVT events, and assessment of attribution by PI Murriel Hopper MD were previously obtained. She has experienced no bone fractures. She continues on her previous lipid therapy and is not taking any bisphosphonates. Cindy S. Brigitte Pulse BSN, RN, [employer]

## 2013-12-23 ENCOUNTER — Other Ambulatory Visit: Payer: Self-pay | Admitting: Oncology

## 2013-12-23 ENCOUNTER — Other Ambulatory Visit (INDEPENDENT_AMBULATORY_CARE_PROVIDER_SITE_OTHER): Payer: Medicare Other

## 2013-12-23 DIAGNOSIS — I82409 Acute embolism and thrombosis of unspecified deep veins of unspecified lower extremity: Secondary | ICD-10-CM

## 2013-12-23 LAB — PROTIME-INR
INR: 2.26 — ABNORMAL HIGH (ref <1.50)
Prothrombin Time: 24.2 seconds — ABNORMAL HIGH (ref 11.6–15.2)

## 2013-12-26 ENCOUNTER — Telehealth: Payer: Self-pay | Admitting: *Deleted

## 2013-12-26 NOTE — Telephone Encounter (Signed)
Appt has been scheduled on Friday 12/30/13 @ 1315PM.

## 2013-12-26 NOTE — Telephone Encounter (Signed)
Message copied by Ebbie Latus on Mon Dec 26, 2013  9:31 AM ------      Message from: Annia Belt      Created: Fri Dec 23, 2013  5:23 PM       Holley Raring - I called the patient and advised her to stop the lovenox; continue coumadin.      I put in lab for Friday, 6/4 - please put on schedule      Thanks Dr Darnell Level ------

## 2013-12-26 NOTE — Telephone Encounter (Signed)
Message copied by Ebbie Latus on Mon Dec 26, 2013  9:32 AM ------      Message from: Samantha Ali      Created: Fri Dec 23, 2013  5:23 PM       Holley Raring - I called the patient and advised her to stop the lovenox; continue coumadin.      I put in lab for Friday, 6/4 - please put on schedule      Thanks Dr Darnell Level ------

## 2013-12-30 ENCOUNTER — Other Ambulatory Visit: Payer: Self-pay | Admitting: *Deleted

## 2013-12-30 ENCOUNTER — Other Ambulatory Visit: Payer: Self-pay | Admitting: Oncology

## 2013-12-30 ENCOUNTER — Other Ambulatory Visit (INDEPENDENT_AMBULATORY_CARE_PROVIDER_SITE_OTHER): Payer: Medicare Other

## 2013-12-30 ENCOUNTER — Telehealth: Payer: Self-pay | Admitting: *Deleted

## 2013-12-30 DIAGNOSIS — I82409 Acute embolism and thrombosis of unspecified deep veins of unspecified lower extremity: Secondary | ICD-10-CM

## 2013-12-30 DIAGNOSIS — D500 Iron deficiency anemia secondary to blood loss (chronic): Secondary | ICD-10-CM

## 2013-12-30 LAB — CBC WITH DIFFERENTIAL/PLATELET
Basophils Absolute: 0.1 10*3/uL (ref 0.0–0.1)
Basophils Relative: 1 % (ref 0–1)
Eosinophils Absolute: 0.2 10*3/uL (ref 0.0–0.7)
Eosinophils Relative: 3 % (ref 0–5)
HCT: 29.8 % — ABNORMAL LOW (ref 36.0–46.0)
Hemoglobin: 9.2 g/dL — ABNORMAL LOW (ref 12.0–15.0)
LYMPHS PCT: 29 % (ref 12–46)
Lymphs Abs: 2 10*3/uL (ref 0.7–4.0)
MCH: 25.9 pg — ABNORMAL LOW (ref 26.0–34.0)
MCHC: 30.9 g/dL (ref 30.0–36.0)
MCV: 83.9 fL (ref 78.0–100.0)
Monocytes Absolute: 0.7 10*3/uL (ref 0.1–1.0)
Monocytes Relative: 10 % (ref 3–12)
NEUTROS ABS: 4 10*3/uL (ref 1.7–7.7)
Neutrophils Relative %: 57 % (ref 43–77)
PLATELETS: 350 10*3/uL (ref 150–400)
RBC: 3.55 MIL/uL — AB (ref 3.87–5.11)
RDW: 16.3 % — ABNORMAL HIGH (ref 11.5–15.5)
WBC: 7 10*3/uL (ref 4.0–10.5)

## 2013-12-30 LAB — PROTIME-INR
INR: 2.8 — AB (ref <1.50)
Prothrombin Time: 28.5 seconds — ABNORMAL HIGH (ref 11.6–15.2)

## 2013-12-30 NOTE — Telephone Encounter (Signed)
Message copied by Ebbie Latus on Fri Dec 30, 2013  4:03 PM ------      Message from: Annia Belt      Created: Fri Dec 30, 2013  3:34 PM       Please call our friend:  Coumadin in good therapeutic range.  I want to check again in 2 weeks - we may be able to decrease the dose.      Have her start an iron supplement - iron sulfate 325 mg TID  OTC      I will put in lab for  6/22 ------

## 2013-12-30 NOTE — Telephone Encounter (Signed)
Called pt - pt informed Coumadin is in good therapeutic range, INR of 2.8; check level in 2 weeks - 6/22; might be able to decrease her dosage per Dr Beryle Beams. Also she needs to start taking Iron Sulfate 325mg  TID OTC per Dr Beryle Beams - informed her Hgb is 9.2. Pt voiced understanding; repeated instructions.

## 2014-01-09 ENCOUNTER — Other Ambulatory Visit: Payer: Medicare Other

## 2014-01-09 ENCOUNTER — Telehealth: Payer: Self-pay | Admitting: *Deleted

## 2014-01-09 NOTE — Telephone Encounter (Addendum)
Received faxed PA request from pt's pharmacy for her Enoxaparin 120 mg/0.8 ml inject one syringe once daily as directed qty 24 ml.  I contacted pt to see if she was currently taking med at this time, as it seems medication was stopped for now because of bleeding issues.  Pt states it was on hold for now and she is to see Dr.Granfortuna on 06/22 and it was going to be decided at that visit it medication should be resumed.  PA request placed on hold at this time.Despina Hidden Cassady6/15/201512:53 PM     Pt ID# [id number] (insurance limits to 37ml every 67 days) [phone]

## 2014-01-16 ENCOUNTER — Encounter: Payer: Self-pay | Admitting: Oncology

## 2014-01-16 ENCOUNTER — Ambulatory Visit (INDEPENDENT_AMBULATORY_CARE_PROVIDER_SITE_OTHER): Payer: Medicare Other | Admitting: Oncology

## 2014-01-16 ENCOUNTER — Other Ambulatory Visit: Payer: Self-pay | Admitting: *Deleted

## 2014-01-16 ENCOUNTER — Other Ambulatory Visit (INDEPENDENT_AMBULATORY_CARE_PROVIDER_SITE_OTHER): Payer: Medicare Other

## 2014-01-16 VITALS — BP 125/73 | HR 88 | Temp 99.3°F | Resp 20 | Ht 63.0 in | Wt 173.3 lb

## 2014-01-16 DIAGNOSIS — I82401 Acute embolism and thrombosis of unspecified deep veins of right lower extremity: Secondary | ICD-10-CM

## 2014-01-16 DIAGNOSIS — Z853 Personal history of malignant neoplasm of breast: Secondary | ICD-10-CM

## 2014-01-16 DIAGNOSIS — I82409 Acute embolism and thrombosis of unspecified deep veins of unspecified lower extremity: Secondary | ICD-10-CM

## 2014-01-16 DIAGNOSIS — D6851 Activated protein C resistance: Secondary | ICD-10-CM

## 2014-01-16 DIAGNOSIS — D500 Iron deficiency anemia secondary to blood loss (chronic): Secondary | ICD-10-CM

## 2014-01-16 DIAGNOSIS — Z96651 Presence of right artificial knee joint: Secondary | ICD-10-CM

## 2014-01-16 HISTORY — DX: Activated protein C resistance: D68.51

## 2014-01-16 LAB — CBC WITH DIFFERENTIAL/PLATELET
BASOS ABS: 0 10*3/uL (ref 0.0–0.1)
Basophils Relative: 0 % (ref 0–1)
EOS ABS: 0.2 10*3/uL (ref 0.0–0.7)
Eosinophils Relative: 3 % (ref 0–5)
HCT: 34.6 % — ABNORMAL LOW (ref 36.0–46.0)
Hemoglobin: 10.8 g/dL — ABNORMAL LOW (ref 12.0–15.0)
Lymphocytes Relative: 21 % (ref 12–46)
Lymphs Abs: 1.4 10*3/uL (ref 0.7–4.0)
MCH: 27.4 pg (ref 26.0–34.0)
MCHC: 31.2 g/dL (ref 30.0–36.0)
MCV: 87.8 fL (ref 78.0–100.0)
Monocytes Absolute: 0.6 10*3/uL (ref 0.1–1.0)
Monocytes Relative: 8 % (ref 3–12)
NEUTROS PCT: 68 % (ref 43–77)
Neutro Abs: 4.7 10*3/uL (ref 1.7–7.7)
PLATELETS: 337 10*3/uL (ref 150–400)
RBC: 3.94 MIL/uL (ref 3.87–5.11)
RDW: 19.9 % — AB (ref 11.5–15.5)
WBC: 6.9 10*3/uL (ref 4.0–10.5)

## 2014-01-16 LAB — IRON AND TIBC
%SAT: 9 % — ABNORMAL LOW (ref 20–55)
Iron: 29 ug/dL — ABNORMAL LOW (ref 42–145)
TIBC: 325 ug/dL (ref 250–470)
UIBC: 296 ug/dL (ref 125–400)

## 2014-01-16 LAB — PROTIME-INR
INR: 2.7 — ABNORMAL HIGH (ref <1.50)
Prothrombin Time: 27.7 seconds — ABNORMAL HIGH (ref 11.6–15.2)

## 2014-01-16 LAB — FERRITIN: FERRITIN: 40 ng/mL (ref 10–291)

## 2014-01-16 NOTE — Progress Notes (Signed)
Patient ID: Samantha Ali, female   DOB: Nov 15, 1945, 68 y.o.   MRN: [id number] Hematology and Oncology Follow Up Visit  Samantha Ali [id number] [birth year] 68 y.o. 01/16/2014 2:36 PM   Principle Diagnosis: Encounter Diagnoses  Name Primary?  . Status post total right knee replacement   . DVT (deep venous thrombosis), right Yes  . Factor 5 Leiden mutation, heterozygous   . Heterozygous factor V Leiden mutation      Interim History:   Followup visit for this pleasant 68 year old woman I initially evaluated here in April when she was found to have extension of a DVT in her right leg while on Xarelto anticoagulation. She had a provoked right lower extremity DVT in the popliteal vein one week following a right total knee replacement which was done on 08/09/2013. She had no other risk factors for clotting and in fact had taken estrogen for many years without any problems and subsequently hormonal therapy for breast cancer. She had a remote episode of superficial phlebitis in the right leg involving the varicose vein in 1996 treated conservatively. Hypercoagulation evaluation revealed that she is a heterozygote for the factor V Leiden gene mutation. I started her on Coumadin. Before she even became therapeutic she ran into problems with first what she thought was vaginal bleeding but was subsequently determined to be hematuria. I put her back on Lovenox while she was under evaluation. A cystoscopy was normal. The bleeding was self-limited and has stopped. Coumadin now therapeutic at a dose of 10 mg Tuesday Thursdays and Saturdays and 7.5 mg other days of the week. INR today 2.7.  She's had no new problems. She had a prior history of a large subconjunctival hemorrhage a number of years ago and she thought she was starting to develop another one in her right eye recently. She is wearing an elastic stocking on her right leg. No further swelling or pain. No dyspnea or chest pain.  Medications:  reviewed  Allergies: No Known Allergies  Review of Systems: Hematology:  No recurrent bleeding or bruising ENT ROS:  Breast ROS:  Respiratory ROS: See above Cardiovascular ROS: See above  Gastrointestinal ROS: I put her on iron replacement and she took triple the recommended dose and is now having loose bowel movements.    Genito-Urinary ROS: See above  Musculoskeletal ROS: See above Neurological ROS: See above Dermatological ROS: No rash Remaining ROS negative:   Physical Exam: Blood pressure 125/73, pulse 88, temperature 99.3 F (37.4 C), temperature source Oral, resp. rate 20, height 5\' 3"  (1.6 m), weight 173 lb 4.8 oz (78.608 kg), SpO2 97.00%. Wt Readings from Last 3 Encounters:  01/16/14 173 lb 4.8 oz (78.608 kg)  11/21/13 171 lb 4.8 oz (77.701 kg)  11/15/13 173 lb 12.8 oz (78.835 kg)     General appearance: Well-nourished Caucasian woman HENNT: Pharynx no erythema, exudate, mass, or ulcer. No thyromegaly or thyroid nodules Lymph nodes: No cervical, supraclavicular, or axillary lymphadenopathy Breasts:  Lungs: Clear to auscultation, resonant to percussion throughout Heart: Regular rhythm, no murmur, no gallop, no rub, no click, no edema Abdomen: Soft, nontender, normal bowel sounds, no mass, no organomegaly Extremities: No edema, no calf tenderness Musculoskeletal: no joint deformities GU:  Vascular: Carotid pulses 2+, no bruits,  Neurologic: Alert, oriented, PERRLA, optic discs sharp and vessels normal, no hemorrhage or exudate increased vascularization right sclera but no subconjunctival hemorrhage., cranial nerves grossly normal, motor strength 5 over 5, reflexes 1+ symmetric, upper body coordination normal, gait normal, Skin: No  rash or ecchymosis  Lab Results: CBC W/Diff    Component Value Date/Time   WBC 6.9 01/16/2014 0955   WBC 8.5 05/30/2013 1040   RBC 3.94 01/16/2014 0955   RBC 4.20 11/15/2013 0630   RBC 4.39 05/30/2013 1040   HGB 10.8* 01/16/2014 0955   HGB  12.6 05/30/2013 1040   HCT 34.6* 01/16/2014 0955   HCT 38.6 05/30/2013 1040   PLT 337 01/16/2014 0955   PLT 290 05/30/2013 1040   MCV 87.8 01/16/2014 0955   MCV 87.9 05/30/2013 1040   MCH 27.4 01/16/2014 0955   MCH 28.7 05/30/2013 1040   MCHC 31.2 01/16/2014 0955   MCHC 32.6 05/30/2013 1040   RDW 19.9* 01/16/2014 0955   RDW 16.3* 05/30/2013 1040   LYMPHSABS 1.4 01/16/2014 0955   LYMPHSABS 2.2 05/30/2013 1040   MONOABS 0.6 01/16/2014 0955   MONOABS 0.7 05/30/2013 1040   EOSABS 0.2 01/16/2014 0955   EOSABS 0.2 05/30/2013 1040   BASOSABS 0.0 01/16/2014 0955   BASOSABS 0.1 05/30/2013 1040     Chemistry      Component Value Date/Time   NA 143 11/15/2013 0630   NA 143 05/30/2013 1040   K 3.8 11/15/2013 0630   K 4.6 05/30/2013 1040   CL 103 11/15/2013 0630   CL 105 05/05/2012 1058   CO2 27 11/15/2013 0630   CO2 27 05/30/2013 1040   BUN 13 11/15/2013 0630   BUN 14.4 05/30/2013 1040   CREATININE 0.92 11/15/2013 0630   CREATININE 1.1 05/30/2013 1040      Component Value Date/Time   CALCIUM 9.6 11/15/2013 0630   CALCIUM 10.1 05/30/2013 1040   ALKPHOS 101 11/15/2013 0630   ALKPHOS 88 05/30/2013 1040   AST 24 11/15/2013 0630   AST 26 05/30/2013 1040   ALT 22 11/15/2013 0630   ALT 22 05/30/2013 1040   BILITOT 0.4 11/15/2013 0630   BILITOT 0.59 05/30/2013 1040     Protime 27.7 seconds INR 2.7   Impression:  #1. Recurrent DVT while on Xarelto in a lady found to be a heterozygote for the factor V Leiden gene mutation Currently stable on Coumadin. I plan to continue the Coumadin for another month to complete 3 additional months for a total of 6 months of anticoagulation.  #2. Stage I, estrogen receptor positive, well-differentiated invasive cancer right breast diagnosed July 2004 status post lumpectomy, radiation, and hormonal therapy    CC: Patient Care Team: Kathyrn Lass, MD as PCP - General (Family Medicine)   Annia Belt, MD 6/22/20152:36 PM

## 2014-01-16 NOTE — Patient Instructions (Signed)
Coumadin check on July 20 Then repeat labs September 21 Visit with Dr Darnell Level September 28

## 2014-02-13 ENCOUNTER — Other Ambulatory Visit: Payer: Medicare Other

## 2014-02-20 ENCOUNTER — Other Ambulatory Visit (INDEPENDENT_AMBULATORY_CARE_PROVIDER_SITE_OTHER): Payer: Medicare Other

## 2014-02-20 DIAGNOSIS — I82409 Acute embolism and thrombosis of unspecified deep veins of unspecified lower extremity: Secondary | ICD-10-CM

## 2014-02-20 DIAGNOSIS — D6851 Activated protein C resistance: Secondary | ICD-10-CM

## 2014-02-20 DIAGNOSIS — I82401 Acute embolism and thrombosis of unspecified deep veins of right lower extremity: Secondary | ICD-10-CM

## 2014-02-20 DIAGNOSIS — Z96651 Presence of right artificial knee joint: Secondary | ICD-10-CM

## 2014-02-20 LAB — CBC WITH DIFFERENTIAL/PLATELET
Basophils Absolute: 0 10*3/uL (ref 0.0–0.1)
Basophils Relative: 0 % (ref 0–1)
Eosinophils Absolute: 0.1 10*3/uL (ref 0.0–0.7)
Eosinophils Relative: 2 % (ref 0–5)
HEMATOCRIT: 40.2 % (ref 36.0–46.0)
Hemoglobin: 12.7 g/dL (ref 12.0–15.0)
LYMPHS ABS: 1.7 10*3/uL (ref 0.7–4.0)
LYMPHS PCT: 29 % (ref 12–46)
MCH: 28.6 pg (ref 26.0–34.0)
MCHC: 28.6 g/dL — ABNORMAL LOW (ref 30.0–36.0)
MCV: 90.5 fL (ref 78.0–100.0)
Monocytes Absolute: 0.4 10*3/uL (ref 0.1–1.0)
Monocytes Relative: 7 % (ref 3–12)
Neutro Abs: 3.6 10*3/uL (ref 1.7–7.7)
Neutrophils Relative %: 62 % (ref 43–77)
PLATELETS: 339 10*3/uL (ref 150–400)
RBC: 4.44 MIL/uL (ref 3.87–5.11)
RDW: 17.8 % — ABNORMAL HIGH (ref 11.5–15.5)
WBC: 5.8 10*3/uL (ref 4.0–10.5)

## 2014-02-20 LAB — PROTIME-INR
INR: 2.8 — ABNORMAL HIGH (ref <1.50)
Prothrombin Time: 29.8 seconds — ABNORMAL HIGH (ref 11.6–15.2)

## 2014-02-20 LAB — D-DIMER, QUANTITATIVE: D-Dimer, Quant: 0.97 ug/mL-FEU — ABNORMAL HIGH (ref 0.00–0.48)

## 2014-02-21 ENCOUNTER — Telehealth: Payer: Self-pay | Admitting: *Deleted

## 2014-02-21 NOTE — Telephone Encounter (Signed)
Message copied by Ebbie Latus on Tue Feb 21, 2014 11:44 AM ------      Message from: Annia Belt      Created: Mon Feb 20, 2014  4:42 PM       Call pt: coumadin level good at 2.8; d-dimer test for activated clotting system still elevated so I want her to stay on the coumadin. Please have her make a follow up appt with me for August or September. I do not see one on the books. ------

## 2014-02-21 NOTE — Telephone Encounter (Signed)
Called pt - pt informed Coumadin level 2.8; D-dimer level elevated and to stay on her coumadin dose per Dr Beryle Beams. I will have Susette Racer to call about an appt in Sept; she has a lab appt Sept 23.

## 2014-02-22 ENCOUNTER — Other Ambulatory Visit: Payer: Self-pay | Admitting: *Deleted

## 2014-02-22 DIAGNOSIS — D6851 Activated protein C resistance: Secondary | ICD-10-CM

## 2014-02-22 DIAGNOSIS — I82401 Acute embolism and thrombosis of unspecified deep veins of right lower extremity: Secondary | ICD-10-CM

## 2014-02-22 DIAGNOSIS — Z96651 Presence of right artificial knee joint: Secondary | ICD-10-CM

## 2014-02-22 MED ORDER — WARFARIN SODIUM 5 MG PO TABS: ORAL_TABLET | ORAL | Status: DC

## 2014-02-22 NOTE — Telephone Encounter (Signed)
Dr Beryle Beams is out of town until Monday.

## 2014-02-22 NOTE — Telephone Encounter (Signed)
Dr Sabra Heck is PCP. Pt does see Dr Beryle Beams for FVL and he last saw her 01/16/14 and stated he would cont the warfarin for one month more to complete course. Dr Darnell Level is out. I am not comfortable refilling based on his last note. Will CC Dr Sabra Heck who knows the pt and may have more details.

## 2014-02-22 NOTE — Telephone Encounter (Signed)
Phone note 7/28 indicated d dimer elevated and Dr Darnell Level wanted to cont warfarin. Refilled.

## 2014-03-23 ENCOUNTER — Other Ambulatory Visit: Payer: Self-pay | Admitting: *Deleted

## 2014-03-23 DIAGNOSIS — Z96651 Presence of right artificial knee joint: Secondary | ICD-10-CM

## 2014-03-23 DIAGNOSIS — I82401 Acute embolism and thrombosis of unspecified deep veins of right lower extremity: Secondary | ICD-10-CM

## 2014-03-23 DIAGNOSIS — D6851 Activated protein C resistance: Secondary | ICD-10-CM

## 2014-03-23 MED ORDER — WARFARIN SODIUM 5 MG PO TABS: ORAL_TABLET | ORAL | Status: DC

## 2014-03-23 NOTE — Telephone Encounter (Signed)
Can she come in for an INR check?  Last INR was 7/27.

## 2014-03-23 NOTE — Telephone Encounter (Signed)
Pt is going to [place]; needs a refill.  She's on Coumadin 5 mg - takes 1.5 tabs on MWFand Sun; 2 tabs on T,Thu and Sat.

## 2014-04-19 ENCOUNTER — Other Ambulatory Visit (INDEPENDENT_AMBULATORY_CARE_PROVIDER_SITE_OTHER): Payer: Medicare Other

## 2014-04-19 DIAGNOSIS — Z96651 Presence of right artificial knee joint: Secondary | ICD-10-CM

## 2014-04-19 DIAGNOSIS — D6851 Activated protein C resistance: Secondary | ICD-10-CM

## 2014-04-19 DIAGNOSIS — I82401 Acute embolism and thrombosis of unspecified deep veins of right lower extremity: Secondary | ICD-10-CM

## 2014-04-19 DIAGNOSIS — Z96659 Presence of unspecified artificial knee joint: Secondary | ICD-10-CM

## 2014-04-19 LAB — CBC WITH DIFFERENTIAL/PLATELET
Basophils Absolute: 0 10*3/uL (ref 0.0–0.1)
Basophils Relative: 0 % (ref 0–1)
EOS PCT: 3 % (ref 0–5)
Eosinophils Absolute: 0.2 10*3/uL (ref 0.0–0.7)
HCT: 39.2 % (ref 36.0–46.0)
Hemoglobin: 12.8 g/dL (ref 12.0–15.0)
LYMPHS ABS: 2 10*3/uL (ref 0.7–4.0)
LYMPHS PCT: 30 % (ref 12–46)
MCH: 28.5 pg (ref 26.0–34.0)
MCHC: 32.7 g/dL (ref 30.0–36.0)
MCV: 87.3 fL (ref 78.0–100.0)
Monocytes Absolute: 0.5 10*3/uL (ref 0.1–1.0)
Monocytes Relative: 7 % (ref 3–12)
NEUTROS ABS: 4.1 10*3/uL (ref 1.7–7.7)
Neutrophils Relative %: 60 % (ref 43–77)
PLATELETS: 285 10*3/uL (ref 150–400)
RBC: 4.49 MIL/uL (ref 3.87–5.11)
RDW: 16.1 % — ABNORMAL HIGH (ref 11.5–15.5)
WBC: 6.8 10*3/uL (ref 4.0–10.5)

## 2014-04-19 LAB — PROTIME-INR
INR: 2.97 — ABNORMAL HIGH (ref <1.50)
Prothrombin Time: 30.9 seconds — ABNORMAL HIGH (ref 11.6–15.2)

## 2014-04-19 LAB — FERRITIN: FERRITIN: 30 ng/mL (ref 10–291)

## 2014-04-19 LAB — IRON AND TIBC
%SAT: 13 % — ABNORMAL LOW (ref 20–55)
Iron: 44 ug/dL (ref 42–145)
TIBC: 351 ug/dL (ref 250–470)
UIBC: 307 ug/dL (ref 125–400)

## 2014-04-19 LAB — D-DIMER, QUANTITATIVE (NOT AT ARMC): D-Dimer, Quant: 0.58 ug/mL-FEU — ABNORMAL HIGH (ref 0.00–0.48)

## 2014-04-20 ENCOUNTER — Telehealth: Payer: Self-pay | Admitting: *Deleted

## 2014-04-20 NOTE — Telephone Encounter (Signed)
Message copied by Ebbie Latus on Thu Apr 20, 2014 12:09 PM ------      Message from: Annia Belt      Created: Wed Apr 19, 2014  7:55 PM       Call pt:  Coumadin level OK  Stay on same dose for now.  Remind her she has an appt with me on Monday 9/28 & thanks for the pineapple!! ------

## 2014-04-20 NOTE — Telephone Encounter (Signed)
Pt/called informed Coumadin level is OK and to stay on same dose per Dr Beryle Beams. Pt reminded of her appt on Monday 9/28. Also thanked her for the pineapple.

## 2014-04-24 ENCOUNTER — Ambulatory Visit (INDEPENDENT_AMBULATORY_CARE_PROVIDER_SITE_OTHER): Payer: Medicare Other | Admitting: Oncology

## 2014-04-24 ENCOUNTER — Encounter: Payer: Self-pay | Admitting: Oncology

## 2014-04-24 VITALS — BP 140/71 | HR 82 | Temp 98.0°F | Resp 20 | Ht 62.5 in | Wt 164.7 lb

## 2014-04-24 DIAGNOSIS — D6851 Activated protein C resistance: Secondary | ICD-10-CM

## 2014-04-24 DIAGNOSIS — I82401 Acute embolism and thrombosis of unspecified deep veins of right lower extremity: Secondary | ICD-10-CM

## 2014-04-24 DIAGNOSIS — I82409 Acute embolism and thrombosis of unspecified deep veins of unspecified lower extremity: Secondary | ICD-10-CM

## 2014-04-24 DIAGNOSIS — D6859 Other primary thrombophilia: Secondary | ICD-10-CM

## 2014-04-24 NOTE — Patient Instructions (Signed)
Change coumadin to  2 x 5 mg tabs = 10 mg on Mondays and thursdays                                    1 1/2 pills = 7.5 mg all other days of the week check lab again in 2 weeks & 4 weeks Visit with Dr Darnell Level in 6 weeks

## 2014-04-24 NOTE — Progress Notes (Signed)
Patient ID: Samantha Ali, female   DOB: 10-13-1945, 68 y.o.   MRN: [id number] Hematology and Oncology Follow Up Visit  Samantha Ali [id number] [birth year] 68 y.o. 04/24/2014 2:23 PM   Principle Diagnosis: Encounter Diagnoses  Name Primary?  . DVT (deep venous thrombosis), right Yes  . Heterozygous factor V Leiden mutation      Interim History:   Clinical summary June 22 note: Followup visit for this pleasant 68 year old woman I initially evaluated here in April when she was found to have extension of a DVT in her right leg while on Xarelto anticoagulation. She had a provoked right lower extremity DVT in the popliteal vein one week following a right total knee replacement which was done on 08/09/2013. She had no other risk factors for clotting and in fact had taken estrogen for many years without any problems and subsequently hormonal therapy for breast cancer. She had a remote episode of superficial phlebitis in the right leg involving the varicose vein in 1996 treated conservatively.  Hypercoagulation evaluation revealed that she is a heterozygote for the factor V Leiden gene mutation.  I started her on Coumadin. Before she even became therapeutic she ran into problems with first what she thought was vaginal bleeding but was subsequently determined to be hematuria. I put her back on Lovenox while she was under evaluation. A cystoscopy was normal. The bleeding was self-limited and has stopped. Coumadin now therapeutic at a dose of 10 mg Tuesday Thursdays and Saturdays and 7.5 mg other days of the week. INR today 2.7.  Interim followup. She remains on Coumadin. No further hematuria. Still some discomfort in her right calf. She is wearing an elastic stocking on a regular basis. She recently returned from a nice trip to [place] and had no problems. She has now been on the Coumadin for 5 months. D-dimer is coming down but still borderline elevated at 0.58 units normal up to 0.48. Peak value since going  on Coumadin 0.97. She denies any dyspnea, chest pain, palpitations, recurrent leg swelling or pain.    Medications: reviewed  Allergies: No Known Allergies  Review of Systems: See history of present illness Remaining ROS negative:   Physical Exam: Blood pressure 140/71, pulse 82, temperature 98 F (36.7 C), temperature source Oral, resp. rate 20, height 5' 2.5" (1.588 m), weight 164 lb 11.2 oz (74.707 kg), SpO2 98.00%. Wt Readings from Last 3 Encounters:  04/24/14 164 lb 11.2 oz (74.707 kg)  01/16/14 173 lb 4.8 oz (78.608 kg)  11/21/13 171 lb 4.8 oz (77.701 kg)     General appearance: Well-nourished Caucasian woman HENNT: Pharynx no erythema, exudate, mass, or ulcer. No thyromegaly or thyroid nodules Lymph nodes: No cervical, supraclavicular, or axillary lymphadenopathy Breasts:  Lungs: Clear to auscultation, resonant to percussion throughout Heart: Regular rhythm, no murmur, no gallop, no rub, no click, no edema Abdomen: Soft, nontender, normal bowel sounds, no mass, no organomegaly Extremities: No edema, no calf tenderness Musculoskeletal: no joint deformities. Right calf 37.5 cm, left calf 38 cm GU:  Vascular: Carotid pulses 2+, no bruits, Neurologic: Alert, oriented, PERRLA, optic discs sharp and vessels normal, no hemorrhage or exudate, cranial nerves grossly normal, motor strength 5 over 5, reflexes 1+ symmetric, upper body coordination normal, gait normal, Skin: No rash or ecchymosis  Lab Results: CBC W/Diff    Component Value Date/Time   WBC 6.8 04/19/2014 1332   WBC 8.5 05/30/2013 1040   RBC 4.49 04/19/2014 1332   RBC 4.20 11/15/2013 0630  RBC 4.39 05/30/2013 1040   HGB 12.8 04/19/2014 1332   HGB 12.6 05/30/2013 1040   HCT 39.2 04/19/2014 1332   HCT 38.6 05/30/2013 1040   PLT 285 04/19/2014 1332   PLT 290 05/30/2013 1040   MCV 87.3 04/19/2014 1332   MCV 87.9 05/30/2013 1040   MCH 28.5 04/19/2014 1332   MCH 28.7 05/30/2013 1040   MCHC 32.7 04/19/2014 1332   MCHC 32.6  05/30/2013 1040   RDW 16.1* 04/19/2014 1332   RDW 16.3* 05/30/2013 1040   LYMPHSABS 2.0 04/19/2014 1332   LYMPHSABS 2.2 05/30/2013 1040   MONOABS 0.5 04/19/2014 1332   MONOABS 0.7 05/30/2013 1040   EOSABS 0.2 04/19/2014 1332   EOSABS 0.2 05/30/2013 1040   BASOSABS 0.0 04/19/2014 1332   BASOSABS 0.1 05/30/2013 1040     Chemistry      Component Value Date/Time   NA 143 11/15/2013 0630   NA 143 05/30/2013 1040   K 3.8 11/15/2013 0630   K 4.6 05/30/2013 1040   CL 103 11/15/2013 0630   CL 105 05/05/2012 1058   CO2 27 11/15/2013 0630   CO2 27 05/30/2013 1040   BUN 13 11/15/2013 0630   BUN 14.4 05/30/2013 1040   CREATININE 0.92 11/15/2013 0630   CREATININE 1.1 05/30/2013 1040      Component Value Date/Time   CALCIUM 9.6 11/15/2013 0630   CALCIUM 10.1 05/30/2013 1040   ALKPHOS 101 11/15/2013 0630   ALKPHOS 88 05/30/2013 1040   AST 24 11/15/2013 0630   AST 26 05/30/2013 1040   ALT 22 11/15/2013 0630   ALT 22 05/30/2013 1040   BILITOT 0.4 11/15/2013 0630   BILITOT 0.59 05/30/2013 1040    INR 2.97 04/19/2014 on Coumadin 3 days per week, 7.5 mg 4 days per week    Impression:  #1. Provoked right lower extremity DVT with clot extension while on Xarelto. She has  now completed an additional 5 months of warfarin. I am going to give her one additional month in view of the persistent borderline elevation of the d-dimer. Am making a slight dose adjustment today. She will decrease to 10 mg on Mondays and Thursdays. 7.5 mg on the other days of the week.  repeat PT/INR in 2 weeks.  #2. Transient hematuria at initiation of Coumadin therapy with no uropathology determined. Self resolved.  #3. Status post right total knee replacement for degenerative arthritis.    CC: Patient Care Team: Kathyrn Lass, MD as PCP - General (Family Medicine)   Annia Belt, MD 9/28/20152:23 PM

## 2014-05-08 ENCOUNTER — Other Ambulatory Visit: Payer: Medicare Other

## 2014-05-08 ENCOUNTER — Other Ambulatory Visit: Payer: Self-pay | Admitting: Internal Medicine

## 2014-05-09 ENCOUNTER — Other Ambulatory Visit: Payer: Self-pay | Admitting: Internal Medicine

## 2014-05-10 ENCOUNTER — Other Ambulatory Visit: Payer: Self-pay | Admitting: *Deleted

## 2014-05-10 NOTE — Telephone Encounter (Signed)
Dual request

## 2014-05-11 ENCOUNTER — Other Ambulatory Visit (INDEPENDENT_AMBULATORY_CARE_PROVIDER_SITE_OTHER): Payer: Medicare Other

## 2014-05-11 ENCOUNTER — Other Ambulatory Visit: Payer: Self-pay | Admitting: Oncology

## 2014-05-11 DIAGNOSIS — D6851 Activated protein C resistance: Secondary | ICD-10-CM

## 2014-05-11 DIAGNOSIS — I82401 Acute embolism and thrombosis of unspecified deep veins of right lower extremity: Secondary | ICD-10-CM

## 2014-05-11 LAB — CBC WITH DIFFERENTIAL/PLATELET
Basophils Absolute: 0.1 10*3/uL (ref 0.0–0.1)
Basophils Relative: 1 % (ref 0–1)
EOS PCT: 3 % (ref 0–5)
Eosinophils Absolute: 0.2 10*3/uL (ref 0.0–0.7)
HCT: 37.3 % (ref 36.0–46.0)
Hemoglobin: 12.1 g/dL (ref 12.0–15.0)
LYMPHS PCT: 28 % (ref 12–46)
Lymphs Abs: 1.8 10*3/uL (ref 0.7–4.0)
MCH: 28.6 pg (ref 26.0–34.0)
MCHC: 32.4 g/dL (ref 30.0–36.0)
MCV: 88.2 fL (ref 78.0–100.0)
Monocytes Absolute: 0.6 10*3/uL (ref 0.1–1.0)
Monocytes Relative: 9 % (ref 3–12)
NEUTROS PCT: 59 % (ref 43–77)
Neutro Abs: 3.8 10*3/uL (ref 1.7–7.7)
PLATELETS: 254 10*3/uL (ref 150–400)
RBC: 4.23 MIL/uL (ref 3.87–5.11)
RDW: 15.8 % — ABNORMAL HIGH (ref 11.5–15.5)
WBC: 6.5 10*3/uL (ref 4.0–10.5)

## 2014-05-11 LAB — PROTIME-INR
INR: 3 — ABNORMAL HIGH (ref <1.50)
PROTHROMBIN TIME: 31.4 s — AB (ref 11.6–15.2)

## 2014-05-19 ENCOUNTER — Telehealth: Payer: Self-pay | Admitting: *Deleted

## 2014-05-19 NOTE — Telephone Encounter (Signed)
Pt notified that Dr. Beryle Beams has reviewed her phone call and current labs and does not see a need to change medications or do any further testing at this time since pt has no other signs of bleeding or discomfort, that perhaps she accidentally bit her tongue while sleeping or eating, without noticing. Pt already has appt to come in for PT lab on Monday, which she needs to keep, and he will let her know after the lab work if any further changes needed. In the mean time, if pt has any other concerns about blood clot or other bleeding issues, she is welcome to call. Pt again denied any other c/o discomfort or signs of bleeding and is able to repeat back these instructions, including coming for labwork on 05/22/14, as already arranged. Yvonna Alanis, RN, 05/19/14, 12:04 PM

## 2014-05-19 NOTE — Telephone Encounter (Signed)
Found blood clot in mouth this am, dark blood red, about size of a pencil eraser, no sore spots in mouth, saw Dr. Sabra Heck for h/a 2 days ago (got Migraine med from Dr. Sabra Heck) and everything was fine - same thing happened a few months ago but did not tell anyone at that time. Pt concerned about blood clot in mouth but no discomfort or any other signs of bleeding or other c/o discomfort. Info forwarded to Dr. Beryle Beams and pt told that this RN would call her back this afternoon after contacting Dr. Beryle Beams. Yvonna Alanis, RN, 05/19/14, 11:55AM

## 2014-05-25 ENCOUNTER — Other Ambulatory Visit (INDEPENDENT_AMBULATORY_CARE_PROVIDER_SITE_OTHER): Payer: Medicare Other

## 2014-05-25 ENCOUNTER — Telehealth: Payer: Self-pay | Admitting: *Deleted

## 2014-05-25 DIAGNOSIS — I82401 Acute embolism and thrombosis of unspecified deep veins of right lower extremity: Secondary | ICD-10-CM

## 2014-05-25 LAB — PROTIME-INR
INR: 2.8 — ABNORMAL HIGH (ref <1.50)
Prothrombin Time: 29.8 seconds — ABNORMAL HIGH (ref 11.6–15.2)

## 2014-05-25 LAB — D-DIMER, QUANTITATIVE (NOT AT ARMC): D DIMER QUANT: 0.5 ug{FEU}/mL — AB (ref 0.00–0.48)

## 2014-05-25 NOTE — Telephone Encounter (Signed)
Message copied by Ebbie Latus on Thu May 25, 2014 11:59 AM ------      Message from: Annia Belt      Created: Thu May 25, 2014 11:37 AM       Call pt: coumadin level good; stay on current dose.  Will likely stop coumadin soon ------

## 2014-05-25 NOTE — Telephone Encounter (Signed)
Pt called/informed -  coumadin level good and stay on current dose -will likely stop coumadin soon per Dr Beryle Beams.

## 2014-06-05 ENCOUNTER — Other Ambulatory Visit: Payer: Medicare Other

## 2014-06-06 ENCOUNTER — Other Ambulatory Visit: Payer: Self-pay | Admitting: Oncology

## 2014-06-06 DIAGNOSIS — D6851 Activated protein C resistance: Secondary | ICD-10-CM

## 2014-06-06 DIAGNOSIS — I82401 Acute embolism and thrombosis of unspecified deep veins of right lower extremity: Secondary | ICD-10-CM

## 2014-06-07 ENCOUNTER — Other Ambulatory Visit (INDEPENDENT_AMBULATORY_CARE_PROVIDER_SITE_OTHER): Payer: Medicare Other

## 2014-06-07 DIAGNOSIS — I82401 Acute embolism and thrombosis of unspecified deep veins of right lower extremity: Secondary | ICD-10-CM

## 2014-06-07 DIAGNOSIS — D6851 Activated protein C resistance: Secondary | ICD-10-CM

## 2014-06-07 LAB — PROTIME-INR
INR: 2.34 — ABNORMAL HIGH (ref <1.50)
PROTHROMBIN TIME: 25.8 s — AB (ref 11.6–15.2)

## 2014-06-07 LAB — CBC WITH DIFFERENTIAL/PLATELET
Basophils Absolute: 0 10*3/uL (ref 0.0–0.1)
Basophils Relative: 0 % (ref 0–1)
Eosinophils Absolute: 0.1 10*3/uL (ref 0.0–0.7)
Eosinophils Relative: 2 % (ref 0–5)
HCT: 36.2 % (ref 36.0–46.0)
Hemoglobin: 11.7 g/dL — ABNORMAL LOW (ref 12.0–15.0)
LYMPHS ABS: 1.6 10*3/uL (ref 0.7–4.0)
LYMPHS PCT: 26 % (ref 12–46)
MCH: 29.1 pg (ref 26.0–34.0)
MCHC: 33.2 g/dL (ref 30.0–36.0)
MCV: 90 fL (ref 78.0–100.0)
Monocytes Absolute: 0.6 10*3/uL (ref 0.1–1.0)
Monocytes Relative: 9 % (ref 3–12)
NEUTROS ABS: 4 10*3/uL (ref 1.7–7.7)
NEUTROS PCT: 63 % (ref 43–77)
PLATELETS: 278 10*3/uL (ref 150–400)
RBC: 4.02 MIL/uL (ref 3.87–5.11)
RDW: 15.4 % (ref 11.5–15.5)
WBC: 6.3 10*3/uL (ref 4.0–10.5)

## 2014-06-07 LAB — D-DIMER, QUANTITATIVE (NOT AT ARMC): D DIMER QUANT: 0.44 ug{FEU}/mL (ref 0.00–0.48)

## 2014-06-08 ENCOUNTER — Telehealth: Payer: Self-pay | Admitting: *Deleted

## 2014-06-08 NOTE — Telephone Encounter (Signed)
Pt called/informed "coumadin level just right but we can stop it" per Dr Beryle Beams.Pt excited about stopping Coumadin. Confirmed appt Dec 1 @ 1045AM.

## 2014-06-08 NOTE — Telephone Encounter (Signed)
-----   Message from Annia Belt, MD sent at 06/07/2014  6:03 PM EST ----- Call pt: we finally got coumadin level just right but we can now stop it!!!

## 2014-06-27 ENCOUNTER — Encounter: Payer: Self-pay | Admitting: Oncology

## 2014-06-27 ENCOUNTER — Ambulatory Visit (INDEPENDENT_AMBULATORY_CARE_PROVIDER_SITE_OTHER): Payer: Medicare Other | Admitting: Oncology

## 2014-06-27 VITALS — BP 139/73 | HR 80 | Temp 98.9°F | Ht 62.5 in | Wt 169.1 lb

## 2014-06-27 DIAGNOSIS — I82401 Acute embolism and thrombosis of unspecified deep veins of right lower extremity: Secondary | ICD-10-CM

## 2014-06-27 DIAGNOSIS — Z96651 Presence of right artificial knee joint: Secondary | ICD-10-CM

## 2014-06-27 DIAGNOSIS — D6851 Activated protein C resistance: Secondary | ICD-10-CM

## 2014-06-27 DIAGNOSIS — D688 Other specified coagulation defects: Secondary | ICD-10-CM

## 2014-06-27 NOTE — Patient Instructions (Signed)
Return in 1 year or as needed if you are going to have anymore surgery before then so I can work with Engineer, production

## 2014-06-27 NOTE — Progress Notes (Signed)
Patient ID: TASHANDA FUHRER, female   DOB: 07-Mar-1946, 68 y.o.   MRN: [id number] Brief follow-up visit for this 68 year old woman with a history of a provoked right lower extremity DVT in a popliteal vein 1 week post total right knee replacement in January 2015. She was found to be a heterozygote for factor V Leiden gene mutation. She was put on Xarelto. Just about the time Xarelto  was due to be stopped, she suffered a extension of the DVT. I evaluated her at that time and started her on Coumadin. We had initial difficulty secondary to hematuria. Urologic evaluation unremarkable. Hematuria stopped. She was kept on the Coumadin. I elected to extend her course of Coumadin secondary to persistent elevation of the  D-dimer. This slowly came down over time and most recent value now in the normal range at 0.44 on 06/07/2014 and I felt it was safe to stop her anticoagulation at that time. Other than some erythema on the lateral aspect of her right leg, she has not had any additional symptomatology. No recurrent pain or swelling of the calf.  Brief exam today limited to the leg shows an area of erythema extending over about 20 cm lateral aspect distal calf. Calf itself is nontender. Healed incision over the knee.  Impression: #1. Provoked DVT related to orthopedic surgery #2. Xarelto failure #3. Self-limited Coumadin related hematuria now resolved #4. Persistent elevation of d-dimer which has now normalized. #5. Factor V Leiden heterozygote  Plan: Observation alone. She is advised to call if she needs any additional surgery. She will need thromboprophylaxis around any future procedures.

## 2014-07-14 ENCOUNTER — Ambulatory Visit (HOSPITAL_COMMUNITY)
Admission: RE | Admit: 2014-07-14 | Discharge: 2014-07-14 | Disposition: A | Discharge status: Home / Self Care | Payer: Medicare Other | Source: Ambulatory Visit | Attending: Cardiology | Admitting: Cardiology

## 2014-07-14 ENCOUNTER — Other Ambulatory Visit (HOSPITAL_COMMUNITY): Payer: Self-pay | Admitting: Orthopaedic Surgery

## 2014-07-14 DIAGNOSIS — M79604 Pain in right leg: Secondary | ICD-10-CM | POA: Diagnosis present

## 2014-07-14 DIAGNOSIS — M79661 Pain in right lower leg: Secondary | ICD-10-CM

## 2014-07-14 DIAGNOSIS — M7989 Other specified soft tissue disorders: Secondary | ICD-10-CM

## 2014-07-14 NOTE — Progress Notes (Signed)
Right Lower Extremity Venous Duplex Completed. No evidence for DVT or SVT. °Brianna L Mazza,RVT °

## 2014-07-15 ENCOUNTER — Other Ambulatory Visit: Payer: Self-pay | Admitting: Nurse Practitioner

## 2014-07-17 ENCOUNTER — Telehealth: Payer: Self-pay | Admitting: *Deleted

## 2014-07-17 NOTE — Telephone Encounter (Signed)
I discussed case with Samantha Ali and agree with documented information.  Patient was advised to be seen based on symptoms.

## 2014-07-17 NOTE — Telephone Encounter (Signed)
Pt states she noted right lower leg soreness below her calf starting approx 1 week ago and "reminded" her of a similar discomfort she had with past DVT in April, 2015. She states she called her orthopedic dr. Dr. Latanya Maudlin about the soreness, (noting he had been the dr to order the last doppler in April, which she discovered while have PT) and Dr. Latanya Maudlin had ordered the doppler to be done on on her right lower leg on 07/14/14, which pt reports she was told was negative. She noted over the week-end a similar soreness starting in her lower left leg below the calf. She states Dr. Beryle Beams had her stop her Coumadin on 06/08/14 and now she wonders if she should restart Coumadin given her history, the similar soreness, despite the negative right doppler done 3 days ago.  Pt's question discussed with Dr. Daryll Drown, the attending physician on duty, who advised that pt needed to be physically evaluated, that prescribing Coumadin based on a phone conversation and reported symptoms would not be advisable. Pt has a PCP outside this clinic, Kathyrn Lass. Pt also states Dr. Latanya Maudlin is now out of the country. Dr. Beryle Beams is not in state this week either.  Pt advised to come to clinic this afternoon to see one of [facility] residents or to call her PCP for an appt, or to come to [facility] tomorrow. Pt states she understands her options and prefers to "wait to see if the soreness gets any better." Pt advised to go to the ED for any shortness of breath, chest pain, increased lower extremity pain; and the offer to see an [facility] resident today or tomorrow or to see her PCP was repeated.  Pt thanked this RN for her immediate response and repeats she will not hesitate to come to the ED if needed and is aware of her choices for being seen in [facility] today or tomorrow or to call PCP

## 2014-07-24 ENCOUNTER — Telehealth (HOSPITAL_COMMUNITY): Payer: Self-pay | Admitting: *Deleted

## 2015-05-29 ENCOUNTER — Ambulatory Visit: Payer: Self-pay | Admitting: Oncology

## 2015-08-24 ENCOUNTER — Ambulatory Visit (HOSPITAL_COMMUNITY)
Admission: RE | Admit: 2015-08-24 | Discharge: 2015-08-24 | Disposition: A | Discharge status: Home / Self Care | Payer: Medicare Other | Source: Ambulatory Visit | Attending: Urology | Admitting: Urology

## 2015-08-24 ENCOUNTER — Other Ambulatory Visit (HOSPITAL_COMMUNITY): Payer: Self-pay | Admitting: Orthopaedic Surgery

## 2015-08-24 DIAGNOSIS — R609 Edema, unspecified: Secondary | ICD-10-CM | POA: Diagnosis not present

## 2015-08-24 DIAGNOSIS — M79604 Pain in right leg: Secondary | ICD-10-CM | POA: Insufficient documentation

## 2015-08-24 DIAGNOSIS — M79661 Pain in right lower leg: Secondary | ICD-10-CM | POA: Diagnosis not present

## 2015-10-23 DIAGNOSIS — N393 Stress incontinence (female) (male): Secondary | ICD-10-CM | POA: Diagnosis not present

## 2015-10-23 DIAGNOSIS — N760 Acute vaginitis: Secondary | ICD-10-CM | POA: Diagnosis not present

## 2015-10-23 DIAGNOSIS — Z130 Encounter for screening for diseases of the blood and blood-forming organs and certain disorders involving the immune mechanism: Secondary | ICD-10-CM | POA: Diagnosis not present

## 2015-10-23 DIAGNOSIS — Z6832 Body mass index (BMI) 32.0-32.9, adult: Secondary | ICD-10-CM | POA: Diagnosis not present

## 2015-10-23 DIAGNOSIS — Z1389 Encounter for screening for other disorder: Secondary | ICD-10-CM | POA: Diagnosis not present

## 2015-10-23 DIAGNOSIS — Z853 Personal history of malignant neoplasm of breast: Secondary | ICD-10-CM | POA: Diagnosis not present

## 2015-10-23 DIAGNOSIS — N898 Other specified noninflammatory disorders of vagina: Secondary | ICD-10-CM | POA: Diagnosis not present

## 2015-10-23 DIAGNOSIS — Z01419 Encounter for gynecological examination (general) (routine) without abnormal findings: Secondary | ICD-10-CM | POA: Diagnosis not present

## 2015-12-20 DIAGNOSIS — Z961 Presence of intraocular lens: Secondary | ICD-10-CM | POA: Diagnosis not present

## 2015-12-20 DIAGNOSIS — H26491 Other secondary cataract, right eye: Secondary | ICD-10-CM | POA: Diagnosis not present

## 2015-12-20 DIAGNOSIS — Z9842 Cataract extraction status, left eye: Secondary | ICD-10-CM | POA: Diagnosis not present

## 2016-01-28 DIAGNOSIS — Z1231 Encounter for screening mammogram for malignant neoplasm of breast: Secondary | ICD-10-CM | POA: Diagnosis not present

## 2016-01-28 DIAGNOSIS — Z853 Personal history of malignant neoplasm of breast: Secondary | ICD-10-CM | POA: Diagnosis not present

## 2016-02-19 DIAGNOSIS — G43009 Migraine without aura, not intractable, without status migrainosus: Secondary | ICD-10-CM | POA: Diagnosis not present

## 2016-02-19 DIAGNOSIS — J309 Allergic rhinitis, unspecified: Secondary | ICD-10-CM | POA: Diagnosis not present

## 2016-02-19 DIAGNOSIS — Z853 Personal history of malignant neoplasm of breast: Secondary | ICD-10-CM | POA: Diagnosis not present

## 2016-02-19 DIAGNOSIS — E7800 Pure hypercholesterolemia, unspecified: Secondary | ICD-10-CM | POA: Diagnosis not present

## 2016-02-19 DIAGNOSIS — Z6830 Body mass index (BMI) 30.0-30.9, adult: Secondary | ICD-10-CM | POA: Diagnosis not present

## 2016-02-19 DIAGNOSIS — N951 Menopausal and female climacteric states: Secondary | ICD-10-CM | POA: Diagnosis not present

## 2016-02-19 DIAGNOSIS — Z86718 Personal history of other venous thrombosis and embolism: Secondary | ICD-10-CM | POA: Diagnosis not present

## 2016-02-19 DIAGNOSIS — D6851 Activated protein C resistance: Secondary | ICD-10-CM | POA: Diagnosis not present

## 2016-02-19 DIAGNOSIS — N183 Chronic kidney disease, stage 3 (moderate): Secondary | ICD-10-CM | POA: Diagnosis not present

## 2016-02-19 DIAGNOSIS — G4700 Insomnia, unspecified: Secondary | ICD-10-CM | POA: Diagnosis not present

## 2016-02-20 DIAGNOSIS — N898 Other specified noninflammatory disorders of vagina: Secondary | ICD-10-CM | POA: Diagnosis not present

## 2016-04-16 DIAGNOSIS — B029 Zoster without complications: Secondary | ICD-10-CM | POA: Diagnosis not present

## 2016-05-02 DIAGNOSIS — Z23 Encounter for immunization: Secondary | ICD-10-CM | POA: Diagnosis not present

## 2016-09-08 DIAGNOSIS — J988 Other specified respiratory disorders: Secondary | ICD-10-CM | POA: Diagnosis not present

## 2016-10-15 DIAGNOSIS — N3946 Mixed incontinence: Secondary | ICD-10-CM | POA: Diagnosis not present

## 2016-10-15 DIAGNOSIS — R109 Unspecified abdominal pain: Secondary | ICD-10-CM | POA: Diagnosis not present

## 2016-10-15 DIAGNOSIS — R351 Nocturia: Secondary | ICD-10-CM | POA: Diagnosis not present

## 2016-10-15 DIAGNOSIS — R350 Frequency of micturition: Secondary | ICD-10-CM | POA: Diagnosis not present

## 2016-11-27 DIAGNOSIS — N3946 Mixed incontinence: Secondary | ICD-10-CM | POA: Diagnosis not present

## 2016-11-27 DIAGNOSIS — R350 Frequency of micturition: Secondary | ICD-10-CM | POA: Diagnosis not present

## 2017-01-07 DIAGNOSIS — N3946 Mixed incontinence: Secondary | ICD-10-CM | POA: Diagnosis not present

## 2017-02-03 DIAGNOSIS — M8589 Other specified disorders of bone density and structure, multiple sites: Secondary | ICD-10-CM | POA: Diagnosis not present

## 2017-02-03 DIAGNOSIS — Z1231 Encounter for screening mammogram for malignant neoplasm of breast: Secondary | ICD-10-CM | POA: Diagnosis not present

## 2017-02-03 DIAGNOSIS — Z853 Personal history of malignant neoplasm of breast: Secondary | ICD-10-CM | POA: Diagnosis not present

## 2017-02-10 DIAGNOSIS — N3946 Mixed incontinence: Secondary | ICD-10-CM | POA: Diagnosis not present

## 2017-02-10 DIAGNOSIS — R350 Frequency of micturition: Secondary | ICD-10-CM | POA: Diagnosis not present

## 2017-02-20 DIAGNOSIS — E7800 Pure hypercholesterolemia, unspecified: Secondary | ICD-10-CM | POA: Diagnosis not present

## 2017-02-20 DIAGNOSIS — N951 Menopausal and female climacteric states: Secondary | ICD-10-CM | POA: Diagnosis not present

## 2017-02-20 DIAGNOSIS — G4700 Insomnia, unspecified: Secondary | ICD-10-CM | POA: Diagnosis not present

## 2017-02-20 DIAGNOSIS — G43009 Migraine without aura, not intractable, without status migrainosus: Secondary | ICD-10-CM | POA: Diagnosis not present

## 2017-03-05 DIAGNOSIS — M545 Low back pain: Secondary | ICD-10-CM | POA: Diagnosis not present

## 2017-05-02 DIAGNOSIS — Z23 Encounter for immunization: Secondary | ICD-10-CM | POA: Diagnosis not present

## 2017-10-05 DIAGNOSIS — H26493 Other secondary cataract, bilateral: Secondary | ICD-10-CM | POA: Diagnosis not present

## 2017-10-21 DIAGNOSIS — M545 Low back pain: Secondary | ICD-10-CM | POA: Diagnosis not present

## 2017-10-26 DIAGNOSIS — M545 Low back pain: Secondary | ICD-10-CM | POA: Diagnosis not present

## 2017-10-28 DIAGNOSIS — M545 Low back pain: Secondary | ICD-10-CM | POA: Diagnosis not present

## 2017-11-02 DIAGNOSIS — M545 Low back pain: Secondary | ICD-10-CM | POA: Diagnosis not present

## 2017-11-03 DIAGNOSIS — M545 Low back pain: Secondary | ICD-10-CM | POA: Diagnosis not present

## 2017-11-09 DIAGNOSIS — M545 Low back pain: Secondary | ICD-10-CM | POA: Diagnosis not present

## 2017-11-11 DIAGNOSIS — M545 Low back pain: Secondary | ICD-10-CM | POA: Diagnosis not present

## 2017-11-18 DIAGNOSIS — M545 Low back pain: Secondary | ICD-10-CM | POA: Diagnosis not present

## 2017-11-19 DIAGNOSIS — M545 Low back pain: Secondary | ICD-10-CM | POA: Diagnosis not present

## 2017-11-23 DIAGNOSIS — M545 Low back pain: Secondary | ICD-10-CM | POA: Diagnosis not present

## 2017-11-25 DIAGNOSIS — M545 Low back pain: Secondary | ICD-10-CM | POA: Diagnosis not present

## 2017-12-14 DIAGNOSIS — M545 Low back pain: Secondary | ICD-10-CM | POA: Diagnosis not present

## 2017-12-18 DIAGNOSIS — M545 Low back pain: Secondary | ICD-10-CM | POA: Diagnosis not present

## 2017-12-23 DIAGNOSIS — M545 Low back pain: Secondary | ICD-10-CM | POA: Diagnosis not present

## 2017-12-25 DIAGNOSIS — M545 Low back pain: Secondary | ICD-10-CM | POA: Diagnosis not present

## 2017-12-28 DIAGNOSIS — M545 Low back pain: Secondary | ICD-10-CM | POA: Diagnosis not present

## 2017-12-30 DIAGNOSIS — M545 Low back pain: Secondary | ICD-10-CM | POA: Diagnosis not present

## 2018-01-04 DIAGNOSIS — M545 Low back pain: Secondary | ICD-10-CM | POA: Diagnosis not present

## 2018-01-06 DIAGNOSIS — M545 Low back pain: Secondary | ICD-10-CM | POA: Diagnosis not present

## 2018-01-11 DIAGNOSIS — M545 Low back pain: Secondary | ICD-10-CM | POA: Diagnosis not present

## 2018-01-19 DIAGNOSIS — M545 Low back pain: Secondary | ICD-10-CM | POA: Diagnosis not present

## 2018-02-04 ENCOUNTER — Encounter: Payer: Self-pay | Admitting: Oncology

## 2018-02-04 DIAGNOSIS — Z853 Personal history of malignant neoplasm of breast: Secondary | ICD-10-CM | POA: Diagnosis not present

## 2018-02-04 DIAGNOSIS — Z1231 Encounter for screening mammogram for malignant neoplasm of breast: Secondary | ICD-10-CM | POA: Diagnosis not present

## 2018-02-04 DIAGNOSIS — Z803 Family history of malignant neoplasm of breast: Secondary | ICD-10-CM | POA: Diagnosis not present

## 2018-02-05 DIAGNOSIS — E7800 Pure hypercholesterolemia, unspecified: Secondary | ICD-10-CM | POA: Diagnosis not present

## 2018-02-05 DIAGNOSIS — Z1159 Encounter for screening for other viral diseases: Secondary | ICD-10-CM | POA: Diagnosis not present

## 2018-02-05 DIAGNOSIS — G4700 Insomnia, unspecified: Secondary | ICD-10-CM | POA: Diagnosis not present

## 2018-02-05 DIAGNOSIS — Z6826 Body mass index (BMI) 26.0-26.9, adult: Secondary | ICD-10-CM | POA: Diagnosis not present

## 2018-02-05 DIAGNOSIS — N183 Chronic kidney disease, stage 3 (moderate): Secondary | ICD-10-CM | POA: Diagnosis not present

## 2018-02-05 DIAGNOSIS — D649 Anemia, unspecified: Secondary | ICD-10-CM | POA: Diagnosis not present

## 2018-03-09 DIAGNOSIS — B351 Tinea unguium: Secondary | ICD-10-CM | POA: Diagnosis not present

## 2018-03-09 DIAGNOSIS — L03032 Cellulitis of left toe: Secondary | ICD-10-CM | POA: Diagnosis not present

## 2018-04-16 DIAGNOSIS — Z23 Encounter for immunization: Secondary | ICD-10-CM | POA: Diagnosis not present

## 2018-06-28 DIAGNOSIS — Z6826 Body mass index (BMI) 26.0-26.9, adult: Secondary | ICD-10-CM | POA: Diagnosis not present

## 2018-06-28 DIAGNOSIS — R233 Spontaneous ecchymoses: Secondary | ICD-10-CM | POA: Diagnosis not present

## 2018-07-29 DIAGNOSIS — N3946 Mixed incontinence: Secondary | ICD-10-CM | POA: Diagnosis not present

## 2018-07-29 DIAGNOSIS — R351 Nocturia: Secondary | ICD-10-CM | POA: Diagnosis not present

## 2018-12-01 DIAGNOSIS — G4700 Insomnia, unspecified: Secondary | ICD-10-CM | POA: Diagnosis not present

## 2019-02-07 DIAGNOSIS — R634 Abnormal weight loss: Secondary | ICD-10-CM | POA: Diagnosis not present

## 2019-02-07 DIAGNOSIS — M40204 Unspecified kyphosis, thoracic region: Secondary | ICD-10-CM | POA: Diagnosis not present

## 2019-02-07 DIAGNOSIS — Z96651 Presence of right artificial knee joint: Secondary | ICD-10-CM | POA: Diagnosis not present

## 2019-02-07 DIAGNOSIS — M8589 Other specified disorders of bone density and structure, multiple sites: Secondary | ICD-10-CM | POA: Diagnosis not present

## 2019-02-21 DIAGNOSIS — R413 Other amnesia: Secondary | ICD-10-CM | POA: Diagnosis not present

## 2019-02-21 DIAGNOSIS — M859 Disorder of bone density and structure, unspecified: Secondary | ICD-10-CM | POA: Diagnosis not present

## 2019-02-22 ENCOUNTER — Other Ambulatory Visit: Payer: Self-pay | Admitting: Family Medicine

## 2019-02-22 DIAGNOSIS — R413 Other amnesia: Secondary | ICD-10-CM

## 2019-03-08 DIAGNOSIS — R413 Other amnesia: Secondary | ICD-10-CM | POA: Diagnosis not present

## 2019-03-08 DIAGNOSIS — Z853 Personal history of malignant neoplasm of breast: Secondary | ICD-10-CM | POA: Diagnosis not present

## 2019-03-08 DIAGNOSIS — R2689 Other abnormalities of gait and mobility: Secondary | ICD-10-CM | POA: Diagnosis not present

## 2019-03-18 DIAGNOSIS — R1013 Epigastric pain: Secondary | ICD-10-CM | POA: Diagnosis not present

## 2019-03-18 DIAGNOSIS — Z23 Encounter for immunization: Secondary | ICD-10-CM | POA: Diagnosis not present

## 2019-03-18 DIAGNOSIS — Z6827 Body mass index (BMI) 27.0-27.9, adult: Secondary | ICD-10-CM | POA: Diagnosis not present

## 2019-03-18 DIAGNOSIS — R42 Dizziness and giddiness: Secondary | ICD-10-CM | POA: Diagnosis not present

## 2019-04-01 DIAGNOSIS — D649 Anemia, unspecified: Secondary | ICD-10-CM | POA: Diagnosis not present

## 2019-06-01 DIAGNOSIS — Z961 Presence of intraocular lens: Secondary | ICD-10-CM | POA: Diagnosis not present

## 2019-06-27 DIAGNOSIS — R413 Other amnesia: Secondary | ICD-10-CM | POA: Diagnosis not present

## 2019-06-27 DIAGNOSIS — T17308D Unspecified foreign body in larynx causing other injury, subsequent encounter: Secondary | ICD-10-CM | POA: Diagnosis not present

## 2019-06-27 DIAGNOSIS — Z862 Personal history of diseases of the blood and blood-forming organs and certain disorders involving the immune mechanism: Secondary | ICD-10-CM | POA: Diagnosis not present

## 2019-06-29 DIAGNOSIS — M545 Low back pain: Secondary | ICD-10-CM | POA: Diagnosis not present

## 2019-07-07 DIAGNOSIS — M419 Scoliosis, unspecified: Secondary | ICD-10-CM | POA: Diagnosis not present

## 2019-07-07 DIAGNOSIS — M6281 Muscle weakness (generalized): Secondary | ICD-10-CM | POA: Diagnosis not present

## 2019-07-12 DIAGNOSIS — M419 Scoliosis, unspecified: Secondary | ICD-10-CM | POA: Diagnosis not present

## 2019-07-12 DIAGNOSIS — M6281 Muscle weakness (generalized): Secondary | ICD-10-CM | POA: Diagnosis not present

## 2019-07-13 DIAGNOSIS — M6281 Muscle weakness (generalized): Secondary | ICD-10-CM | POA: Diagnosis not present

## 2019-07-13 DIAGNOSIS — M419 Scoliosis, unspecified: Secondary | ICD-10-CM | POA: Diagnosis not present

## 2019-07-14 DIAGNOSIS — D509 Iron deficiency anemia, unspecified: Secondary | ICD-10-CM | POA: Diagnosis not present

## 2019-07-14 DIAGNOSIS — K219 Gastro-esophageal reflux disease without esophagitis: Secondary | ICD-10-CM | POA: Diagnosis not present

## 2019-07-14 DIAGNOSIS — Z86010 Personal history of colonic polyps: Secondary | ICD-10-CM | POA: Diagnosis not present

## 2019-07-14 DIAGNOSIS — R1310 Dysphagia, unspecified: Secondary | ICD-10-CM | POA: Diagnosis not present

## 2019-07-19 DIAGNOSIS — M419 Scoliosis, unspecified: Secondary | ICD-10-CM | POA: Diagnosis not present

## 2019-07-19 DIAGNOSIS — M6281 Muscle weakness (generalized): Secondary | ICD-10-CM | POA: Diagnosis not present

## 2019-07-25 DIAGNOSIS — M419 Scoliosis, unspecified: Secondary | ICD-10-CM | POA: Diagnosis not present

## 2019-07-25 DIAGNOSIS — M6281 Muscle weakness (generalized): Secondary | ICD-10-CM | POA: Diagnosis not present

## 2019-07-27 DIAGNOSIS — M419 Scoliosis, unspecified: Secondary | ICD-10-CM | POA: Diagnosis not present

## 2019-07-27 DIAGNOSIS — M6281 Muscle weakness (generalized): Secondary | ICD-10-CM | POA: Diagnosis not present

## 2019-07-28 DIAGNOSIS — Z5309 Procedure and treatment not carried out because of other contraindication: Secondary | ICD-10-CM | POA: Diagnosis not present

## 2019-07-28 DIAGNOSIS — R1310 Dysphagia, unspecified: Secondary | ICD-10-CM | POA: Diagnosis not present

## 2019-08-02 DIAGNOSIS — M419 Scoliosis, unspecified: Secondary | ICD-10-CM | POA: Diagnosis not present

## 2019-08-02 DIAGNOSIS — M6281 Muscle weakness (generalized): Secondary | ICD-10-CM | POA: Diagnosis not present

## 2019-08-04 DIAGNOSIS — M6281 Muscle weakness (generalized): Secondary | ICD-10-CM | POA: Diagnosis not present

## 2019-08-04 DIAGNOSIS — M419 Scoliosis, unspecified: Secondary | ICD-10-CM | POA: Diagnosis not present

## 2019-08-09 ENCOUNTER — Other Ambulatory Visit: Payer: Self-pay | Admitting: Internal Medicine

## 2019-08-09 DIAGNOSIS — M6281 Muscle weakness (generalized): Secondary | ICD-10-CM | POA: Diagnosis not present

## 2019-08-09 DIAGNOSIS — M419 Scoliosis, unspecified: Secondary | ICD-10-CM | POA: Diagnosis not present

## 2019-08-09 DIAGNOSIS — R1310 Dysphagia, unspecified: Secondary | ICD-10-CM

## 2019-08-11 DIAGNOSIS — M6281 Muscle weakness (generalized): Secondary | ICD-10-CM | POA: Diagnosis not present

## 2019-08-11 DIAGNOSIS — M419 Scoliosis, unspecified: Secondary | ICD-10-CM | POA: Diagnosis not present

## 2019-08-16 DIAGNOSIS — M419 Scoliosis, unspecified: Secondary | ICD-10-CM | POA: Diagnosis not present

## 2019-08-16 DIAGNOSIS — M6281 Muscle weakness (generalized): Secondary | ICD-10-CM | POA: Diagnosis not present

## 2019-08-18 DIAGNOSIS — M419 Scoliosis, unspecified: Secondary | ICD-10-CM | POA: Diagnosis not present

## 2019-08-18 DIAGNOSIS — M6281 Muscle weakness (generalized): Secondary | ICD-10-CM | POA: Diagnosis not present

## 2019-08-19 ENCOUNTER — Ambulatory Visit
Admission: RE | Admit: 2019-08-19 | Discharge: 2019-08-19 | Disposition: A | Discharge status: Home / Self Care | Payer: Medicare Other | Source: Ambulatory Visit | Attending: Internal Medicine | Admitting: Internal Medicine

## 2019-08-19 DIAGNOSIS — R1310 Dysphagia, unspecified: Secondary | ICD-10-CM

## 2019-08-19 DIAGNOSIS — K449 Diaphragmatic hernia without obstruction or gangrene: Secondary | ICD-10-CM | POA: Diagnosis not present

## 2019-08-23 DIAGNOSIS — M6281 Muscle weakness (generalized): Secondary | ICD-10-CM | POA: Diagnosis not present

## 2019-08-23 DIAGNOSIS — M419 Scoliosis, unspecified: Secondary | ICD-10-CM | POA: Diagnosis not present

## 2019-08-24 DIAGNOSIS — Z20828 Contact with and (suspected) exposure to other viral communicable diseases: Secondary | ICD-10-CM | POA: Diagnosis not present

## 2019-08-24 DIAGNOSIS — Z20822 Contact with and (suspected) exposure to covid-19: Secondary | ICD-10-CM | POA: Diagnosis not present

## 2019-08-25 DIAGNOSIS — M6281 Muscle weakness (generalized): Secondary | ICD-10-CM | POA: Diagnosis not present

## 2019-08-25 DIAGNOSIS — M419 Scoliosis, unspecified: Secondary | ICD-10-CM | POA: Diagnosis not present

## 2019-08-30 DIAGNOSIS — R1310 Dysphagia, unspecified: Secondary | ICD-10-CM | POA: Diagnosis not present

## 2019-08-30 DIAGNOSIS — K449 Diaphragmatic hernia without obstruction or gangrene: Secondary | ICD-10-CM | POA: Diagnosis not present

## 2019-08-30 DIAGNOSIS — K317 Polyp of stomach and duodenum: Secondary | ICD-10-CM | POA: Diagnosis not present

## 2019-08-30 DIAGNOSIS — K2950 Unspecified chronic gastritis without bleeding: Secondary | ICD-10-CM | POA: Diagnosis not present

## 2019-08-30 DIAGNOSIS — R1319 Other dysphagia: Secondary | ICD-10-CM | POA: Diagnosis not present

## 2019-08-30 DIAGNOSIS — K222 Esophageal obstruction: Secondary | ICD-10-CM | POA: Diagnosis not present

## 2019-09-01 DIAGNOSIS — M419 Scoliosis, unspecified: Secondary | ICD-10-CM | POA: Diagnosis not present

## 2019-09-01 DIAGNOSIS — M6281 Muscle weakness (generalized): Secondary | ICD-10-CM | POA: Diagnosis not present

## 2019-09-06 DIAGNOSIS — M419 Scoliosis, unspecified: Secondary | ICD-10-CM | POA: Diagnosis not present

## 2019-09-06 DIAGNOSIS — M6281 Muscle weakness (generalized): Secondary | ICD-10-CM | POA: Diagnosis not present

## 2019-09-08 DIAGNOSIS — M6281 Muscle weakness (generalized): Secondary | ICD-10-CM | POA: Diagnosis not present

## 2019-09-08 DIAGNOSIS — M419 Scoliosis, unspecified: Secondary | ICD-10-CM | POA: Diagnosis not present

## 2019-09-13 DIAGNOSIS — M419 Scoliosis, unspecified: Secondary | ICD-10-CM | POA: Diagnosis not present

## 2019-09-13 DIAGNOSIS — M6281 Muscle weakness (generalized): Secondary | ICD-10-CM | POA: Diagnosis not present

## 2019-09-20 DIAGNOSIS — M419 Scoliosis, unspecified: Secondary | ICD-10-CM | POA: Diagnosis not present

## 2019-09-20 DIAGNOSIS — M6281 Muscle weakness (generalized): Secondary | ICD-10-CM | POA: Diagnosis not present

## 2019-09-22 DIAGNOSIS — M6281 Muscle weakness (generalized): Secondary | ICD-10-CM | POA: Diagnosis not present

## 2019-09-22 DIAGNOSIS — M419 Scoliosis, unspecified: Secondary | ICD-10-CM | POA: Diagnosis not present

## 2019-09-27 DIAGNOSIS — M419 Scoliosis, unspecified: Secondary | ICD-10-CM | POA: Diagnosis not present

## 2019-09-27 DIAGNOSIS — M6281 Muscle weakness (generalized): Secondary | ICD-10-CM | POA: Diagnosis not present

## 2019-09-29 DIAGNOSIS — M419 Scoliosis, unspecified: Secondary | ICD-10-CM | POA: Diagnosis not present

## 2019-09-29 DIAGNOSIS — M6281 Muscle weakness (generalized): Secondary | ICD-10-CM | POA: Diagnosis not present

## 2019-10-04 DIAGNOSIS — M419 Scoliosis, unspecified: Secondary | ICD-10-CM | POA: Diagnosis not present

## 2019-10-04 DIAGNOSIS — M6281 Muscle weakness (generalized): Secondary | ICD-10-CM | POA: Diagnosis not present

## 2019-10-06 DIAGNOSIS — M6281 Muscle weakness (generalized): Secondary | ICD-10-CM | POA: Diagnosis not present

## 2019-10-06 DIAGNOSIS — M419 Scoliosis, unspecified: Secondary | ICD-10-CM | POA: Diagnosis not present

## 2019-10-13 DIAGNOSIS — M6281 Muscle weakness (generalized): Secondary | ICD-10-CM | POA: Diagnosis not present

## 2019-10-13 DIAGNOSIS — M419 Scoliosis, unspecified: Secondary | ICD-10-CM | POA: Diagnosis not present

## 2019-10-18 DIAGNOSIS — M6281 Muscle weakness (generalized): Secondary | ICD-10-CM | POA: Diagnosis not present

## 2019-10-18 DIAGNOSIS — M419 Scoliosis, unspecified: Secondary | ICD-10-CM | POA: Diagnosis not present

## 2019-10-20 DIAGNOSIS — M419 Scoliosis, unspecified: Secondary | ICD-10-CM | POA: Diagnosis not present

## 2019-10-20 DIAGNOSIS — M6281 Muscle weakness (generalized): Secondary | ICD-10-CM | POA: Diagnosis not present

## 2019-10-28 DIAGNOSIS — R1310 Dysphagia, unspecified: Secondary | ICD-10-CM | POA: Diagnosis not present

## 2019-10-28 DIAGNOSIS — K223 Perforation of esophagus: Secondary | ICD-10-CM | POA: Diagnosis not present

## 2019-10-28 DIAGNOSIS — Z20822 Contact with and (suspected) exposure to covid-19: Secondary | ICD-10-CM | POA: Diagnosis not present

## 2019-10-28 DIAGNOSIS — K317 Polyp of stomach and duodenum: Secondary | ICD-10-CM | POA: Diagnosis not present

## 2019-10-28 DIAGNOSIS — K228 Other specified diseases of esophagus: Secondary | ICD-10-CM | POA: Diagnosis not present

## 2019-10-28 DIAGNOSIS — K222 Esophageal obstruction: Secondary | ICD-10-CM | POA: Diagnosis not present

## 2019-10-28 DIAGNOSIS — K449 Diaphragmatic hernia without obstruction or gangrene: Secondary | ICD-10-CM | POA: Diagnosis not present

## 2019-11-01 DIAGNOSIS — M419 Scoliosis, unspecified: Secondary | ICD-10-CM | POA: Diagnosis not present

## 2019-11-01 DIAGNOSIS — M6281 Muscle weakness (generalized): Secondary | ICD-10-CM | POA: Diagnosis not present

## 2019-11-03 DIAGNOSIS — M419 Scoliosis, unspecified: Secondary | ICD-10-CM | POA: Diagnosis not present

## 2019-11-03 DIAGNOSIS — M6281 Muscle weakness (generalized): Secondary | ICD-10-CM | POA: Diagnosis not present

## 2019-11-08 DIAGNOSIS — M419 Scoliosis, unspecified: Secondary | ICD-10-CM | POA: Diagnosis not present

## 2019-11-08 DIAGNOSIS — M6281 Muscle weakness (generalized): Secondary | ICD-10-CM | POA: Diagnosis not present

## 2019-11-10 DIAGNOSIS — M419 Scoliosis, unspecified: Secondary | ICD-10-CM | POA: Diagnosis not present

## 2019-11-10 DIAGNOSIS — M6281 Muscle weakness (generalized): Secondary | ICD-10-CM | POA: Diagnosis not present

## 2019-11-17 DIAGNOSIS — M419 Scoliosis, unspecified: Secondary | ICD-10-CM | POA: Diagnosis not present

## 2019-11-17 DIAGNOSIS — M6281 Muscle weakness (generalized): Secondary | ICD-10-CM | POA: Diagnosis not present

## 2019-11-22 DIAGNOSIS — M6281 Muscle weakness (generalized): Secondary | ICD-10-CM | POA: Diagnosis not present

## 2019-11-22 DIAGNOSIS — M419 Scoliosis, unspecified: Secondary | ICD-10-CM | POA: Diagnosis not present

## 2019-11-24 DIAGNOSIS — M6281 Muscle weakness (generalized): Secondary | ICD-10-CM | POA: Diagnosis not present

## 2019-11-24 DIAGNOSIS — M419 Scoliosis, unspecified: Secondary | ICD-10-CM | POA: Diagnosis not present

## 2019-12-15 DIAGNOSIS — R350 Frequency of micturition: Secondary | ICD-10-CM | POA: Diagnosis not present

## 2019-12-15 DIAGNOSIS — N3946 Mixed incontinence: Secondary | ICD-10-CM | POA: Diagnosis not present

## 2020-01-20 DIAGNOSIS — R413 Other amnesia: Secondary | ICD-10-CM | POA: Diagnosis not present

## 2020-01-20 DIAGNOSIS — N183 Chronic kidney disease, stage 3 unspecified: Secondary | ICD-10-CM | POA: Diagnosis not present

## 2020-01-20 DIAGNOSIS — Z862 Personal history of diseases of the blood and blood-forming organs and certain disorders involving the immune mechanism: Secondary | ICD-10-CM | POA: Diagnosis not present

## 2020-01-20 DIAGNOSIS — Z6826 Body mass index (BMI) 26.0-26.9, adult: Secondary | ICD-10-CM | POA: Diagnosis not present

## 2020-02-09 DIAGNOSIS — Z1231 Encounter for screening mammogram for malignant neoplasm of breast: Secondary | ICD-10-CM | POA: Diagnosis not present

## 2020-02-10 DIAGNOSIS — K449 Diaphragmatic hernia without obstruction or gangrene: Secondary | ICD-10-CM | POA: Diagnosis not present

## 2020-02-10 DIAGNOSIS — K222 Esophageal obstruction: Secondary | ICD-10-CM | POA: Diagnosis not present

## 2020-02-10 DIAGNOSIS — K317 Polyp of stomach and duodenum: Secondary | ICD-10-CM | POA: Diagnosis not present

## 2020-04-18 ENCOUNTER — Encounter: Payer: Self-pay | Admitting: *Deleted

## 2020-04-20 DIAGNOSIS — Z6826 Body mass index (BMI) 26.0-26.9, adult: Secondary | ICD-10-CM | POA: Diagnosis not present

## 2020-04-20 DIAGNOSIS — Z23 Encounter for immunization: Secondary | ICD-10-CM | POA: Diagnosis not present

## 2020-04-20 DIAGNOSIS — G4700 Insomnia, unspecified: Secondary | ICD-10-CM | POA: Diagnosis not present

## 2020-04-20 DIAGNOSIS — Z0000 Encounter for general adult medical examination without abnormal findings: Secondary | ICD-10-CM | POA: Diagnosis not present

## 2020-04-24 ENCOUNTER — Other Ambulatory Visit: Payer: Self-pay

## 2020-04-24 ENCOUNTER — Ambulatory Visit: Payer: Medicare Other | Admitting: Diagnostic Neuroimaging

## 2020-04-24 ENCOUNTER — Encounter: Payer: Self-pay | Admitting: *Deleted

## 2020-04-24 VITALS — BP 145/89 | HR 89 | Ht 63.0 in | Wt 145.8 lb

## 2020-04-24 DIAGNOSIS — R413 Other amnesia: Secondary | ICD-10-CM

## 2020-04-24 DIAGNOSIS — G3184 Mild cognitive impairment, so stated: Secondary | ICD-10-CM

## 2020-04-24 NOTE — Patient Instructions (Signed)
MILD MEMORY LOSS - could not tolerate donepezil / memantine - safety / supervision issues reviewed - daily physical activity / exercise (at least 15-30 minutes) - eat more plants / vegetables - increase social activities, brain stimulation, games, puzzles, hobbies, crafts, arts, music - aim for at least 7-8 hours sleep per night (or more) - avoid smoking and alcohol - caregiver resources provided - caution with driving and finances

## 2020-04-24 NOTE — Progress Notes (Signed)
[facility]  PATIENT: Samantha Ali DOB: 11/23/1945  REFERRING CLINICIAN: Kathyrn Lass, MD HISTORY FROM: patient  REASON FOR VISIT: new consult    HISTORICAL  CHIEF COMPLAINT:  Chief Complaint  Patient presents with  . Dementia    rm 7 New Pt, husbandHerbie Baltimore MMSE 18    HISTORY OF PRESENT ILLNESS:   74 year old female here for evaluation memory loss. Patient has had 6 months of mild short-term memory loss, losing things, forgetting recent conversations. No major changes in ADLs. She takes care of her own personal hygiene, bathing, dressing, feeding, household chores. She still able to drive, shop, manage finances and operate independently outside the home. She was empirically tried on donepezil and memantine but could not tolerate these because of side effects.   REVIEW OF SYSTEMS: Full 14 system review of systems performed and negative with exception of: As per HPI.  ALLERGIES: Allergies  Allergen Reactions  . Aricept [Donepezil]     Bad dreams    HOME MEDICATIONS: Outpatient Medications Prior to Visit  Medication Sig Dispense Refill  . acetaminophen (TYLENOL) 500 MG tablet Take by mouth. 750 mg twice daily    . aspirin EC 81 MG tablet Take 81 mg by mouth daily.    . Cholecalciferol (VITAMIN D3) 1000 UNITS CAPS Take 1 tablet by mouth 2 (two) times daily.     Marland Kitchen esomeprazole (NEXIUM) 40 MG capsule Take 40 mg by mouth daily.    . Magnesium 250 MG TABS Take 250 mg by mouth 2 (two) times daily.    Marland Kitchen OVER THE COUNTER MEDICATION Take 3 tablets by mouth at bedtime. Hyland's Leg Cramps PM    . traZODone (DESYREL) 50 MG tablet Take 25 mg by mouth at bedtime.     Marland Kitchen esomeprazole (NEXIUM) 40 MG capsule Take by mouth.    Marland Kitchen atorvastatin (LIPITOR) 10 MG tablet Take 10 mg by mouth daily after supper.  (Patient not taking: Reported on 04/24/2020)    . cloNIDine (CATAPRES) 0.2 MG tablet Take 0.2 mg by mouth 2 (two) times daily. Takes 1/2 tablet in the am and 1 tablet in  the pm......takes for hot flashes (Patient not taking: Reported on 04/24/2020)    . HYDROcodone-acetaminophen (NORCO/VICODIN) 5-325 MG per tablet Take 1-2 tablets by mouth every 6 (six) hours as needed for moderate pain. (Patient not taking: Reported on 04/24/2020) 70 tablet 0  . Omega 3-6-9 Fatty Acids (OMEGA-3-6-9 PO) Take 5 mLs by mouth 2 (two) times daily as needed.  (Patient not taking: Reported on 04/24/2020)    . TOVIAZ 8 MG TB24 tablet Take 8 mg by mouth daily.    . traMADol (ULTRAM) 50 MG tablet Take 50 mg by mouth every 6 (six) hours as needed for moderate pain.  (Patient not taking: Reported on 04/24/2020)    . venlafaxine (EFFEXOR) 75 MG tablet Take 1 tablet (75 mg total) by mouth daily. (Patient not taking: Reported on 04/24/2020) 90 tablet 3  . omeprazole (PRILOSEC) 20 MG capsule Take 20 mg by mouth 2 (two) times daily before a meal.  (Patient not taking: Reported on 04/24/2020)     No facility-administered medications prior to visit.    PAST MEDICAL HISTORY: Past Medical History:  Diagnosis Date  . Arthritis    R knee  . Breast cancer ([place]) 2004   chemo  . Cancer ([place])   . CKD (chronic kidney disease)   . Clot 1996   in the right leg- in the muscle    .  DVT (deep venous thrombosis) ([place]) 1990's   probably related to positive phlebitis  . Factor 5 Leiden mutation, heterozygous ([place]) 11/21/2013  . GERD (gastroesophageal reflux disease)   . Headache(784.0)    migraines- most often 2-3 times /week   . Heterozygous factor V Leiden mutation ([place]) 01/16/2014  . Hyperlipidemia   . Insomnia   . Migraine   . Osteoporosis   . Photophobia of both eyes    unknown etiology    PAST SURGICAL HISTORY: Past Surgical History:  Procedure Laterality Date  . BREAST LUMPECTOMY  02/21/2003  . BREAST SURGERY  1987   right lumpectomy for benign disease  . EYE SURGERY     cataracts removed -IOL  . KNEE SURGERY    . MASTECTOMY Left 2004   L parial mastectomy  . SENTINEL LYMPH NODE BIOPSY   02/21/2003  . SHOULDER SURGERY Right 03/2010   rotator cuff  . TONSILLECTOMY    . TOTAL KNEE ARTHROPLASTY Right 08/09/2013   Procedure: TOTAL KNEE ARTHROPLASTY;  Surgeon: Hessie Dibble, MD;  Location: [facility];  Service: Orthopedics;  Laterality: Right;  . TUBAL LIGATION      FAMILY HISTORY: Family History  Problem Relation Age of Onset  . Heart disease Father     SOCIAL HISTORY: Social History   Socioeconomic History  . Marital status: Married    Spouse name: Herbie Baltimore  . Number of children: 1  . Years of education: 17  . Highest education level: Not on file  Occupational History  . Occupation: collections  Tobacco Use  . Smoking status: Never Smoker  . Smokeless tobacco: Never Used  Substance and Sexual Activity  . Alcohol use: Never    Alcohol/week: 0.0 standard drinks  . Drug use: Never  . Sexual activity: Not on file  Other Topics Concern  . Not on file  Social History Narrative   Lives with husband   No caffiene   Social Determinants of Health   Financial Resource Strain:   . Difficulty of Paying Living Expenses: Not on file  Food Insecurity:   . Worried About Charity fundraiser in the Last Year: Not on file  . Ran Out of Food in the Last Year: Not on file  Transportation Needs:   . Lack of Transportation (Medical): Not on file  . Lack of Transportation (Non-Medical): Not on file  Physical Activity:   . Days of Exercise per Week: Not on file  . Minutes of Exercise per Session: Not on file  Stress:   . Feeling of Stress : Not on file  Social Connections:   . Frequency of Communication with Friends and Family: Not on file  . Frequency of Social Gatherings with Friends and Family: Not on file  . Attends Religious Services: Not on file  . Active Member of Clubs or Organizations: Not on file  . Attends Archivist Meetings: Not on file  . Marital Status: Not on file  Intimate Partner Violence:   . Fear of Current or Ex-Partner: Not on file  .  Emotionally Abused: Not on file  . Physically Abused: Not on file  . Sexually Abused: Not on file     PHYSICAL EXAM  GENERAL EXAM/CONSTITUTIONAL: Vitals:  Vitals:   04/24/20 1453  BP: (!) 145/89  Pulse: 89  Weight: 145 lb 12.8 oz (66.1 kg)  Height: 5\' 3"  (1.6 m)     Body mass index is 25.83 kg/m. Wt Readings from Last 3 Encounters:  04/24/20 145  lb 12.8 oz (66.1 kg)  06/27/14 169 lb 1.6 oz (76.7 kg)  04/24/14 164 lb 11.2 oz (74.7 kg)     Patient is in no distress; well developed, nourished and groomed; neck is supple  CARDIOVASCULAR:  Examination of carotid arteries is normal; no carotid bruits  Regular rate and rhythm, no murmurs  Examination of peripheral vascular system by observation and palpation is normal  EYES:  Ophthalmoscopic exam of optic discs and posterior segments is normal; no papilledema or hemorrhages  No exam data present  MUSCULOSKELETAL:  Gait, strength, tone, movements noted in Neurologic exam below  NEUROLOGIC: MENTAL STATUS:  MMSE - [place] Exam 04/24/2020  Orientation to time 3  Orientation to Place 4  Registration 3  Attention/ Calculation 1  Recall 0  Language- name 2 objects 2  Language- repeat 0  Language- follow 3 step command 3  Language- read & follow direction 1  Write a sentence 1  Copy design 0  Total score 18    awake, alert, oriented to person, place and time  recent and remote memory intact  normal attention and concentration  language fluent, comprehension intact, naming intact  fund of knowledge appropriate  CRANIAL NERVE:   2nd - no papilledema on fundoscopic exam  2nd, 3rd, 4th, 6th - pupils equal and reactive to light, visual fields full to confrontation, extraocular muscles intact, no nystagmus  5th - facial sensation symmetric  7th - facial strength symmetric  8th - hearing intact  9th - palate elevates symmetrically, uvula midline  11th - shoulder shrug symmetric  12th -  tongue protrusion midline  MOTOR:   normal bulk and tone, full strength in the BUE, BLE  SENSORY:   normal and symmetric to light touch, temperature, vibration  COORDINATION:   finger-nose-finger, fine finger movements normal  REFLEXES:   deep tendon reflexes present and symmetric  GAIT/STATION:   narrow based gait     DIAGNOSTIC DATA (LABS, IMAGING, TESTING) - I reviewed patient records, labs, notes, testing and imaging myself where available.  Lab Results  Component Value Date   WBC 6.3 06/07/2014   HGB 11.7 (L) 06/07/2014   HCT 36.2 06/07/2014   MCV 90.0 06/07/2014   PLT 278 06/07/2014      Component Value Date/Time   NA 143 11/15/2013 0630   NA 143 05/30/2013 1040   K 3.8 11/15/2013 0630   K 4.6 05/30/2013 1040   CL 103 11/15/2013 0630   CL 105 05/05/2012 1058   CO2 27 11/15/2013 0630   CO2 27 05/30/2013 1040   GLUCOSE 91 11/15/2013 0630   GLUCOSE 98 05/30/2013 1040   GLUCOSE 93 05/05/2012 1058   BUN 13 11/15/2013 0630   BUN 14.4 05/30/2013 1040   CREATININE 0.92 11/15/2013 0630   CREATININE 1.1 05/30/2013 1040   CALCIUM 9.6 11/15/2013 0630   CALCIUM 10.1 05/30/2013 1040   PROT 7.5 11/15/2013 0630   PROT 7.7 05/30/2013 1040   ALBUMIN 3.9 11/15/2013 0630   ALBUMIN 3.8 05/30/2013 1040   AST 24 11/15/2013 0630   AST 26 05/30/2013 1040   ALT 22 11/15/2013 0630   ALT 22 05/30/2013 1040   ALKPHOS 101 11/15/2013 0630   ALKPHOS 88 05/30/2013 1040   BILITOT 0.4 11/15/2013 0630   BILITOT 0.59 05/30/2013 1040   GFRNONAA 63 (L) 11/15/2013 0630   GFRAA 72 (L) 11/15/2013 0630   Lab Results  Component Value Date   CHOL 150 05/27/2010   HDL 50 05/27/2010  LDLCALC 87 05/27/2010   TRIG 63 05/27/2010   CHOLHDL 3.0 05/27/2010   No results found for: HGBA1C Lab Results  Component Value Date   VITAMINB12 651 11/15/2013   No results found for: TSH   03/08/19 MRI brain - Mild biparietal atrophy.  - Otherwise normal.      ASSESSMENT AND  PLAN  74 y.o. year old female here with mild memory loss since early 2021. No major changes in ADLs. Could represent mild cognitive impairment. MMSE is 18 out of 30, which is lower than expected given her lack of change in ADLs, could be related to effort or other factors.  Dx:  1. Memory loss   2. MCI (mild cognitive impairment)      PLAN:  MILD MEMORY LOSS (MMSE 18/30; no major changes in ADLs; possible MCI vs mild prodromal dementia) - could not tolerate donepezil / memantine; no additional medications recommended at this time - safety / supervision issues reviewed - daily physical activity / exercise (at least 15-30 minutes) - eat more plants / vegetables - increase social activities, brain stimulation, games, puzzles, hobbies, crafts, arts, music - aim for at least 7-8 hours sleep per night (or more) - avoid smoking and alcohol - caregiver resources provided - caution with driving and finances  Return for return to PCP, pending if symptoms worsen or fail to improve.    Penni Bombard, MD 0/92/9574, 7:34 PM Certified in Neurology, Neurophysiology and Neuroimaging  [facility] [address], [place], [place] [address] [phone]

## 2020-05-26 ENCOUNTER — Ambulatory Visit: Payer: Medicare Other | Attending: Internal Medicine

## 2020-05-26 DIAGNOSIS — Z23 Encounter for immunization: Secondary | ICD-10-CM

## 2020-05-26 NOTE — Progress Notes (Signed)
   Covid-19 Vaccination Clinic  Name:  Samantha Ali    MRN: [id number] DOB: 10-14-1945  05/26/2020  Samantha Ali was observed post Covid-19 immunization for 15 minutes without incident. She was provided with Vaccine Information Sheet and instruction to access the V-Safe system.   Samantha Ali was instructed to call 911 with any severe reactions post vaccine: Marland Kitchen Difficulty breathing  . Swelling of face and throat  . A fast heartbeat  . A bad rash all over body  . Dizziness and weakness

## 2020-08-24 DIAGNOSIS — K222 Esophageal obstruction: Secondary | ICD-10-CM | POA: Diagnosis not present

## 2020-08-24 DIAGNOSIS — R1310 Dysphagia, unspecified: Secondary | ICD-10-CM | POA: Diagnosis not present

## 2020-08-24 DIAGNOSIS — K317 Polyp of stomach and duodenum: Secondary | ICD-10-CM | POA: Diagnosis not present

## 2020-08-24 DIAGNOSIS — K449 Diaphragmatic hernia without obstruction or gangrene: Secondary | ICD-10-CM | POA: Diagnosis not present

## 2020-10-01 ENCOUNTER — Encounter (HOSPITAL_COMMUNITY): Payer: Self-pay | Admitting: Emergency Medicine

## 2020-10-01 ENCOUNTER — Other Ambulatory Visit: Payer: Self-pay

## 2020-10-01 ENCOUNTER — Emergency Department (HOSPITAL_COMMUNITY)
Admission: EM | Admit: 2020-10-01 | Discharge: 2020-10-01 | Disposition: A | Discharge status: Home / Self Care | Payer: Medicare Other | Attending: Emergency Medicine | Admitting: Emergency Medicine

## 2020-10-01 DIAGNOSIS — N189 Chronic kidney disease, unspecified: Secondary | ICD-10-CM | POA: Diagnosis not present

## 2020-10-01 DIAGNOSIS — Z7982 Long term (current) use of aspirin: Secondary | ICD-10-CM | POA: Diagnosis not present

## 2020-10-01 DIAGNOSIS — Z96651 Presence of right artificial knee joint: Secondary | ICD-10-CM | POA: Insufficient documentation

## 2020-10-01 DIAGNOSIS — Z853 Personal history of malignant neoplasm of breast: Secondary | ICD-10-CM | POA: Diagnosis not present

## 2020-10-01 DIAGNOSIS — J029 Acute pharyngitis, unspecified: Secondary | ICD-10-CM

## 2020-10-01 DIAGNOSIS — Z20822 Contact with and (suspected) exposure to covid-19: Secondary | ICD-10-CM | POA: Insufficient documentation

## 2020-10-01 DIAGNOSIS — R070 Pain in throat: Secondary | ICD-10-CM | POA: Diagnosis present

## 2020-10-01 DIAGNOSIS — R0602 Shortness of breath: Secondary | ICD-10-CM | POA: Diagnosis not present

## 2020-10-01 LAB — GROUP A STREP BY PCR: Group A Strep by PCR: NOT DETECTED

## 2020-10-01 LAB — SARS CORONAVIRUS 2 (TAT 6-24 HRS): SARS Coronavirus 2: NEGATIVE

## 2020-10-01 MED ORDER — DEXAMETHASONE 4 MG PO TABS
6.0000 mg | ORAL_TABLET | Freq: Once | ORAL | Status: AC
  Administered 2020-10-01: 6 mg via ORAL
  Filled 2020-10-01: qty 2

## 2020-10-01 MED ORDER — ACETAMINOPHEN 325 MG PO TABS
650.0000 mg | ORAL_TABLET | Freq: Once | ORAL | Status: AC
  Administered 2020-10-01: 650 mg via ORAL
  Filled 2020-10-01: qty 2

## 2020-10-01 NOTE — Discharge Instructions (Addendum)
Call your primary care doctor or specialist as discussed in the next 2-3 days.   Return immediately back to the ER if:  Your symptoms worsen within the next 12-24 hours. You develop new symptoms such as new fevers, persistent vomiting, new pain, shortness of breath, or new weakness or numbness, or if you have any other concerns.  

## 2020-10-01 NOTE — ED Triage Notes (Signed)
Patient reports SOB with sore throat and dry cough this morning . No fever or chills .

## 2020-10-01 NOTE — ED Provider Notes (Signed)
[facility] EMERGENCY DEPARTMENT Provider Note   CSN: [id number] Arrival date & time: 10/01/20  1157     History Chief Complaint  Patient presents with  . SOB/Sore Throat    Samantha Ali is a 75 y.o. female.  Patient presents to ER chief complaint of sore throat and sensation of shortness of breath.  She states that she went to bed feeling fine and woke up in the middle night sore throat.  Denies any fevers.  Complaining of mild cough as well.  No vomiting or diarrhea.        Past Medical History:  Diagnosis Date  . Arthritis    R knee  . Breast cancer ([place]) 2004   chemo  . Cancer ([place])   . CKD (chronic kidney disease)   . Clot 1996   in the right leg- in the muscle    . DVT (deep venous thrombosis) ([place]) 1990's   probably related to positive phlebitis  . Factor 5 Leiden mutation, heterozygous ([place]) 11/21/2013  . GERD (gastroesophageal reflux disease)   . Headache(784.0)    migraines- most often 2-3 times /week   . Heterozygous factor V Leiden mutation ([place]) 01/16/2014  . Hyperlipidemia   . Insomnia   . Migraine   . Osteoporosis   . Photophobia of both eyes    unknown etiology    Patient Active Problem List   Diagnosis Date Noted  . Heterozygous factor V Leiden mutation ([place]) 01/16/2014  . DVT (deep venous thrombosis) ([place]) 11/15/2013  . Total knee replacement status 08/09/2013  . Chest pain 08/04/2012  . Palpitations 08/04/2012  . Septic joint of right knee joint ([place]) 06/01/2011  . Breast cancer ([place]) 05/29/2011    Past Surgical History:  Procedure Laterality Date  . BREAST LUMPECTOMY  02/21/2003  . BREAST SURGERY  1987   right lumpectomy for benign disease  . EYE SURGERY     cataracts removed -IOL  . KNEE SURGERY    . MASTECTOMY Left 2004   L parial mastectomy  . SENTINEL LYMPH NODE BIOPSY  02/21/2003  . SHOULDER SURGERY Right 03/2010   rotator cuff  . TONSILLECTOMY    . TOTAL KNEE ARTHROPLASTY Right 08/09/2013   Procedure:  TOTAL KNEE ARTHROPLASTY;  Surgeon: Hessie Dibble, MD;  Location: [facility];  Service: Orthopedics;  Laterality: Right;  . TUBAL LIGATION       OB History   No obstetric history on file.     Family History  Problem Relation Age of Onset  . Heart disease Father     Social History   Tobacco Use  . Smoking status: Never Smoker  . Smokeless tobacco: Never Used  Substance Use Topics  . Alcohol use: Never    Alcohol/week: 0.0 standard drinks  . Drug use: Never    Home Medications Prior to Admission medications   Medication Sig Start Date End Date Taking? Authorizing Provider  acetaminophen (TYLENOL) 500 MG tablet Take by mouth. 750 mg twice daily    [provider]  aspirin EC 81 MG tablet Take 81 mg by mouth daily.    [provider]  Cholecalciferol (VITAMIN D3) 1000 UNITS CAPS Take 1 tablet by mouth 2 (two) times daily.     [provider]  esomeprazole (NEXIUM) 40 MG capsule Take 40 mg by mouth daily. 03/22/20   [provider]  Magnesium 250 MG TABS Take 250 mg by mouth 2 (two) times daily.    [provider]  OVER THE COUNTER MEDICATION Take 3 tablets by mouth at bedtime. Hyland's Leg Cramps PM 03/31/13   [provider]  TOVIAZ 8 MG TB24 tablet Take 8 mg by mouth daily. 04/04/20   [provider]  traZODone (DESYREL) 50 MG tablet Take 25 mg by mouth at bedtime.         Allergies    Aricept [donepezil]  Review of Systems   Review of Systems  Constitutional: Negative for fever.  HENT: Negative for ear pain.   Eyes: Negative for pain.  Respiratory: Negative for cough.   Cardiovascular: Negative for chest pain.  Gastrointestinal: Negative for abdominal pain.  Genitourinary: Negative for flank pain.  Musculoskeletal: Negative for back pain.  Skin: Negative for rash.  Neurological: Negative for headaches.    Physical Exam Updated Vital Signs BP (!) 181/74   Pulse 99   Temp 98.5 F (36.9 C)   Resp (!) 22    Ht 5\' 2"  (1.575 m)   Wt 72 kg   SpO2 100%   BMI 29.03 kg/m   Physical Exam Constitutional:      General: She is not in acute distress.    Appearance: Normal appearance.  HENT:     Head: Normocephalic.     Nose: Nose normal.     Mouth/Throat:     Mouth: Mucous membranes are moist.     Pharynx: Oropharynx is clear. No oropharyngeal exudate or posterior oropharyngeal erythema.  Eyes:     Extraocular Movements: Extraocular movements intact.  Cardiovascular:     Rate and Rhythm: Normal rate.  Pulmonary:     Effort: Pulmonary effort is normal.  Musculoskeletal:        General: Normal range of motion.     Cervical back: Normal range of motion. No tenderness.  Neurological:     General: No focal deficit present.     Mental Status: She is alert. Mental status is at baseline.     ED Results / Procedures / Treatments   Labs (all labs ordered are listed, but only abnormal results are displayed) Labs Reviewed  GROUP A STREP BY PCR  SARS CORONAVIRUS 2 (TAT 6-24 HRS)    EKG EKG Interpretation  Date/Time:  Monday October 01 2020 05:26:16 EST Ventricular Rate:  96 PR Interval:  144 QRS Duration: 80 QT Interval:  388 QTC Calculation: 490 R Axis:   35 Text Interpretation: Normal sinus rhythm Prolonged QT Abnormal ECG Confirmed by Thamas Jaegers ([id number]) on 10/01/2020 5:46:33 AM   Radiology No results found.  Procedures Procedures   Medications Ordered in ED Medications  acetaminophen (TYLENOL) tablet 650 mg (650 mg Oral Given 10/01/20 0607)  dexamethasone (DECADRON) tablet 6 mg (6 mg Oral Given 10/01/20 5053)    ED Course  I have reviewed the triage vital signs and the nursing notes.  Pertinent labs & imaging results that were available during my care of the patient were reviewed by me and considered in my medical decision making (see chart for details).    MDM Rules/Calculators/A&P                          Patient's posterior pharynx appears clear, no erythema or exudate  noted no tonsillar enlargement noted.  Patient has no cervical adenopathy.  Rapid strep test is otherwise negative.  Covid test sent and pending result.  Patient monitored with no additional adverse events vital signs within normal limits.  Will recommend outpatient follow-up with her doctor  within 3 to 4 days, advised me to return for worsening symptoms difficulty breathing or any additional concerns.  Final Clinical Impression(s) / ED Diagnoses Final diagnoses:  Pharyngitis, unspecified etiology    Rx / DC Orders ED Discharge Orders    None       Luna Fuse, MD 10/01/20 [phone]

## 2020-10-08 DIAGNOSIS — J029 Acute pharyngitis, unspecified: Secondary | ICD-10-CM | POA: Diagnosis not present

## 2020-10-08 DIAGNOSIS — Z6826 Body mass index (BMI) 26.0-26.9, adult: Secondary | ICD-10-CM | POA: Diagnosis not present

## 2020-12-04 DIAGNOSIS — H26491 Other secondary cataract, right eye: Secondary | ICD-10-CM | POA: Diagnosis not present

## 2020-12-04 DIAGNOSIS — Z961 Presence of intraocular lens: Secondary | ICD-10-CM | POA: Diagnosis not present

## 2020-12-14 DIAGNOSIS — R350 Frequency of micturition: Secondary | ICD-10-CM | POA: Diagnosis not present

## 2020-12-14 DIAGNOSIS — N3946 Mixed incontinence: Secondary | ICD-10-CM | POA: Diagnosis not present

## 2021-02-04 DIAGNOSIS — Z961 Presence of intraocular lens: Secondary | ICD-10-CM | POA: Diagnosis not present

## 2021-02-04 DIAGNOSIS — H26493 Other secondary cataract, bilateral: Secondary | ICD-10-CM | POA: Diagnosis not present

## 2021-02-04 DIAGNOSIS — H26491 Other secondary cataract, right eye: Secondary | ICD-10-CM | POA: Diagnosis not present

## 2021-02-04 DIAGNOSIS — H02403 Unspecified ptosis of bilateral eyelids: Secondary | ICD-10-CM | POA: Diagnosis not present

## 2021-02-12 DIAGNOSIS — Z9841 Cataract extraction status, right eye: Secondary | ICD-10-CM | POA: Diagnosis not present

## 2021-02-15 DIAGNOSIS — R69 Illness, unspecified: Secondary | ICD-10-CM | POA: Diagnosis not present

## 2021-03-22 DIAGNOSIS — R1310 Dysphagia, unspecified: Secondary | ICD-10-CM | POA: Diagnosis not present

## 2021-03-22 DIAGNOSIS — K2950 Unspecified chronic gastritis without bleeding: Secondary | ICD-10-CM | POA: Diagnosis not present

## 2021-03-22 DIAGNOSIS — K449 Diaphragmatic hernia without obstruction or gangrene: Secondary | ICD-10-CM | POA: Diagnosis not present

## 2021-03-22 DIAGNOSIS — K222 Esophageal obstruction: Secondary | ICD-10-CM | POA: Diagnosis not present

## 2021-03-22 DIAGNOSIS — K317 Polyp of stomach and duodenum: Secondary | ICD-10-CM | POA: Diagnosis not present

## 2021-03-28 DIAGNOSIS — Z86010 Personal history of colonic polyps: Secondary | ICD-10-CM | POA: Diagnosis not present

## 2021-03-28 DIAGNOSIS — K648 Other hemorrhoids: Secondary | ICD-10-CM | POA: Diagnosis not present

## 2021-03-28 DIAGNOSIS — K644 Residual hemorrhoidal skin tags: Secondary | ICD-10-CM | POA: Diagnosis not present

## 2021-03-28 DIAGNOSIS — Z1211 Encounter for screening for malignant neoplasm of colon: Secondary | ICD-10-CM | POA: Diagnosis not present

## 2021-09-20 DIAGNOSIS — K222 Esophageal obstruction: Secondary | ICD-10-CM | POA: Diagnosis not present

## 2021-09-20 DIAGNOSIS — K219 Gastro-esophageal reflux disease without esophagitis: Secondary | ICD-10-CM | POA: Diagnosis not present

## 2021-10-24 DIAGNOSIS — E538 Deficiency of other specified B group vitamins: Secondary | ICD-10-CM | POA: Diagnosis not present

## 2021-10-24 DIAGNOSIS — R030 Elevated blood-pressure reading, without diagnosis of hypertension: Secondary | ICD-10-CM | POA: Diagnosis not present

## 2021-10-24 DIAGNOSIS — G4700 Insomnia, unspecified: Secondary | ICD-10-CM | POA: Diagnosis not present

## 2021-10-24 DIAGNOSIS — N183 Chronic kidney disease, stage 3 unspecified: Secondary | ICD-10-CM | POA: Diagnosis not present

## 2021-10-24 DIAGNOSIS — F0390 Unspecified dementia without behavioral disturbance: Secondary | ICD-10-CM | POA: Diagnosis not present

## 2021-10-24 DIAGNOSIS — D649 Anemia, unspecified: Secondary | ICD-10-CM | POA: Diagnosis not present

## 2021-12-17 DIAGNOSIS — R350 Frequency of micturition: Secondary | ICD-10-CM | POA: Diagnosis not present

## 2021-12-17 DIAGNOSIS — N3946 Mixed incontinence: Secondary | ICD-10-CM | POA: Diagnosis not present

## 2022-03-07 ENCOUNTER — Encounter (HOSPITAL_BASED_OUTPATIENT_CLINIC_OR_DEPARTMENT_OTHER): Payer: Self-pay

## 2022-03-07 ENCOUNTER — Other Ambulatory Visit: Payer: Self-pay

## 2022-03-07 ENCOUNTER — Emergency Department (HOSPITAL_BASED_OUTPATIENT_CLINIC_OR_DEPARTMENT_OTHER): Payer: Medicare Other

## 2022-03-07 ENCOUNTER — Emergency Department (HOSPITAL_BASED_OUTPATIENT_CLINIC_OR_DEPARTMENT_OTHER)
Admission: EM | Admit: 2022-03-07 | Discharge: 2022-03-07 | Disposition: A | Discharge status: Home / Self Care | Payer: Medicare Other | Attending: Emergency Medicine | Admitting: Emergency Medicine

## 2022-03-07 DIAGNOSIS — R1084 Generalized abdominal pain: Secondary | ICD-10-CM

## 2022-03-07 DIAGNOSIS — N189 Chronic kidney disease, unspecified: Secondary | ICD-10-CM | POA: Diagnosis not present

## 2022-03-07 DIAGNOSIS — K5909 Other constipation: Secondary | ICD-10-CM | POA: Insufficient documentation

## 2022-03-07 DIAGNOSIS — Z7982 Long term (current) use of aspirin: Secondary | ICD-10-CM | POA: Diagnosis not present

## 2022-03-07 DIAGNOSIS — R109 Unspecified abdominal pain: Secondary | ICD-10-CM | POA: Diagnosis not present

## 2022-03-07 DIAGNOSIS — Z853 Personal history of malignant neoplasm of breast: Secondary | ICD-10-CM | POA: Diagnosis not present

## 2022-03-07 DIAGNOSIS — K5900 Constipation, unspecified: Secondary | ICD-10-CM | POA: Diagnosis not present

## 2022-03-07 DIAGNOSIS — D6851 Activated protein C resistance: Secondary | ICD-10-CM | POA: Insufficient documentation

## 2022-03-07 LAB — CBC
HCT: 40.9 % (ref 36.0–46.0)
Hemoglobin: 13.2 g/dL (ref 12.0–15.0)
MCH: 27.8 pg (ref 26.0–34.0)
MCHC: 32.3 g/dL (ref 30.0–36.0)
MCV: 86.3 fL (ref 80.0–100.0)
Platelets: 247 10*3/uL (ref 150–400)
RBC: 4.74 MIL/uL (ref 3.87–5.11)
RDW: 17.5 % — ABNORMAL HIGH (ref 11.5–15.5)
WBC: 7.9 10*3/uL (ref 4.0–10.5)
nRBC: 0 % (ref 0.0–0.2)

## 2022-03-07 LAB — COMPREHENSIVE METABOLIC PANEL
ALT: 18 U/L (ref 0–44)
AST: 22 U/L (ref 15–41)
Albumin: 4.8 g/dL (ref 3.5–5.0)
Alkaline Phosphatase: 53 U/L (ref 38–126)
Anion gap: 14 (ref 5–15)
BUN: 13 mg/dL (ref 8–23)
CO2: 23 mmol/L (ref 22–32)
Calcium: 9.7 mg/dL (ref 8.9–10.3)
Chloride: 102 mmol/L (ref 98–111)
Creatinine, Ser: 0.95 mg/dL (ref 0.44–1.00)
GFR, Estimated: >60 mL/min (ref >60)
Glucose, Bld: 155 mg/dL — ABNORMAL HIGH (ref 70–99)
Potassium: 3.5 mmol/L (ref 3.5–5.1)
Sodium: 139 mmol/L (ref 135–145)
Total Bilirubin: 0.8 mg/dL (ref 0.3–1.2)
Total Protein: 8 g/dL (ref 6.5–8.1)

## 2022-03-07 LAB — OCCULT BLOOD X 1 CARD TO LAB, STOOL: Fecal Occult Bld: POSITIVE — AB

## 2022-03-07 LAB — URINALYSIS, MICROSCOPIC (REFLEX): Bacteria, UA: NONE SEEN

## 2022-03-07 LAB — LIPASE, BLOOD: Lipase: 11 U/L (ref 11–51)

## 2022-03-07 LAB — URINALYSIS, ROUTINE W REFLEX MICROSCOPIC
Glucose, UA: NEGATIVE mg/dL
Ketones, ur: NEGATIVE mg/dL
Leukocytes,Ua: NEGATIVE
Nitrite: NEGATIVE
Protein, ur: 30 mg/dL — AB
Specific Gravity, Urine: 1.02 (ref 1.005–1.030)
pH: 6.5 (ref 5.0–8.0)

## 2022-03-07 MED ORDER — IOHEXOL 300 MG/ML  SOLN
100.0000 mL | Freq: Once | INTRAMUSCULAR | Status: AC | PRN
  Administered 2022-03-07: 80 mL via INTRAVENOUS

## 2022-03-07 MED ORDER — POLYETHYLENE GLYCOL 3350 17 G PO PACK: 17.0000 g | PACK | Freq: Every day | ORAL | 0 refills | Status: DC

## 2022-03-07 NOTE — Discharge Instructions (Addendum)
Please call Dr. Earlean Shawl to schedule a follow-up appointment within the next 2 to 3 days for reevaluation continue medical management.  I have provided you with [facility]'s contact information as well should they be able to get you in sooner.  A prescription for a stool softener by them and MiraLAX has been sent to your pharmacy.  Please use as directed, this is a very mild stool softener.  Return to the ED for new or worsening symptoms as discussed.  These include but are not limited to 2 bowel movements that turned most of the toilet bowl red, severe N/V, fevers, significant worsening abdominal pain or rectal bleeding.

## 2022-03-07 NOTE — ED Notes (Signed)
Discharge instructions, follow up care, and prescriptions reviewed and explained. Pt verbalized understanding and had no further questions on d/c. Pt caox4 and ambulatory on d/c.

## 2022-03-07 NOTE — ED Provider Notes (Signed)
[facility] EMERGENCY DEPT Provider Note   CSN: [id number] Arrival date & time: 03/07/22  1109     History  Chief Complaint  Patient presents with   Abdominal Pain    EZELLA Ali is a 76 y.o. female with history of GERD, DVT, factor V Leiden mutation, hyperlipidemia, chronic kidney disease, headaches, and prior breast cancer.  Presenting today with abdominal pain, constipation, and nausea over the last 4 to 5 days.  Usually has 1 BM per day, however has not passed one in 5 days.  Has had a few episodes of diarrhea, but mainly has not been able to pass stool.  Abdominal pain is intermittent, usually dull and sharp, the right or left side, or both sides.  Pain comes and goes.  Does not know what makes it worse or better.  Noticed blood on the tissue when she wiped the last 2 times she urinated.  Hx of external hemorrhoids that were surgically removed years ago, does not believe she has developed any since then.  Most recent colonoscopy 6 months ago without abnormalities or complications.  Denies fever, chest pain, shortness of breath, neck stiffness, vomiting, or urinary symptoms.  However does endorse decreased appetite and feeling overall "weak" over the last few weeks.  No significant noticeable weight loss per patient and her spouse.  No Hx of abdominal surgeries.  The history is provided by the patient and medical records.  Abdominal Pain Associated symptoms: constipation and nausea        Home Medications Prior to Admission medications   Medication Sig Start Date End Date Taking? Authorizing Provider  polyethylene glycol (MIRALAX / GLYCOLAX) 17 g packet Take 17 g by mouth daily. 03/07/22  Yes Prince Rome, PA-C  acetaminophen (TYLENOL) 500 MG tablet Take by mouth. 750 mg twice daily    [provider]  aspirin EC 81 MG tablet Take 81 mg by mouth daily.    [provider]  Cholecalciferol (VITAMIN D3) 1000 UNITS CAPS Take 1 tablet by mouth 2  (two) times daily.     [provider]  esomeprazole (NEXIUM) 40 MG capsule Take 40 mg by mouth daily. 03/22/20   [provider]  Magnesium 250 MG TABS Take 250 mg by mouth 2 (two) times daily.    [provider]  OVER THE COUNTER MEDICATION Take 3 tablets by mouth at bedtime. Hyland's Leg Cramps PM 03/31/13   [provider]  TOVIAZ 8 MG TB24 tablet Take 8 mg by mouth daily. 04/04/20   [provider]  traZODone (DESYREL) 50 MG tablet Take 25 mg by mouth at bedtime.           Allergies    Aricept [donepezil]    Review of Systems   Review of Systems  Constitutional:  Positive for appetite change.  Gastrointestinal:  Positive for abdominal pain, constipation and nausea.    Physical Exam Updated Vital Signs BP 125/83   Pulse 87   Temp 98.2 F (36.8 C) (Oral)   Resp 20   Ht '5\' 2"'$  (1.575 m)   Wt 72 kg   SpO2 96%   BMI 29.03 kg/m  Physical Exam Vitals and nursing note reviewed. Exam conducted with a chaperone present.  Constitutional:      General: She is not in acute distress.    Appearance: She is well-developed.  HENT:     Head: Normocephalic and atraumatic.     Mouth/Throat:     Mouth: Mucous membranes are  moist.     Pharynx: Oropharynx is clear.  Eyes:     Conjunctiva/sclera: Conjunctivae normal.  Cardiovascular:     Rate and Rhythm: Normal rate and regular rhythm.     Heart sounds: Normal heart sounds. No murmur heard. Pulmonary:     Effort: Pulmonary effort is normal. No respiratory distress.     Breath sounds: Normal breath sounds.  Chest:     Chest wall: No tenderness.  Abdominal:     General: Abdomen is protuberant. Bowel sounds are increased. There is no distension.     Palpations: Abdomen is soft.     Tenderness: There is abdominal tenderness (Generalized right and left-sided tenderness).     Comments: No obvious palpable mass or pulsatile mass appreciated.  Nondistended.  No flank tenderness.  No guarding, rebound,  Murphy sign, or McBurney's sign.  Genitourinary:    Rectum: Guaiac result positive. No mass, tenderness, anal fissure or external hemorrhoid. Normal anal tone.     Comments: No blood visualized exiting or around the vaginal canal Musculoskeletal:        General: No swelling.     Cervical back: Neck supple.  Skin:    General: Skin is warm and dry.     Capillary Refill: Capillary refill takes less than 2 seconds.     Coloration: Skin is not cyanotic or jaundiced.  Neurological:     Mental Status: She is alert and oriented to person, place, and time.  Psychiatric:        Mood and Affect: Mood normal.     ED Results / Procedures / Treatments   Labs (all labs ordered are listed, but only abnormal results are displayed) Labs Reviewed  COMPREHENSIVE METABOLIC PANEL - Abnormal; Notable for the following components:      Result Value   Glucose, Bld 155 (*)    All other components within normal limits  CBC - Abnormal; Notable for the following components:   RDW 17.5 (*)    All other components within normal limits  URINALYSIS, ROUTINE W REFLEX MICROSCOPIC - Abnormal; Notable for the following components:   Hgb urine dipstick MODERATE (*)    Bilirubin Urine SMALL (*)    Protein, ur 30 (*)    All other components within normal limits  OCCULT BLOOD X 1 CARD TO LAB, STOOL - Abnormal; Notable for the following components:   Fecal Occult Bld POSITIVE (*)    All other components within normal limits  LIPASE, BLOOD  URINALYSIS, MICROSCOPIC (REFLEX)  POC OCCULT BLOOD, ED    EKG None  Radiology CT Abdomen Pelvis W Contrast  Result Date: 03/07/2022 CLINICAL DATA:  Bowel obstruction suspected Abdominal pain, acute, nonlocalized. pt states "stomach" pain -- denies N/V/D EXAM: CT ABDOMEN AND PELVIS WITH CONTRAST TECHNIQUE: Multidetector CT imaging of the abdomen and pelvis was performed using the standard protocol following bolus administration of intravenous contrast. RADIATION DOSE  REDUCTION: This exam was performed according to the departmental dose-optimization program which includes automated exposure control, adjustment of the mA and/or kV according to patient size and/or use of iterative reconstruction technique. CONTRAST:  4m OMNIPAQUE IOHEXOL 300 MG/ML  SOLN COMPARISON:  None Available. FINDINGS: Lower chest: Small hiatal hernia. Hepatobiliary: Subcentimeter hypodensity within the right hepatic lobe too small to characterize. Otherwise, no focal liver abnormality. No gallstones, gallbladder wall thickening, or pericholecystic fluid. No biliary dilatation. Pancreas: No focal lesion. Normal pancreatic contour. No surrounding inflammatory changes. No main pancreatic ductal dilatation. Spleen: Normal in size without focal abnormality.  Adrenals/Urinary Tract: No adrenal nodule bilaterally. Bilateral kidneys enhance symmetrically. Subcentimeter hypodensities are too small to characterize. No hydronephrosis. No hydroureter. The urinary bladder is unremarkable. Stomach/Bowel: Stomach is within normal limits. No evidence of bowel wall thickening or dilatation. Appendix appears normal. Vascular/Lymphatic: No abdominal aorta or iliac aneurysm. No abdominal, pelvic, or inguinal lymphadenopathy. Reproductive: Uterus and bilateral adnexa are unremarkable. Other: No intraperitoneal free fluid. No intraperitoneal free gas. No organized fluid collection. Musculoskeletal: No abdominal wall hernia or abnormality. Densely sclerotic lesions within the left sacral al a likely bone islands. No suspicious lytic or blastic osseous lesions. No acute displaced fracture. Dextroscoliosis of the thoracolumbar spine with associated multilevel degenerative changes of the spine. IMPRESSION: Small hiatal hernia. Otherwise no acute intra-abdominal or intrapelvic abnormality. Electronically Signed   By: Iven Finn M.D.   On: 03/07/2022 16:23    Procedures Procedures    Medications Ordered in ED Medications   iohexol (OMNIPAQUE) 300 MG/ML solution 100 mL (80 mLs Intravenous Contrast Given 03/07/22 1604)    ED Course/ Medical Decision Making/ A&P                           Medical Decision Making Amount and/or Complexity of Data Reviewed Labs: ordered. Radiology: ordered.  Risk Prescription drug management.   76 y.o. female presents to the ED for concern of Abdominal Pain     This involves an extensive number of treatment options, and is a complaint that carries with it a high risk of complications and morbidity.  The emergent differential diagnosis prior to evaluation includes, but is not limited to: Constipation, bowel obstruction, bowel perforation, diverticulitis, renal calculi  This is not an exhaustive differential.   Past Medical History / Co-morbidities / Social History: Hx of GERD, DVT, factor V Leiden mutation, hyperlipidemia, chronic kidney disease, headaches, and prior breast cancer Social Determinants of Health include: Elderly  Additional History:  None  Lab Tests: I ordered, and personally interpreted labs.  The pertinent results include:   CBC: Unremarkable.  No evidence of anemia. CMP/BMP: Glucose 155 UA: Protein 30, small bilirubin.  No evidence of infection. Lipase: 11 Hemoccult: POSITIVE  Imaging Studies: I ordered imaging studies including CT abdomen and pelvis.   I independently visualized and interpreted imaging which showed no acute abdominopelvic pathology I agree with the radiologist interpretation.  Cardiac Monitoring: The patient was maintained on a cardiac monitor.  I personally viewed and interpreted the cardiac monitored which showed an underlying rhythm of: Normal sinus rhythm  ED Course / Critical Interventions: Pt well-appearing on exam.  Nonseptic, nontoxic appearing in NAD.  Presenting with 5 days of abdominal pain, nausea, and constipation.  Normally has 1 BM per day.  1-2 episodes of diarrhea.  No bloody bowel movements, however noticed  blood on the tissue when wiping after urinating yesterday and today.  Afebrile, hemodynamically stable.  Abdomen soft, mild tenderness on the right and left side generally.  No flank pain or urinary symptoms, low initial suspicion for renal calculi, acute cystitis, or pyelonephritis.  Tried getting in with her GI specialist but was unable to.  Not on anticoagulation.  Without significant unexplained weight loss. Hemoccult positive with scant blood visualized on rectal exam.  No active hemorrhage or vaginal bleeding appreciated.  Without evidence of external hemorrhoids, anal fissure, or anal fistula on exam.  Possible internal hemorrhoids on exam.  Reviewed labs and imaging.  No evidence of anemia.  No leukocytosis.  UA without  evidence of infection or hematuria.   Plan to proceed with CT to assess for bowel obstruction vs diverticulitis vs constipation.  Lower clinical suspicion for bowel perforation or mesenteric ischemia at this time. Radiology reviewed.  No evidence of acute abdominopelvic pathology.  Stool burden non-concerning.  Discussed this with my attending Dr. Rogene Houston.   Upon reevaluation, pt states pain has overall lessened.   Recommend close GI follow up within the next few days for re-evaluation and continued medical management.  Pt states her GI specialist recently retired and requested new referral.  Resources provided.  Also recommended short course of Miralax to assist with mild constipation, and to focus on increased nutritional intake.   Pt satisfied with today's encounter.  In NAD and good condition at time of discharge. I have reviewed the patients home medicines and have made adjustments as needed.  Disposition: Considered admission and after reviewing the patient's encounter today, I feel that the patient would benefit from close outpatient follow up with GI.  Strict return precautions advised.  Discussed course of treatment with the patient, whom demonstrated understanding.  Patient  in agreement and has no further questions.    I discussed this case with my attending, Dr. Rogene Houston, who agreed with the proposed treatment course and cosigned this note including patient's presenting symptoms, physical exam, and planned diagnostics and interventions.  Attending physician stated agreement with plan or made changes to plan which were implemented.     This chart was dictated using voice recognition software.  Despite best efforts to proofread, errors can occur which can change the documentation meaning.        Final Clinical Impression(s) / ED Diagnoses Final diagnoses:  Other constipation  Generalized abdominal pain  Heterozygous factor V Leiden mutation [facility])   Rx / DC Orders ED Discharge Orders          Ordered    polyethylene glycol (MIRALAX / GLYCOLAX) 17 g packet  Daily        03/07/22 1741              Prince Rome, Hershal Coria 96[birth year] 2232    Fredia Sorrow, MD 03/20/22 1757

## 2022-03-07 NOTE — ED Triage Notes (Signed)
Patient here POV from Home.  Endorses ABD Pain for approximately 3 days that is Generalized.  Associated with Nausea. No Emesis. Moderate Diarrhea as well as Constipation.   NAD Noted during Triage. A&Ox4. GCS 15. Ambulatory.

## 2022-03-12 DIAGNOSIS — K5900 Constipation, unspecified: Secondary | ICD-10-CM | POA: Diagnosis not present

## 2022-05-29 ENCOUNTER — Telehealth: Payer: Self-pay | Admitting: Adult Health

## 2022-05-29 NOTE — Telephone Encounter (Signed)
Samantha Ali from the pt's PCP office called stating that the pt's husband has informed them that the pt's memory has been getting worse and that she has been taking care of the banking and things are not working out with her doing so.

## 2022-06-02 ENCOUNTER — Ambulatory Visit: Payer: Medicare Other | Admitting: Adult Health

## 2022-06-02 ENCOUNTER — Encounter: Payer: Self-pay | Admitting: Adult Health

## 2022-06-02 VITALS — BP 141/81 | HR 84 | Ht 63.0 in | Wt 125.0 lb

## 2022-06-02 DIAGNOSIS — R413 Other amnesia: Secondary | ICD-10-CM

## 2022-06-02 DIAGNOSIS — R4189 Other symptoms and signs involving cognitive functions and awareness: Secondary | ICD-10-CM

## 2022-06-02 MED ORDER — MEMANTINE HCL 28 X 5 MG & 21 X 10 MG PO TABS: ORAL_TABLET | ORAL | 0 refills | Status: DC

## 2022-06-02 NOTE — Patient Instructions (Addendum)
Start Namenda titration - you will follow the titration schedule as advised by pharmacy  We will check your urine today and do some lab work to look for reversible causes of recent memory loss  Recommend doing memory exercises at home such as crossword puzzles, word search, sudoku and card games as well as ensuring adequate sleep, healthy diet and routine exercise    Follow-up in 4 months or call earlier if needed     Management of Memory Problems  There are some general things you can do to help manage your memory problems.  Your memory may not in fact recover, but by using techniques and strategies you will be able to manage your memory difficulties better.  1)  Establish a routine. Try to establish and then stick to a regular routine.  By doing this, you will get used to what to expect and you will reduce the need to rely on your memory.  Also, try to do things at the same time of day, such as taking your medication or checking your calendar first thing in the morning. Think about think that you can do as a part of a regular routine and make a list.  Then enter them into a daily planner to remind you.  This will help you establish a routine.  2)  Organize your environment. Organize your environment so that it is uncluttered.  Decrease visual stimulation.  Place everyday items such as keys or cell phone in the same place every day (ie.  Basket next to front door) Use post it notes with a brief message to yourself (ie. Turn off light, lock the door) Use labels to indicate where things go (ie. Which cupboards are for food, dishes, etc.) Keep a notepad and pen by the telephone to take messages  3)  Memory Aids A diary or journal/notebook/daily planner Making a list (shopping list, chore list, to do list that needs to be done) Using an alarm as a reminder (kitchen timer or cell phone alarm) Using cell phone to store information (Notes, Calendar, Reminders) Calendar/White board placed in  a prominent position Post-it notes  In order for memory aids to be useful, you need to have good habits.  It's no good remembering to make a note in your journal if you don't remember to look in it.  Try setting aside a certain time of day to look in journal.  4)  Improving mood and managing fatigue. There may be other factors that contribute to memory difficulties.  Factors, such as anxiety, depression and tiredness can affect memory. Regular gentle exercise can help improve your mood and give you more energy. Simple relaxation techniques may help relieve symptoms of anxiety Try to get back to completing activities or hobbies you enjoyed doing in the past. Learn to pace yourself through activities to decrease fatigue. Find out about some local support groups where you can share experiences with others. Try and achieve 7-8 hours of sleep at night.

## 2022-06-02 NOTE — Progress Notes (Signed)
[facility]  PATIENT: Samantha Ali DOB: 03-Dec-1945  PRIMARY NEUROLOGIST: Dr. Leta Baptist REFERRING CLINICIAN: Kathyrn Lass, MD HISTORY FROM: patient  REASON FOR VISIT: Memory loss   HISTORICAL  CHIEF COMPLAINT:  Chief Complaint  Patient presents with   Follow-up    Pt with husband, rm 3, presents for wosening memory concerns. She has had word finding difficulty. Has some short term memory     HISTORY OF PRESENT ILLNESS:    Update 06/02/2022 JM: Patient returns per request due to concerns of worsening memory accompanied by her husband. Feels like her memory has been a gradual decline since prior visit, can have good days and bad days.  More issues with short-term memory and at times delayed recall.  Husband believes memory has more acutely declined over the past 2 to 3 weeks.  Was recently seen by PCP, denies any work-up at that time for acute decline.  Has been experiencing increased urinary frequency over the past couple of weeks, denies any painful urination or bloody urine.  She is still able to maintain all of her ADLs, able to do light cleaning, husband assists with cooking and bill paying.  Husband questions use of medications, reports previously trying donepezil and had difficulty tolerating, denies previously trying memantine.     Consult visit 04/24/2020 Dr. Leta Baptist: 76 year old female here for evaluation memory loss. Patient has had 6 months of mild short-term memory loss, losing things, forgetting recent conversations. No major changes in ADLs. She takes care of her own personal hygiene, bathing, dressing, feeding, household chores. She still able to drive, shop, manage finances and operate independently outside the home. She was empirically tried on donepezil and memantine but could not tolerate these because of side effects.   REVIEW OF SYSTEMS: Full 14 system review of systems performed and negative with exception of: As per  HPI.  ALLERGIES: Allergies  Allergen Reactions   Aricept [Donepezil]     Bad dreams    HOME MEDICATIONS: Outpatient Medications Prior to Visit  Medication Sig Dispense Refill   acetaminophen (TYLENOL) 500 MG tablet Take by mouth. 750 mg twice daily     aspirin EC 81 MG tablet Take 81 mg by mouth daily.     Cholecalciferol (VITAMIN D3) 1000 UNITS CAPS Take 1 tablet by mouth 2 (two) times daily.      esomeprazole (NEXIUM) 40 MG capsule Take 40 mg by mouth daily.     Magnesium 250 MG TABS Take 250 mg by mouth 2 (two) times daily.     OVER THE COUNTER MEDICATION Take 3 tablets by mouth at bedtime. Hyland's Leg Cramps PM     polyethylene glycol (MIRALAX / GLYCOLAX) 17 g packet Take 17 g by mouth daily. 14 each 0   TOVIAZ 8 MG TB24 tablet Take 8 mg by mouth daily.     traZODone (DESYREL) 50 MG tablet Take 25 mg by mouth at bedtime.      No facility-administered medications prior to visit.    PAST MEDICAL HISTORY: Past Medical History:  Diagnosis Date   Arthritis    R knee   Breast cancer ([place]) 2004   chemo   Cancer ([place])    CKD (chronic kidney disease)    Clot 1996   in the right leg- in the muscle     DVT (deep venous thrombosis) ([place]) 1990's   probably related to positive phlebitis   Factor 5 Leiden mutation, heterozygous ([place]) 11/21/2013   GERD (gastroesophageal reflux disease)  Headache(784.0)    migraines- most often 2-3 times /week    Heterozygous factor V Leiden mutation ([place]) 01/16/2014   Hyperlipidemia    Insomnia    Migraine    Osteoporosis    Photophobia of both eyes    unknown etiology    PAST SURGICAL HISTORY: Past Surgical History:  Procedure Laterality Date   BREAST LUMPECTOMY  02/21/2003   BREAST SURGERY  1987   right lumpectomy for benign disease   EYE SURGERY     cataracts removed -IOL   KNEE SURGERY     MASTECTOMY Left 2004   L parial mastectomy   SENTINEL LYMPH NODE BIOPSY  02/21/2003   SHOULDER SURGERY Right 03/2010   rotator cuff    TONSILLECTOMY     TOTAL KNEE ARTHROPLASTY Right 08/09/2013   Procedure: TOTAL KNEE ARTHROPLASTY;  Surgeon: Hessie Dibble, MD;  Location: [facility];  Service: Orthopedics;  Laterality: Right;   TUBAL LIGATION      FAMILY HISTORY: Family History  Problem Relation Age of Onset   Heart disease Father     SOCIAL HISTORY: Social History   Socioeconomic History   Marital status: Married    Spouse name: Herbie Baltimore   Number of children: 1   Years of education: 12   Highest education level: Not on file  Occupational History   Occupation: collections  Tobacco Use   Smoking status: Never   Smokeless tobacco: Never  Substance and Sexual Activity   Alcohol use: Never    Alcohol/week: 0.0 standard drinks of alcohol   Drug use: Never   Sexual activity: Not on file  Other Topics Concern   Not on file  Social History Narrative   Lives with husband   No caffiene   Social Determinants of Health   Financial Resource Strain: Not on file  Food Insecurity: Not on file  Transportation Needs: Not on file  Physical Activity: Not on file  Stress: Not on file  Social Connections: Not on file  Intimate Partner Violence: Not on file     PHYSICAL EXAM  GENERAL EXAM/CONSTITUTIONAL: Vitals:  Vitals:   06/02/22 1305  BP: (!) 141/81  Pulse: 84  Weight: 125 lb (56.7 kg)  Height: '5\' 3"'$  (1.6 m)    Body mass index is 22.14 kg/m. Wt Readings from Last 3 Encounters:  06/02/22 125 lb (56.7 kg)  03/07/22 158 lb 11.7 oz (72 kg)  10/01/20 158 lb 11.7 oz (72 kg)   Patient is in no distress; well developed, nourished and groomed; neck is supple  CARDIOVASCULAR: Examination of carotid arteries is normal; no carotid bruits Regular rate and rhythm, no murmurs Examination of peripheral vascular system by observation and palpation is normal  EYES: Ophthalmoscopic exam of optic discs and posterior segments is normal; no papilledema or hemorrhages No results found.  MUSCULOSKELETAL: Gait,  strength, tone, movements noted in Neurologic exam below  NEUROLOGIC: MENTAL STATUS:     06/02/2022    1:10 PM 04/24/2020    3:01 PM  MMSE - Mini Mental State Exam  Orientation to time 1 3  Orientation to Place 3 4  Registration 3 3  Attention/ Calculation 0 1  Recall 0 0  Language- name 2 objects 2 2  Language- repeat 1 0  Language- follow 3 step command 3 3  Language- read & follow direction 1 1  Write a sentence 1 1  Copy design 0 0  Total score 15 18   awake, alert, disoriented to person, place and  time recent memory impaired and remote memory intact normal attention and concentration language fluent, comprehension intact, naming intact fund of knowledge appropriate  CRANIAL NERVE:  2nd - no papilledema on fundoscopic exam 2nd, 3rd, 4th, 6th - pupils equal and reactive to light, visual fields full to confrontation, extraocular muscles intact, no nystagmus 5th - facial sensation symmetric 7th - facial strength symmetric 8th - hearing intact 9th - palate elevates symmetrically, uvula midline 11th - shoulder shrug symmetric 12th - tongue protrusion midline  MOTOR:  normal bulk and tone, full strength in the BUE, BLE  SENSORY:  normal and symmetric to light touch, temperature, vibration  COORDINATION:  finger-nose-finger, fine finger movements normal  REFLEXES:  deep tendon reflexes present and symmetric  GAIT/STATION:  narrow based gait     DIAGNOSTIC DATA (LABS, IMAGING, TESTING) - I reviewed patient records, labs, notes, testing and imaging myself where available.  Lab Results  Component Value Date   WBC 7.9 03/07/2022   HGB 13.2 03/07/2022   HCT 40.9 03/07/2022   MCV 86.3 03/07/2022   PLT 247 03/07/2022      Component Value Date/Time   NA 139 03/07/2022 1139   NA 143 05/30/2013 1040   K 3.5 03/07/2022 1139   K 4.6 05/30/2013 1040   CL 102 03/07/2022 1139   CL 105 05/05/2012 1058   CO2 23 03/07/2022 1139   CO2 27 05/30/2013 1040   GLUCOSE  155 (H) 03/07/2022 1139   GLUCOSE 98 05/30/2013 1040   GLUCOSE 93 05/05/2012 1058   BUN 13 03/07/2022 1139   BUN 14.4 05/30/2013 1040   CREATININE 0.95 03/07/2022 1139   CREATININE 1.1 05/30/2013 1040   CALCIUM 9.7 03/07/2022 1139   CALCIUM 10.1 05/30/2013 1040   PROT 8.0 03/07/2022 1139   PROT 7.7 05/30/2013 1040   ALBUMIN 4.8 03/07/2022 1139   ALBUMIN 3.8 05/30/2013 1040   AST 22 03/07/2022 1139   AST 26 05/30/2013 1040   ALT 18 03/07/2022 1139   ALT 22 05/30/2013 1040   ALKPHOS 53 03/07/2022 1139   ALKPHOS 88 05/30/2013 1040   BILITOT 0.8 03/07/2022 1139   BILITOT 0.59 05/30/2013 1040   GFRNONAA >60 03/07/2022 1139   GFRAA 72 (L) 11/15/2013 0630   Lab Results  Component Value Date   CHOL 150 05/27/2010   HDL 50 05/27/2010   LDLCALC 87 05/27/2010   TRIG 63 05/27/2010   CHOLHDL 3.0 05/27/2010   No results found for: "HGBA1C" Lab Results  Component Value Date   VITAMINB12 651 11/15/2013   No results found for: "TSH"   03/08/19 MRI brain - Mild biparietal atrophy.  - Otherwise normal.      ASSESSMENT AND PLAN  76 y.o. year old female here with mild memory loss since early 2021. No major changes in ADLs. Could represent mild cognitive impairment. MMSE is 18 out of 30, which is lower than expected given her lack of change in ADLs, could be related to effort or other factors.  Dx:  1. Memory loss   2. Acute cognitive decline       PLAN:  MILD MEMORY LOSS (possible MCI vs mild prodromal dementia) COGNITIVE DECLINE -will check lab work and UA to rule out reversible causes of more recent decline -neuro exam intact, no indication for further imaging/work up at this time -MMSE today 15/30 (prior 18/30 in 2021) -trial memantine titration to eventual dose of 10 mg twice daily -Prior intolerance to donepezil  -Discussed importance of routine memory exercises and  social activities, adequate sleep, healthy diet and routine physical activity/exercise   Return in  about 4 months (around 10/01/2022).   I spent 34 minutes of face-to-face and non-face-to-face time with patient and husband.  This included previsit chart review, lab review, study review, order entry, electronic health record documentation, patient and husband education and discussion regarding above diagnoses and treatment plan and answered all other questions to patient and husband satisfaction  Frann Rider, AGNP-BC  [facility] [address] [place], [place] [address]  Phone [phone] Fax [phone] Note: This document was prepared with digital dictation and possible smart phrase technology. Any transcriptional errors that result from this process are unintentional.

## 2022-06-03 ENCOUNTER — Telehealth: Payer: Self-pay | Admitting: Neurology

## 2022-06-03 LAB — URINALYSIS, ROUTINE W REFLEX MICROSCOPIC
Bilirubin, UA: NEGATIVE
Glucose, UA: NEGATIVE
Nitrite, UA: NEGATIVE
RBC, UA: NEGATIVE
Specific Gravity, UA: >=1.03 — AB (ref 1.005–1.030)
Urobilinogen, Ur: 1 mg/dL (ref 0.2–1.0)
pH, UA: 6 (ref 5.0–7.5)

## 2022-06-03 LAB — CBC WITH DIFFERENTIAL/PLATELET
Basophils Absolute: 0.1 10*3/uL (ref 0.0–0.2)
Basos: 1 %
EOS (ABSOLUTE): 0 10*3/uL (ref 0.0–0.4)
Eos: 0 %
Hematocrit: 37.9 % (ref 34.0–46.6)
Hemoglobin: 12.4 g/dL (ref 11.1–15.9)
Immature Grans (Abs): 0 10*3/uL (ref 0.0–0.1)
Immature Granulocytes: 0 %
Lymphocytes Absolute: 1.5 10*3/uL (ref 0.7–3.1)
Lymphs: 18 %
MCH: 28.4 pg (ref 26.6–33.0)
MCHC: 32.7 g/dL (ref 31.5–35.7)
MCV: 87 fL (ref 79–97)
Monocytes Absolute: 0.6 10*3/uL (ref 0.1–0.9)
Monocytes: 7 %
Neutrophils Absolute: 6.2 10*3/uL (ref 1.4–7.0)
Neutrophils: 74 %
Platelets: 354 10*3/uL (ref 150–450)
RBC: 4.36 x10E6/uL (ref 3.77–5.28)
RDW: 14 % (ref 11.7–15.4)
WBC: 8.5 10*3/uL (ref 3.4–10.8)

## 2022-06-03 LAB — MICROSCOPIC EXAMINATION
Bacteria, UA: NONE SEEN
Casts: NONE SEEN /lpf
RBC, Urine: NONE SEEN /hpf (ref 0–2)

## 2022-06-03 LAB — COMPREHENSIVE METABOLIC PANEL
ALT: 13 IU/L (ref 0–32)
AST: 19 IU/L (ref 0–40)
Albumin/Globulin Ratio: 2.1 (ref 1.2–2.2)
Albumin: 4.8 g/dL (ref 3.8–4.8)
Alkaline Phosphatase: 81 IU/L (ref 44–121)
BUN/Creatinine Ratio: 8 — ABNORMAL LOW (ref 12–28)
BUN: 9 mg/dL (ref 8–27)
Bilirubin Total: 0.5 mg/dL (ref 0.0–1.2)
CO2: 25 mmol/L (ref 20–29)
Calcium: 9.8 mg/dL (ref 8.7–10.3)
Chloride: 97 mmol/L (ref 96–106)
Creatinine, Ser: 1.09 mg/dL — ABNORMAL HIGH (ref 0.57–1.00)
Globulin, Total: 2.3 g/dL (ref 1.5–4.5)
Glucose: 93 mg/dL (ref 70–99)
Potassium: 4.1 mmol/L (ref 3.5–5.2)
Sodium: 142 mmol/L (ref 134–144)
Total Protein: 7.1 g/dL (ref 6.0–8.5)
eGFR: 52 mL/min/{1.73_m2} — ABNORMAL LOW (ref >59)

## 2022-06-03 LAB — TSH: TSH: 2.02 u[IU]/mL (ref 0.450–4.500)

## 2022-06-03 LAB — VITAMIN B12: Vitamin B-12: 820 pg/mL (ref 232–1245)

## 2022-06-03 NOTE — Telephone Encounter (Signed)
-----   Message from Frann Rider, NP sent at 06/03/2022  3:10 PM EST ----- Please advise patient/husband that recent lab work largely unremarkable.  Some evidence of mild dehydration and would highly encourage increasing water intake.  No evidence of urinary tract infection.

## 2022-06-03 NOTE — Telephone Encounter (Signed)
Called the patient and there was no answer. Left a detailed message advising the patient of the results from the lab work and that there was no evidence of UTI. Instructed them to call back if there was any question.

## 2022-06-25 ENCOUNTER — Telehealth: Payer: Self-pay | Admitting: Adult Health

## 2022-06-25 NOTE — Telephone Encounter (Signed)
Contacted pt spouse back, he stated they were going out of town so she didn't start medication after visit. She started taking Namenda Monday the 27th. She is only taking 5 MG currently.  Since stating it, Her mood has changed, she has crying spells and accusing him of things he didn't do. She was completely fine after visit and during [place] vacation. No changes in any other meds or anything since. What do you recommend?

## 2022-06-25 NOTE — Telephone Encounter (Signed)
Pt husband is calling. Stated he needs to talk to Janett Billow because pt is on new medication and "" she has snapped "". He is requesting a call-back from nurse.

## 2022-06-25 NOTE — Telephone Encounter (Signed)
Stop the medication and see if symptoms improve. If not, please f/u with PCP to ensure nothing else is going on that could be causing symptoms. Thank you.

## 2022-06-25 NOTE — Telephone Encounter (Signed)
Contacted spouse back, informed him of jessica's recommendations. He verbally understood and was appreciative

## 2022-07-08 DIAGNOSIS — Z961 Presence of intraocular lens: Secondary | ICD-10-CM | POA: Diagnosis not present

## 2022-07-18 DIAGNOSIS — K317 Polyp of stomach and duodenum: Secondary | ICD-10-CM | POA: Diagnosis not present

## 2022-07-18 DIAGNOSIS — K222 Esophageal obstruction: Secondary | ICD-10-CM | POA: Diagnosis not present

## 2022-07-18 DIAGNOSIS — K219 Gastro-esophageal reflux disease without esophagitis: Secondary | ICD-10-CM | POA: Diagnosis not present

## 2022-07-18 DIAGNOSIS — F0390 Unspecified dementia without behavioral disturbance: Secondary | ICD-10-CM | POA: Diagnosis not present

## 2022-07-18 DIAGNOSIS — K449 Diaphragmatic hernia without obstruction or gangrene: Secondary | ICD-10-CM | POA: Diagnosis not present

## 2022-10-07 NOTE — Progress Notes (Unsigned)
[facility]  PATIENT: Samantha Ali DOB: Mar 04, 1946  PRIMARY NEUROLOGIST: Dr. Leta Baptist REFERRING CLINICIAN: Kathyrn Lass, MD HISTORY FROM: patient and husband REASON FOR VISIT: Memory loss   HISTORICAL  CHIEF COMPLAINT:  Chief Complaint  Patient presents with   Follow-up    Patient in room #3 with her husband. Patient states she is well and stable, no new concerns.    HISTORY OF PRESENT ILLNESS:    Update 10/08/2022 JM: Returns for follow-up visit accompanied by her husband.  Was started on Namenda at prior visit but had difficulty tolerating therefore discontinued.  Reports cognition has been relatively stable, has good days and bad days. Does get frustrated easily with memory loss, such as forgetting where she places items. Per husband, she will frequently misplace items such as her purse, checkbook, phone, etc and become very upset when she and her husband cannot locate these items. Husband has been trying to work on ensuring she keeps these items in the same place over the past week.  She does admit to being sedentary, does not do any type of physical activity or mental stimulation. Sleeping well. Appetite good for the most part.  MMSE today 17/30 (prior 15/30), becomes tearful during memory testing due to frustration with missing questions.    History provided for reference purposes only Update 06/02/2022 JM: Patient returns per request due to concerns of worsening memory accompanied by her husband. Feels like her memory has been a gradual decline since prior visit, can have good days and bad days.  More issues with short-term memory and at times delayed recall.  Husband believes memory has more acutely declined over the past 2 to 3 weeks.  Was recently seen by PCP, denies any work-up at that time for acute decline.  Has been experiencing increased urinary frequency over the past couple of weeks, denies any painful urination or bloody urine.  She is still able to  maintain all of her ADLs, able to do light cleaning, husband assists with cooking and bill paying.  Husband questions use of medications, reports previously trying donepezil and had difficulty tolerating, denies previously trying memantine.   Consult visit 04/24/2020 Dr. Leta Baptist: 77 year old female here for evaluation memory loss. Patient has had 6 months of mild short-term memory loss, losing things, forgetting recent conversations. No major changes in ADLs. She takes care of her own personal hygiene, bathing, dressing, feeding, household chores. She still able to drive, shop, manage finances and operate independently outside the home. She was empirically tried on donepezil and memantine but could not tolerate these because of side effects.   REVIEW OF SYSTEMS: Full 14 system review of systems performed and negative with exception of: As per HPI.  ALLERGIES: Allergies  Allergen Reactions   Aricept [Donepezil]     Bad dreams    HOME MEDICATIONS: Outpatient Medications Prior to Visit  Medication Sig Dispense Refill   acetaminophen (TYLENOL) 500 MG tablet Take by mouth. 750 mg twice daily     aspirin EC 81 MG tablet Take 81 mg by mouth daily.     Cholecalciferol (VITAMIN D3) 1000 UNITS CAPS Take 1 tablet by mouth 2 (two) times daily.      esomeprazole (NEXIUM) 40 MG capsule Take 40 mg by mouth daily.     Magnesium 250 MG TABS Take 250 mg by mouth 2 (two) times daily.     OVER THE COUNTER MEDICATION Take 3 tablets by mouth at bedtime. Hyland's Leg Cramps PM  polyethylene glycol (MIRALAX / GLYCOLAX) 17 g packet Take 17 g by mouth daily. 14 each 0   TOVIAZ 8 MG TB24 tablet Take 8 mg by mouth daily.     traZODone (DESYREL) 50 MG tablet Take 25 mg by mouth at bedtime.      memantine (NAMENDA TITRATION PAK) tablet pack 5 mg/day for =1 week; 5 mg twice daily for =1 week; 15 mg/day given in 5 mg and 10 mg separated doses for =1 week; then 10 mg twice daily 49 tablet 0   No facility-administered  medications prior to visit.    PAST MEDICAL HISTORY: Past Medical History:  Diagnosis Date   Arthritis    R knee   Breast cancer ([place]) 2004   chemo   Cancer ([place])    CKD (chronic kidney disease)    Clot 1996   in the right leg- in the muscle     DVT (deep venous thrombosis) ([place]) 1990's   probably related to positive phlebitis   Factor 5 Leiden mutation, heterozygous ([place]) 11/21/2013   GERD (gastroesophageal reflux disease)    Headache(784.0)    migraines- most often 2-3 times /week    Heterozygous factor V Leiden mutation ([place]) 01/16/2014   Hyperlipidemia    Insomnia    Migraine    Osteoporosis    Photophobia of both eyes    unknown etiology    PAST SURGICAL HISTORY: Past Surgical History:  Procedure Laterality Date   BREAST LUMPECTOMY  02/21/2003   BREAST SURGERY  1987   right lumpectomy for benign disease   EYE SURGERY     cataracts removed -IOL   KNEE SURGERY     MASTECTOMY Left 2004   L parial mastectomy   SENTINEL LYMPH NODE BIOPSY  02/21/2003   SHOULDER SURGERY Right 03/2010   rotator cuff   TONSILLECTOMY     TOTAL KNEE ARTHROPLASTY Right 08/09/2013   Procedure: TOTAL KNEE ARTHROPLASTY;  Surgeon: Hessie Dibble, MD;  Location: [facility];  Service: Orthopedics;  Laterality: Right;   TUBAL LIGATION      FAMILY HISTORY: Family History  Problem Relation Age of Onset   Heart disease Father     SOCIAL HISTORY: Social History   Socioeconomic History   Marital status: Married    Spouse name: Herbie [place]   Number of children: 1   Years of education: 12   Highest education level: Not on file  Occupational History   Occupation: collections  Tobacco Use   Smoking status: Never   Smokeless tobacco: Never  Substance and Sexual Activity   Alcohol use: Never    Alcohol/week: 0.0 standard drinks of alcohol   Drug use: Never   Sexual activity: Not on file  Other Topics Concern   Not on file  Social History Narrative   Lives with husband   No caffiene    Social Determinants of Health   Financial Resource Strain: Not on file  Food Insecurity: Not on file  Transportation Needs: Not on file  Physical Activity: Not on file  Stress: Not on file  Social Connections: Not on file  Intimate Partner Violence: Not on file     PHYSICAL EXAM  GENERAL EXAM/CONSTITUTIONAL: Vitals:  Vitals:   10/08/22 1518  BP: 128/77  Pulse: 92  Weight: 122 lb 9.6 oz (55.6 kg)  Height: '5\' 1"'$  (1.549 m)     Body mass index is 23.17 kg/m. Wt Readings from Last 3 Encounters:  10/08/22 122 lb 9.6 oz (55.6 kg)  06/02/22  125 lb (56.7 kg)  03/07/22 158 lb 11.7 oz (72 kg)   Patient is in no distress; well developed, nourished and groomed; neck is supple  CARDIOVASCULAR: Examination of carotid arteries is normal; no carotid bruits Regular rate and rhythm, no murmurs Examination of peripheral vascular system by observation and palpation is normal  EYES: Ophthalmoscopic exam of optic discs and posterior segments is normal; no papilledema or hemorrhages No results found.  MUSCULOSKELETAL: Gait, strength, tone, movements noted in Neurologic exam below  NEUROLOGIC: MENTAL STATUS:     10/08/2022    3:30 PM 06/02/2022    1:10 PM 04/24/2020    3:01 PM  MMSE - Mini Mental State Exam  Orientation to time '4 1 3  '$ Orientation to Place '5 3 4  '$ Registration '3 3 3  '$ Attention/ Calculation 1 0 1  Recall 0 0 0  Language- name 2 objects '2 2 2  '$ Language- repeat 1 1 0  Language- follow 3 step command 0 3 3  Language- read & follow direction '1 1 1  '$ Write a sentence 0 1 1  Copy design 0 0 0  Total score '17 15 18   '$ awake, alert recent memory impaired and remote memory intact normal attention and concentration language fluent, comprehension intact, naming intact fund of knowledge appropriate  CRANIAL NERVE:  2nd - no papilledema on fundoscopic exam 2nd, 3rd, 4th, 6th - pupils equal and reactive to light, visual fields full to confrontation, extraocular  muscles intact, no nystagmus 5th - facial sensation symmetric 7th - facial strength symmetric 8th - hearing intact 9th - palate elevates symmetrically, uvula midline 11th - shoulder shrug symmetric 12th - tongue protrusion midline  MOTOR:  normal bulk and tone, full strength in the BUE, BLE  SENSORY:  normal and symmetric to light touch, temperature, vibration  COORDINATION:  finger-nose-finger, fine finger movements normal  REFLEXES:  deep tendon reflexes present and symmetric  GAIT/STATION:  narrow based gait     DIAGNOSTIC DATA (LABS, IMAGING, TESTING) - I reviewed patient records, labs, notes, testing and imaging myself where available.  Lab Results  Component Value Date   WBC 8.5 06/02/2022   HGB 12.4 06/02/2022   HCT 37.9 06/02/2022   MCV 87 06/02/2022   PLT 354 06/02/2022      Component Value Date/Time   NA 142 06/02/2022 1351   NA 143 05/30/2013 1040   K 4.1 06/02/2022 1351   K 4.6 05/30/2013 1040   CL 97 06/02/2022 1351   CL 105 05/05/2012 1058   CO2 25 06/02/2022 1351   CO2 27 05/30/2013 1040   GLUCOSE 93 06/02/2022 1351   GLUCOSE 155 (H) 03/07/2022 1139   GLUCOSE 98 05/30/2013 1040   GLUCOSE 93 05/05/2012 1058   BUN 9 06/02/2022 1351   BUN 14.4 05/30/2013 1040   CREATININE 1.09 (H) 06/02/2022 1351   CREATININE 1.1 05/30/2013 1040   CALCIUM 9.8 06/02/2022 1351   CALCIUM 10.1 05/30/2013 1040   PROT 7.1 06/02/2022 1351   PROT 7.7 05/30/2013 1040   ALBUMIN 4.8 06/02/2022 1351   ALBUMIN 3.8 05/30/2013 1040   AST 19 06/02/2022 1351   AST 26 05/30/2013 1040   ALT 13 06/02/2022 1351   ALT 22 05/30/2013 1040   ALKPHOS 81 06/02/2022 1351   ALKPHOS 88 05/30/2013 1040   BILITOT 0.5 06/02/2022 1351   BILITOT 0.59 05/30/2013 1040   GFRNONAA >60 03/07/2022 1139   GFRAA 72 (L) 11/15/2013 0630   Lab Results  Component Value Date  CHOL 150 05/27/2010   HDL 50 05/27/2010   LDLCALC 87 05/27/2010   TRIG 63 05/27/2010   CHOLHDL 3.0 05/27/2010   No  results found for: "HGBA1C" Lab Results  Component Value Date   VITAMINB12 820 06/02/2022   Lab Results  Component Value Date   TSH 2.020 06/02/2022     03/08/19 MRI brain - Mild biparietal atrophy.  - Otherwise normal.      ASSESSMENT AND PLAN  77 y.o. year old female here with mild memory loss since early 2021. No major changes in ADLs. Could represent mild cognitive impairment. MMSE is 18 out of 30, which is lower than expected given her lack of change in ADLs, could be related to effort or other factors.  Dx:  1. Memory loss     PLAN:  MILD MEMORY LOSS (possible MCI vs mild prodromal dementia) -MMSE today 17/30 (prior 15/30) -patient interested in trialing Exelon, will start transdermal patch as she has difficulty swallowing pills, capsules unable to be opened  -Difficulty tolerating memantine and donepezil -Discussed importance of increasing physical activity and mental stimulation and social activities, ensuring adequate sleep and healthy diet -Would recommend follow-up with PCP with suspected underlying depression/anxiety for further treatment    Return in about 6 months (around 04/10/2023).   I spent 36 minutes of face-to-face and non-face-to-face time with patient and husband.  This included previsit chart review, lab review, study review, order entry, electronic health record documentation, patient and husband education and discussion regarding above diagnoses and treatment plan and answered all other questions to patient and husband satisfaction  Frann Rider, AGNP-BC  [facility] [address] [place], [place] [address]  Phone [phone] Fax [phone] Note: This document was prepared with digital dictation and possible smart phrase technology. Any transcriptional errors that result from this process are unintentional.

## 2022-10-08 ENCOUNTER — Encounter: Payer: Self-pay | Admitting: Adult Health

## 2022-10-08 ENCOUNTER — Ambulatory Visit: Payer: Medicare Other | Admitting: Adult Health

## 2022-10-08 VITALS — BP 128/77 | HR 92 | Ht 61.0 in | Wt 122.6 lb

## 2022-10-08 DIAGNOSIS — F32A Depression, unspecified: Secondary | ICD-10-CM

## 2022-10-08 DIAGNOSIS — R413 Other amnesia: Secondary | ICD-10-CM

## 2022-10-08 DIAGNOSIS — F419 Anxiety disorder, unspecified: Secondary | ICD-10-CM | POA: Diagnosis not present

## 2022-10-08 MED ORDER — RIVASTIGMINE 4.6 MG/24HR TD PT24: 4.6000 mg | MEDICATED_PATCH | Freq: Every day | TRANSDERMAL | 12 refills | Status: DC

## 2022-10-08 NOTE — Patient Instructions (Addendum)
Recommend starting Exelon patch every 24 hours - can be done either in the morning or in the evening, as long as its the same time every day  Ensure you increase daily activity and mentally stimulating activities as well as socialization as this can help with your memory  Please follow up with Dr. Sabra Heck regarding underlying depression/anxiety management     Follow up in 6 months or call earlier if needed

## 2022-10-27 DIAGNOSIS — N183 Chronic kidney disease, stage 3 unspecified: Secondary | ICD-10-CM | POA: Diagnosis not present

## 2022-10-27 DIAGNOSIS — M8589 Other specified disorders of bone density and structure, multiple sites: Secondary | ICD-10-CM | POA: Diagnosis not present

## 2022-10-27 DIAGNOSIS — F0393 Unspecified dementia, unspecified severity, with mood disturbance: Secondary | ICD-10-CM | POA: Diagnosis not present

## 2022-10-27 DIAGNOSIS — M8580 Other specified disorders of bone density and structure, unspecified site: Secondary | ICD-10-CM | POA: Diagnosis not present

## 2022-10-27 DIAGNOSIS — D649 Anemia, unspecified: Secondary | ICD-10-CM | POA: Diagnosis not present

## 2022-10-27 DIAGNOSIS — E7800 Pure hypercholesterolemia, unspecified: Secondary | ICD-10-CM | POA: Diagnosis not present

## 2022-10-27 DIAGNOSIS — Z23 Encounter for immunization: Secondary | ICD-10-CM | POA: Diagnosis not present

## 2022-10-31 DIAGNOSIS — Z1231 Encounter for screening mammogram for malignant neoplasm of breast: Secondary | ICD-10-CM | POA: Diagnosis not present

## 2023-01-09 DIAGNOSIS — N3946 Mixed incontinence: Secondary | ICD-10-CM | POA: Diagnosis not present

## 2023-02-02 DIAGNOSIS — F32A Depression, unspecified: Secondary | ICD-10-CM | POA: Diagnosis not present

## 2023-02-02 DIAGNOSIS — F0390 Unspecified dementia without behavioral disturbance: Secondary | ICD-10-CM | POA: Diagnosis not present

## 2023-02-02 DIAGNOSIS — G4700 Insomnia, unspecified: Secondary | ICD-10-CM | POA: Diagnosis not present

## 2023-02-10 DIAGNOSIS — S01302A Unspecified open wound of left ear, initial encounter: Secondary | ICD-10-CM | POA: Diagnosis not present

## 2023-04-14 ENCOUNTER — Ambulatory Visit: Payer: Medicare Other | Admitting: Adult Health

## 2023-04-14 ENCOUNTER — Encounter: Payer: Self-pay | Admitting: Adult Health

## 2023-04-14 VITALS — BP 126/70 | HR 75 | Ht 63.0 in | Wt 137.2 lb

## 2023-04-14 DIAGNOSIS — G3184 Mild cognitive impairment, so stated: Secondary | ICD-10-CM | POA: Diagnosis not present

## 2023-04-14 NOTE — Progress Notes (Signed)
[facility]  PATIENT: Samantha Ali DOB: January 03, 1946  PRIMARY NEUROLOGIST: Dr. Marjory Lies REFERRING CLINICIAN: Sigmund Hazel, MD HISTORY FROM: patient and husband REASON FOR VISIT: Memory loss   HISTORICAL  CHIEF COMPLAINT:  Chief Complaint  Patient presents with   Follow-up    Patient in room #3 with her husband. Patient states she here to follow up with her memory issues but has been doing great.    HISTORY OF PRESENT ILLNESS:   Update 04/14/2023 JM: Patient returns for follow-up visit accompanied by her husband.  Started on Exelon transdermal patches at prior visit.  Believes memory stable. Continues to maintain ADL's independently.  Husband assist with IADLs.  Denies behavioral concerns. Appetite has been good. Sleeping well, has not needed trazodone (rx'd by PCP).  Tries to stay active at home but overall relatively sedentary.  No routine physical or mental activities.  MMSE today 15/30 (17/30). Is tearful today during visit due to continued memory difficulties and that fact that there is no cure.     History provided for reference purposes only Update 10/08/2022 JM: Returns for follow-up visit accompanied by her husband.  Was started on Namenda at prior visit but had difficulty tolerating therefore discontinued.  Reports cognition has been relatively stable, has good days and bad days. Does get frustrated easily with memory loss, such as forgetting where she places items. Per husband, she will frequently misplace items such as her purse, checkbook, phone, etc and become very upset when she and her husband cannot locate these items. Husband has been trying to work on ensuring she keeps these items in the same place over the past week.  She does admit to being sedentary, does not do any type of physical activity or mental stimulation. Sleeping well. Appetite good for the most part.  MMSE today 17/30 (prior 15/30), becomes tearful during memory testing due to frustration  with missing questions.   Update 06/02/2022 JM: Patient returns per request due to concerns of worsening memory accompanied by her husband. Feels like her memory has been a gradual decline since prior visit, can have good days and bad days.  More issues with short-term memory and at times delayed recall.  Husband believes memory has more acutely declined over the past 2 to 3 weeks.  Was recently seen by PCP, denies any work-up at that time for acute decline.  Has been experiencing increased urinary frequency over the past couple of weeks, denies any painful urination or bloody urine.  She is still able to maintain all of her ADLs, able to do light cleaning, husband assists with cooking and bill paying.  Husband questions use of medications, reports previously trying donepezil and had difficulty tolerating, denies previously trying memantine.   Consult visit 04/24/2020 Dr. Marjory Lies: 77 year old female here for evaluation memory loss. Patient has had 6 months of mild short-term memory loss, losing things, forgetting recent conversations. No major changes in ADLs. She takes care of her own personal hygiene, bathing, dressing, feeding, household chores. She still able to drive, shop, manage finances and operate independently outside the home. She was empirically tried on donepezil and memantine but could not tolerate these because of side effects.   REVIEW OF SYSTEMS: Full 14 system review of systems performed and negative with exception of: As per HPI.  ALLERGIES: Allergies  Allergen Reactions   Aricept [Donepezil]     Bad dreams    HOME MEDICATIONS: Outpatient Medications Prior to Visit  Medication Sig Dispense Refill   acetaminophen (  TYLENOL) 500 MG tablet Take by mouth. 750 mg twice daily     aspirin EC 81 MG tablet Take 81 mg by mouth daily.     Cholecalciferol (VITAMIN D3) 1000 UNITS CAPS Take 1 tablet by mouth 2 (two) times daily.      esomeprazole (NEXIUM) 40 MG capsule Take 40 mg by mouth  daily.     Magnesium 250 MG TABS Take 250 mg by mouth 2 (two) times daily.     OVER THE COUNTER MEDICATION Take 3 tablets by mouth at bedtime. Hyland's Leg Cramps PM     polyethylene glycol (MIRALAX / GLYCOLAX) 17 g packet Take 17 g by mouth daily. 14 each 0   rivastigmine (EXELON) 4.6 mg/24hr Place 1 patch (4.6 mg total) onto the skin daily. 30 patch 12   sertraline (ZOLOFT) 25 MG tablet Take 25 mg by mouth daily.     TOVIAZ 8 MG TB24 tablet Take 8 mg by mouth daily.     traZODone (DESYREL) 50 MG tablet Take 25 mg by mouth at bedtime.      No facility-administered medications prior to visit.    PAST MEDICAL HISTORY: Past Medical History:  Diagnosis Date   Arthritis    R knee   Breast cancer (HCC) 2004   chemo   Cancer (HCC)    CKD (chronic kidney disease)    Clot 1996   in the right leg- in the muscle     DVT (deep venous thrombosis) (HCC) 1990's   probably related to positive phlebitis   Factor 5 Leiden mutation, heterozygous (HCC) 11/21/2013   GERD (gastroesophageal reflux disease)    Headache(784.0)    migraines- most often 2-3 times /week    Heterozygous factor V Leiden mutation (HCC) 01/16/2014   Hyperlipidemia    Insomnia    Migraine    Osteoporosis    Photophobia of both eyes    unknown etiology    PAST SURGICAL HISTORY: Past Surgical History:  Procedure Laterality Date   BREAST LUMPECTOMY  02/21/2003   BREAST SURGERY  1987   right lumpectomy for benign disease   EYE SURGERY     cataracts removed -IOL   KNEE SURGERY     MASTECTOMY Left 2004   L parial mastectomy   SENTINEL LYMPH NODE BIOPSY  02/21/2003   SHOULDER SURGERY Right 03/2010   rotator cuff   TONSILLECTOMY     TOTAL KNEE ARTHROPLASTY Right 08/09/2013   Procedure: TOTAL KNEE ARTHROPLASTY;  Surgeon: Velna Ochs, MD;  Location: [facility] OR;  Service: Orthopedics;  Laterality: Right;   TUBAL LIGATION      FAMILY HISTORY: Family History  Problem Relation Age of Onset   Heart disease Father      SOCIAL HISTORY: Social History   Socioeconomic History   Marital status: Married    Spouse name: Molly Maduro   Number of children: 1   Years of education: 12   Highest education level: Not on file  Occupational History   Occupation: collections  Tobacco Use   Smoking status: Never   Smokeless tobacco: Never  Substance and Sexual Activity   Alcohol use: Never    Alcohol/week: 0.0 standard drinks of alcohol   Drug use: Never   Sexual activity: Not on file  Other Topics Concern   Not on file  Social History Narrative   Lives with husband   No caffiene   Social Determinants of Health   Financial Resource Strain: Not on file  Food Insecurity: Not on  file  Transportation Needs: Not on file  Physical Activity: Not on file  Stress: Not on file  Social Connections: Unknown (12/08/2021)   Received from [facility], [facility]   Social Network    Social Network: Not on file  Intimate Partner Violence: Unknown (10/30/2021)   Received from [facility], [facility]   HITS    Physically Hurt: Not on file    Insult or Talk Down To: Not on file    Threaten Physical Harm: Not on file    Scream or Curse: Not on file     PHYSICAL EXAM  GENERAL EXAM/CONSTITUTIONAL: Vitals:  Vitals:   04/14/23 1441  BP: 126/70  Pulse: 75  Weight: 137 lb 3.2 oz (62.2 kg)  Height: 5\' 3"  (1.6 m)      Body mass index is 24.3 kg/m. Wt Readings from Last 3 Encounters:  04/14/23 137 lb 3.2 oz (62.2 kg)  10/08/22 122 lb 9.6 oz (55.6 kg)  06/02/22 125 lb (56.7 kg)   Patient is in no distress; well developed, nourished and groomed; neck is supple  CARDIOVASCULAR: Examination of carotid arteries is normal; no carotid bruits Regular rate and rhythm, no murmurs Examination of peripheral vascular system by observation and palpation is normal  EYES: Ophthalmoscopic exam of optic discs and posterior segments is normal; no papilledema or hemorrhages No results  found.  MUSCULOSKELETAL: Gait, strength, tone, movements noted in Neurologic exam below  NEUROLOGIC: MENTAL STATUS:     04/14/2023    2:54 PM 10/08/2022    3:30 PM 06/02/2022    1:10 PM  MMSE - Mini Mental State Exam  Orientation to time 1 4 1   Orientation to Place 5 5 3   Registration 3 3 3   Attention/ Calculation 1 1 0  Recall 0 0 0  Language- name 2 objects 2 2 2   Language- repeat 1 1 1   Language- follow 3 step command 1 0 3  Language- read & follow direction 1 1 1   Write a sentence 0 0 1  Copy design 0 0 0  Total score 15 17 15    awake, alert recent memory impaired and remote memory intact normal attention and concentration language fluent, comprehension intact, naming intact fund of knowledge appropriate  CRANIAL NERVE:  2nd - no papilledema on fundoscopic exam 2nd, 3rd, 4th, 6th - pupils equal and reactive to light, visual fields full to confrontation, extraocular muscles intact, no nystagmus 5th - facial sensation symmetric 7th - facial strength symmetric 8th - hearing intact 9th - palate elevates symmetrically, uvula midline 11th - shoulder shrug symmetric 12th - tongue protrusion midline  MOTOR:  normal bulk and tone, full strength in the BUE, BLE  SENSORY:  normal and symmetric to light touch, temperature, vibration  COORDINATION:  finger-nose-finger, fine finger movements normal  REFLEXES:  deep tendon reflexes present and symmetric  GAIT/STATION:  narrow based gait     DIAGNOSTIC DATA (LABS, IMAGING, TESTING) - I reviewed patient records, labs, notes, testing and imaging myself where available.  Lab Results  Component Value Date   WBC 8.5 06/02/2022   HGB 12.4 06/02/2022   HCT 37.9 06/02/2022   MCV 87 06/02/2022   PLT 354 06/02/2022      Component Value Date/Time   NA 142 06/02/2022 1351   NA 143 05/30/2013 1040   K 4.1 06/02/2022 1351   K 4.6 05/30/2013 1040   CL 97 06/02/2022 1351   CL 105 05/05/2012 1058   CO2 25 06/02/2022  1351  CO2 27 05/30/2013 1040   GLUCOSE 93 06/02/2022 1351   GLUCOSE 155 (H) 03/07/2022 1139   GLUCOSE 98 05/30/2013 1040   GLUCOSE 93 05/05/2012 1058   BUN 9 06/02/2022 1351   BUN 14.4 05/30/2013 1040   CREATININE 1.09 (H) 06/02/2022 1351   CREATININE 1.1 05/30/2013 1040   CALCIUM 9.8 06/02/2022 1351   CALCIUM 10.1 05/30/2013 1040   PROT 7.1 06/02/2022 1351   PROT 7.7 05/30/2013 1040   ALBUMIN 4.8 06/02/2022 1351   ALBUMIN 3.8 05/30/2013 1040   AST 19 06/02/2022 1351   AST 26 05/30/2013 1040   ALT 13 06/02/2022 1351   ALT 22 05/30/2013 1040   ALKPHOS 81 06/02/2022 1351   ALKPHOS 88 05/30/2013 1040   BILITOT 0.5 06/02/2022 1351   BILITOT 0.59 05/30/2013 1040   GFRNONAA >60 03/07/2022 1139   GFRAA 72 (L) 11/15/2013 0630   Lab Results  Component Value Date   CHOL 150 05/27/2010   HDL 50 05/27/2010   LDLCALC 87 05/27/2010   TRIG 63 05/27/2010   CHOLHDL 3.0 05/27/2010   No results found for: "HGBA1C" Lab Results  Component Value Date   VITAMINB12 820 06/02/2022   Lab Results  Component Value Date   TSH 2.020 06/02/2022     03/08/19 MRI brain - Mild biparietal atrophy.  - Otherwise normal.      ASSESSMENT AND PLAN  77 y.o. year old female here with mild memory loss since early 2021. No major changes in ADLs. Could represent mild cognitive impairment.   Dx:  1. Mild neurocognitive disorder      PLAN:  MILD MEMORY LOSS (possible MCI vs mild prodromal dementia) -MMSE today 15/30 (prior 17/30) -Continue Exelon 4.6 mg transdermal patch daily, as cognition stable, will continue current dose. Can consider increase if cognition starts to decline -Difficulty tolerating memantine and donepezil -Discussed importance of increasing physical activity and mental stimulation and social activities, ensuring adequate sleep and healthy diet -Would recommend follow-up with PCP with suspected underlying depression/anxiety for further treatment    Return in about 6  months (around 10/12/2023).   I spent 30 minutes of face-to-face and non-face-to-face time with patient and husband.  This included previsit chart review, lab review, study review, order entry, electronic health record documentation, patient and husband education and discussion regarding above diagnoses and treatment plan and answered all other questions to patient and husband satisfaction  Ihor Austin, AGNP-BC  [facility] [address] [place], [place] [address]  Phone [phone] Fax [phone] Note: This document was prepared with digital dictation and possible smart phrase technology. Any transcriptional errors that result from this process are unintentional.

## 2023-04-14 NOTE — Patient Instructions (Addendum)
Continue Exelon patch nightly - please call with any worsening memory and can consider increasing dosage if needed  Increase daytime physical and cognitive activities at home, ensure good sleep and a health diet  Consider completing an MRI brain which can further evaluate causes of memory difficulties    Follow up in 6 months or call earlier if needed

## 2023-05-04 DIAGNOSIS — H60391 Other infective otitis externa, right ear: Secondary | ICD-10-CM | POA: Diagnosis not present

## 2023-05-15 DIAGNOSIS — Z0000 Encounter for general adult medical examination without abnormal findings: Secondary | ICD-10-CM | POA: Diagnosis not present

## 2023-05-15 DIAGNOSIS — H60391 Other infective otitis externa, right ear: Secondary | ICD-10-CM | POA: Diagnosis not present

## 2023-05-26 DIAGNOSIS — H35371 Puckering of macula, right eye: Secondary | ICD-10-CM | POA: Diagnosis not present

## 2023-05-26 DIAGNOSIS — B070 Plantar wart: Secondary | ICD-10-CM | POA: Diagnosis not present

## 2023-06-03 DIAGNOSIS — L84 Corns and callosities: Secondary | ICD-10-CM | POA: Diagnosis not present

## 2023-06-03 DIAGNOSIS — B078 Other viral warts: Secondary | ICD-10-CM | POA: Diagnosis not present

## 2023-06-15 ENCOUNTER — Telehealth: Payer: Self-pay | Admitting: Adult Health

## 2023-06-15 NOTE — Telephone Encounter (Signed)
Pt's husband said wife has dementia and is insisting on speaking with Ihor Austin, NP.  Do not know why she wants to speak her. Would like a call back.

## 2023-06-16 NOTE — Telephone Encounter (Addendum)
Pt's husband called again very [place] demanding a call back. I informed him that there was a note put in yesterday regarding his call and that he was not being ignored. I informed him that the nurse has 24-48 hrs to call back. Pt started insulting me and the company and I told him that I will send the message and to have a good day. I ended call.

## 2023-06-16 NOTE — Telephone Encounter (Signed)
I agree with your recommendation.  Thank you for handling.

## 2023-06-16 NOTE — Telephone Encounter (Signed)
I called home #, patient picked up. I told her I was calling from Jessica's office.  I asked her if we could help her with something.  She said no.  I told her that was calling from a message left from her husband.  I called her husband. He said that pt would get fixated on something and not let it go.  For the last 2-3 days would have episodes/ get upset. Frustration would ensue and then arguments.  I relayed that acute changes in behavior / confusion can be related to underlying infections (like UTI).  He stated that pt did see pcp after we saw her in September.  I did tell him that I did call and speak to her and she said nothing was wrong.  He requested that I call her again.  I spoke to pt she says that he has taken her dresses and clothes away.  She feels like things will be taken off her bedside table at night.  She did not want her son to be told about this.  Pt did call back again,  and asking if her husband will be given the information.  I told her that if he calls we do have the signed DPR that we can speak to both her son and husband about her care.  We would do that to take care of her.  She has dementia and this disease is progressive.   I recommended that she see pcp for checking for underlying infections and other treatments for anxiety.  I did call husband and relayed that felt due to her anxiety and change in her behavior that see pcp   He said ok.  Appreciated call back.  I did not speak to son.

## 2023-06-24 DIAGNOSIS — F0390 Unspecified dementia without behavioral disturbance: Secondary | ICD-10-CM | POA: Diagnosis not present

## 2023-06-24 DIAGNOSIS — F320 Major depressive disorder, single episode, mild: Secondary | ICD-10-CM | POA: Diagnosis not present

## 2023-06-24 DIAGNOSIS — F419 Anxiety disorder, unspecified: Secondary | ICD-10-CM | POA: Diagnosis not present

## 2023-06-24 DIAGNOSIS — Z23 Encounter for immunization: Secondary | ICD-10-CM | POA: Diagnosis not present

## 2023-06-24 DIAGNOSIS — G4700 Insomnia, unspecified: Secondary | ICD-10-CM | POA: Diagnosis not present

## 2023-09-08 DIAGNOSIS — M545 Low back pain, unspecified: Secondary | ICD-10-CM | POA: Diagnosis not present

## 2023-09-25 DIAGNOSIS — S39012A Strain of muscle, fascia and tendon of lower back, initial encounter: Secondary | ICD-10-CM | POA: Diagnosis not present

## 2023-09-28 ENCOUNTER — Emergency Department (HOSPITAL_BASED_OUTPATIENT_CLINIC_OR_DEPARTMENT_OTHER)
Admission: EM | Admit: 2023-09-28 | Discharge: 2023-09-28 | Disposition: A | Discharge status: Home / Self Care | Source: Home / Self Care | Attending: Emergency Medicine | Admitting: Emergency Medicine

## 2023-09-28 ENCOUNTER — Other Ambulatory Visit: Payer: Self-pay

## 2023-09-28 ENCOUNTER — Encounter (HOSPITAL_BASED_OUTPATIENT_CLINIC_OR_DEPARTMENT_OTHER): Payer: Self-pay | Admitting: *Deleted

## 2023-09-28 DIAGNOSIS — M545 Low back pain, unspecified: Secondary | ICD-10-CM | POA: Insufficient documentation

## 2023-09-28 DIAGNOSIS — M5459 Other low back pain: Secondary | ICD-10-CM | POA: Diagnosis not present

## 2023-09-28 DIAGNOSIS — K255 Chronic or unspecified gastric ulcer with perforation: Secondary | ICD-10-CM | POA: Diagnosis not present

## 2023-09-28 DIAGNOSIS — K631 Perforation of intestine (nontraumatic): Secondary | ICD-10-CM | POA: Diagnosis not present

## 2023-09-28 DIAGNOSIS — F0390 Unspecified dementia without behavioral disturbance: Secondary | ICD-10-CM | POA: Insufficient documentation

## 2023-09-28 MED ORDER — HYDROCODONE-ACETAMINOPHEN 5-325 MG PO TABS
2.0000 | ORAL_TABLET | Freq: Once | ORAL | Status: AC
  Administered 2023-09-28: 2 via ORAL
  Filled 2023-09-28: qty 2

## 2023-09-28 MED ORDER — HYDROCODONE-ACETAMINOPHEN 5-325 MG PO TABS: 1.0000 | ORAL_TABLET | ORAL | 0 refills | Status: DC | PRN

## 2023-09-28 MED ORDER — KETOROLAC TROMETHAMINE 60 MG/2ML IM SOLN
30.0000 mg | Freq: Once | INTRAMUSCULAR | Status: AC
  Administered 2023-09-28: 30 mg via INTRAMUSCULAR
  Filled 2023-09-28: qty 2

## 2023-09-28 NOTE — Discharge Instructions (Signed)
 Please follow-up with your primary doctor and orthopedics team for further management of your back pain.  Please use the pain medicine to help with discomfort and be careful using with other medications as we discussed.  Please rest and stay hydrated.  If any symptoms change or worsen acutely, return to the nearest emergency department.

## 2023-09-28 NOTE — ED Triage Notes (Addendum)
 C/o lower back pain that has been ongoing since the first of Feb. Has been seen by ortho on Friday and states she received 2 "shots" that have not helped. Was given meloxicam and methocarbamol as needed. States this has also not helped her pain.  Is scheduled for PT this week. States pain comes and goes. Denies any new injury. Denies any urinary symptoms. States this pain is consistent with her back pain. Took tylenol at 5am. Ortho states xray showed scoliosis and arthritis.

## 2023-09-28 NOTE — ED Provider Notes (Signed)
 [place] EMERGENCY DEPARTMENT AT [facility] Provider Note   CSN: [id number] Arrival date & time: 09/28/23  0554     History  Chief Complaint  Patient presents with   Back Pain    Samantha Ali is a 78 y.o. female.  Patient is a 78 year old female with past medical history of dementia, GERD, prior DVT, and recurrent low back pain.  Patient presenting today with complaints of low back pain.  This has caused her problems intermittently for several years.  Most recently, the pain began in the beginning of February and has been lingering for the past month.  This began in the absence of any injury or trauma.  She was seen by her orthopedic doctor on Friday and given what sounds like steroid injections.  She was discharged with Robaxin and meloxicam.  She has been taking these medications, but having little relief.  She is having difficulty ambulating secondary to the pain.  She denies any weakness or numbness.  She denies any bowel or bladder complaints.  The history is provided by the patient.       Home Medications Prior to Admission medications   Medication Sig Start Date End Date Taking? Authorizing Provider  meloxicam (MOBIC) 15 MG tablet Take 15 mg by mouth daily.   Yes [provider]  methocarbamol (ROBAXIN) 500 MG tablet Take 500 mg by mouth 4 (four) times daily.   Yes [provider]  acetaminophen (TYLENOL) 500 MG tablet Take by mouth. 750 mg twice daily    [provider]  aspirin EC 81 MG tablet Take 81 mg by mouth daily.    [provider]  Cholecalciferol (VITAMIN D3) 1000 UNITS CAPS Take 1 tablet by mouth 2 (two) times daily.     [provider]  esomeprazole (NEXIUM) 40 MG capsule Take 40 mg by mouth daily. 03/22/20   [provider]  Magnesium 250 MG TABS Take 250 mg by mouth 2 (two) times daily.    [provider]  OVER THE COUNTER MEDICATION Take 3 tablets by mouth at bedtime. Hyland's Leg Cramps  PM 03/31/13   [provider]  polyethylene glycol (MIRALAX / GLYCOLAX) 17 g packet Take 17 g by mouth daily. 03/07/22   Cecil Cobbs, PA-C  rivastigmine (EXELON) 4.6 mg/24hr Place 1 patch (4.6 mg total) onto the skin daily. 10/08/22   Ihor Austin, NP  sertraline (ZOLOFT) 25 MG tablet Take 25 mg by mouth daily.    [provider]  TOVIAZ 8 MG TB24 tablet Take 8 mg by mouth daily. 04/04/20   [provider]  traZODone (DESYREL) 50 MG tablet Take 25 mg by mouth at bedtime.           Allergies    Aricept [donepezil]    Review of Systems   Review of Systems  All other systems reviewed and are negative.   Physical Exam Updated Vital Signs BP (!) 156/89 (BP Location: Right Arm)   Pulse 88   Temp 97.6 F (36.4 C) (Oral)   Resp 16   Ht 5\' 3"  (1.6 m)   Wt 59 kg   SpO2 96%   BMI 23.03 kg/m  Physical Exam Vitals and nursing note reviewed.  Constitutional:      Appearance: Normal appearance.  HENT:     Head: Normocephalic.  Pulmonary:     Effort: Pulmonary effort is normal.  Musculoskeletal:     Cervical back: Normal range of motion.  Comments: There is tenderness to palpation in the soft tissues of the lower lumbar region.  There is no bony tenderness or step-off.  Skin:    General: Skin is warm and dry.  Neurological:     Mental Status: She is alert.     Comments: Patient is awake and alert.  She answers questions appropriately.  Strength is 5 out of 5 in both lower extremities.  DTRs are 1+ and symmetrical in the patellar and Achilles tendons bilaterally.     ED Results / Procedures / Treatments   Labs (all labs ordered are listed, but only abnormal results are displayed) Labs Reviewed - No data to display  EKG None  Radiology No results found.  Procedures Procedures    Medications Ordered in ED Medications  ketorolac (TORADOL) injection 30 mg (has no administration in time range)  HYDROcodone-acetaminophen (NORCO/VICODIN)  5-325 MG per tablet 2 tablet (has no administration in time range)    ED Course/ Medical Decision Making/ A&P  Patient presenting with low back pain as described in the HPI.  Mobic and Robaxin are not helping.  Patient arrives with stable vital signs and is afebrile.  And her physical examination and history failed to reveal any red flags that would suggest an emergent situation.  She was seen by orthopedics on Friday and had x-rays that were reported to have shown no acute process.  I do not feel as though additional imaging is indicated as she just had x-rays 2 days ago.  Patient will be given 30 mg of IM Toradol and 2 Vicodin for pain.  We will see how she responds to these medications.  Care will be signed out to oncoming provider at shift change.  Final Clinical Impression(s) / ED Diagnoses Final diagnoses:  None    Rx / DC Orders ED Discharge Orders     None         Geoffery Lyons, MD 09/28/23 [phone]

## 2023-09-28 NOTE — ED Provider Notes (Signed)
 7:01 AM Care assumed from Dr. Judd Lien.  At time of transfer of care, patient awaiting reassessment after the Toradol shot and pain medicine.  If patient started feel better, plan of care is to give her restriction for some pain medicine and have her discharge and follow-up with her orthopedics team.  As there is no new trauma or injury since the most recent imaging that she was told was reassuring, previous team felt she did not need new imaging at this time.  Anticipate reassessment to determine disposition.  7:28 AM Patient reassessed and she is feeling much better.  She would like to go home.  They understand return precautions and follow-up instructions with her orthopedics and PCP teams.  We discussed medication management and use.  They agreed with plan of care and had no other questions or concerns.  Patient discharged in good condition.  Clinical Impression: 1. Midline low back pain without sciatica, unspecified chronicity     Disposition: Discharge  Condition: Good  I have discussed the results, Dx and Tx plan with the pt(& family if present). He/she/they expressed understanding and agree(s) with the plan. Discharge instructions discussed at great length. Strict return precautions discussed and pt &/or family have verbalized understanding of the instructions. No further questions at time of discharge.    Current Discharge Medication List     START taking these medications   Details  HYDROcodone-acetaminophen (NORCO/VICODIN) 5-325 MG tablet Take 1 tablet by mouth every 4 (four) hours as needed. Qty: 15 tablet, Refills: 0        Follow Up: No follow-up provider specified.    Jaymarie Yeakel, Canary Brim, MD 09/28/23 0730

## 2023-09-29 ENCOUNTER — Other Ambulatory Visit: Payer: Self-pay

## 2023-09-29 ENCOUNTER — Inpatient Hospital Stay (HOSPITAL_BASED_OUTPATIENT_CLINIC_OR_DEPARTMENT_OTHER)
Admission: EM | Admit: 2023-09-29 | Discharge: 2023-10-07 | DRG: 326 | Disposition: A | Discharge status: Home Health | Attending: Surgery | Admitting: Surgery

## 2023-09-29 ENCOUNTER — Emergency Department (HOSPITAL_BASED_OUTPATIENT_CLINIC_OR_DEPARTMENT_OTHER)

## 2023-09-29 ENCOUNTER — Emergency Department (HOSPITAL_BASED_OUTPATIENT_CLINIC_OR_DEPARTMENT_OTHER): Admitting: Radiology

## 2023-09-29 ENCOUNTER — Encounter (HOSPITAL_COMMUNITY): Admission: EM | Disposition: A | Discharge status: Home / Self Care | Payer: Self-pay | Source: Home / Self Care

## 2023-09-29 DIAGNOSIS — Z452 Encounter for adjustment and management of vascular access device: Secondary | ICD-10-CM | POA: Diagnosis not present

## 2023-09-29 DIAGNOSIS — A419 Sepsis, unspecified organism: Secondary | ICD-10-CM | POA: Diagnosis not present

## 2023-09-29 DIAGNOSIS — Z9889 Other specified postprocedural states: Secondary | ICD-10-CM

## 2023-09-29 DIAGNOSIS — Z7901 Long term (current) use of anticoagulants: Secondary | ICD-10-CM | POA: Diagnosis not present

## 2023-09-29 DIAGNOSIS — F03918 Unspecified dementia, unspecified severity, with other behavioral disturbance: Secondary | ICD-10-CM | POA: Diagnosis not present

## 2023-09-29 DIAGNOSIS — F05 Delirium due to known physiological condition: Secondary | ICD-10-CM | POA: Insufficient documentation

## 2023-09-29 DIAGNOSIS — K449 Diaphragmatic hernia without obstruction or gangrene: Secondary | ICD-10-CM | POA: Diagnosis not present

## 2023-09-29 DIAGNOSIS — Z79899 Other long term (current) drug therapy: Secondary | ICD-10-CM

## 2023-09-29 DIAGNOSIS — N189 Chronic kidney disease, unspecified: Secondary | ICD-10-CM | POA: Diagnosis not present

## 2023-09-29 DIAGNOSIS — R079 Chest pain, unspecified: Secondary | ICD-10-CM | POA: Diagnosis not present

## 2023-09-29 DIAGNOSIS — R0602 Shortness of breath: Secondary | ICD-10-CM | POA: Diagnosis not present

## 2023-09-29 DIAGNOSIS — Z7982 Long term (current) use of aspirin: Secondary | ICD-10-CM

## 2023-09-29 DIAGNOSIS — K219 Gastro-esophageal reflux disease without esophagitis: Secondary | ICD-10-CM | POA: Diagnosis present

## 2023-09-29 DIAGNOSIS — N179 Acute kidney failure, unspecified: Secondary | ICD-10-CM | POA: Diagnosis not present

## 2023-09-29 DIAGNOSIS — Z853 Personal history of malignant neoplasm of breast: Secondary | ICD-10-CM

## 2023-09-29 DIAGNOSIS — R6520 Severe sepsis without septic shock: Secondary | ICD-10-CM | POA: Diagnosis not present

## 2023-09-29 DIAGNOSIS — J9811 Atelectasis: Secondary | ICD-10-CM | POA: Diagnosis not present

## 2023-09-29 DIAGNOSIS — K631 Perforation of intestine (nontraumatic): Principal | ICD-10-CM | POA: Diagnosis present

## 2023-09-29 DIAGNOSIS — Z791 Long term (current) use of non-steroidal anti-inflammatories (NSAID): Secondary | ICD-10-CM

## 2023-09-29 DIAGNOSIS — D6851 Activated protein C resistance: Secondary | ICD-10-CM | POA: Diagnosis present

## 2023-09-29 DIAGNOSIS — Z8249 Family history of ischemic heart disease and other diseases of the circulatory system: Secondary | ICD-10-CM | POA: Diagnosis not present

## 2023-09-29 DIAGNOSIS — F03B0 Unspecified dementia, moderate, without behavioral disturbance, psychotic disturbance, mood disturbance, and anxiety: Secondary | ICD-10-CM | POA: Diagnosis not present

## 2023-09-29 DIAGNOSIS — Z781 Physical restraint status: Secondary | ICD-10-CM

## 2023-09-29 DIAGNOSIS — I129 Hypertensive chronic kidney disease with stage 1 through stage 4 chronic kidney disease, or unspecified chronic kidney disease: Secondary | ICD-10-CM | POA: Diagnosis not present

## 2023-09-29 DIAGNOSIS — Z8672 Personal history of thrombophlebitis: Secondary | ICD-10-CM

## 2023-09-29 DIAGNOSIS — D631 Anemia in chronic kidney disease: Secondary | ICD-10-CM | POA: Diagnosis present

## 2023-09-29 DIAGNOSIS — M810 Age-related osteoporosis without current pathological fracture: Secondary | ICD-10-CM | POA: Diagnosis not present

## 2023-09-29 DIAGNOSIS — K265 Chronic or unspecified duodenal ulcer with perforation: Secondary | ICD-10-CM | POA: Diagnosis not present

## 2023-09-29 DIAGNOSIS — E785 Hyperlipidemia, unspecified: Secondary | ICD-10-CM | POA: Diagnosis not present

## 2023-09-29 DIAGNOSIS — I10 Essential (primary) hypertension: Secondary | ICD-10-CM | POA: Diagnosis not present

## 2023-09-29 DIAGNOSIS — Z4682 Encounter for fitting and adjustment of non-vascular catheter: Secondary | ICD-10-CM | POA: Diagnosis not present

## 2023-09-29 DIAGNOSIS — Z96651 Presence of right artificial knee joint: Secondary | ICD-10-CM | POA: Diagnosis not present

## 2023-09-29 DIAGNOSIS — K650 Generalized (acute) peritonitis: Secondary | ICD-10-CM | POA: Diagnosis not present

## 2023-09-29 DIAGNOSIS — R109 Unspecified abdominal pain: Secondary | ICD-10-CM | POA: Diagnosis not present

## 2023-09-29 DIAGNOSIS — K668 Other specified disorders of peritoneum: Secondary | ICD-10-CM | POA: Diagnosis not present

## 2023-09-29 DIAGNOSIS — R0989 Other specified symptoms and signs involving the circulatory and respiratory systems: Secondary | ICD-10-CM | POA: Diagnosis not present

## 2023-09-29 DIAGNOSIS — K255 Chronic or unspecified gastric ulcer with perforation: Secondary | ICD-10-CM | POA: Diagnosis not present

## 2023-09-29 DIAGNOSIS — R0789 Other chest pain: Secondary | ICD-10-CM | POA: Diagnosis not present

## 2023-09-29 DIAGNOSIS — R5381 Other malaise: Secondary | ICD-10-CM | POA: Diagnosis present

## 2023-09-29 DIAGNOSIS — Z9012 Acquired absence of left breast and nipple: Secondary | ICD-10-CM

## 2023-09-29 DIAGNOSIS — H53143 Visual discomfort, bilateral: Secondary | ICD-10-CM | POA: Diagnosis present

## 2023-09-29 DIAGNOSIS — Z86718 Personal history of other venous thrombosis and embolism: Secondary | ICD-10-CM | POA: Diagnosis not present

## 2023-09-29 DIAGNOSIS — K659 Peritonitis, unspecified: Secondary | ICD-10-CM | POA: Diagnosis not present

## 2023-09-29 DIAGNOSIS — Z978 Presence of other specified devices: Secondary | ICD-10-CM | POA: Diagnosis present

## 2023-09-29 DIAGNOSIS — R112 Nausea with vomiting, unspecified: Secondary | ICD-10-CM | POA: Diagnosis not present

## 2023-09-29 DIAGNOSIS — R0682 Tachypnea, not elsewhere classified: Secondary | ICD-10-CM | POA: Diagnosis present

## 2023-09-29 DIAGNOSIS — I700 Atherosclerosis of aorta: Secondary | ICD-10-CM | POA: Diagnosis not present

## 2023-09-29 DIAGNOSIS — E440 Moderate protein-calorie malnutrition: Secondary | ICD-10-CM | POA: Diagnosis present

## 2023-09-29 DIAGNOSIS — E876 Hypokalemia: Secondary | ICD-10-CM | POA: Diagnosis present

## 2023-09-29 DIAGNOSIS — K261 Acute duodenal ulcer with perforation: Secondary | ICD-10-CM | POA: Diagnosis not present

## 2023-09-29 LAB — CBC
HCT: 42.4 % (ref 36.0–46.0)
Hemoglobin: 14.2 g/dL (ref 12.0–15.0)
MCH: 30.5 pg (ref 26.0–34.0)
MCHC: 33.5 g/dL (ref 30.0–36.0)
MCV: 91.2 fL (ref 80.0–100.0)
Platelets: 338 10*3/uL (ref 150–400)
RBC: 4.65 MIL/uL (ref 3.87–5.11)
RDW: 13.4 % (ref 11.5–15.5)
WBC: 9.1 10*3/uL (ref 4.0–10.5)
nRBC: 0 % (ref 0.0–0.2)

## 2023-09-29 LAB — COMPREHENSIVE METABOLIC PANEL
ALT: 10 U/L (ref 0–44)
AST: 11 U/L — ABNORMAL LOW (ref 15–41)
Albumin: 4.6 g/dL (ref 3.5–5.0)
Alkaline Phosphatase: 101 U/L (ref 38–126)
Anion gap: 12 (ref 5–15)
BUN: 33 mg/dL — ABNORMAL HIGH (ref 8–23)
CO2: 25 mmol/L (ref 22–32)
Calcium: 10.1 mg/dL (ref 8.9–10.3)
Chloride: 98 mmol/L (ref 98–111)
Creatinine, Ser: 1.2 mg/dL — ABNORMAL HIGH (ref 0.44–1.00)
GFR, Estimated: 46 mL/min — ABNORMAL LOW (ref >60)
Glucose, Bld: 126 mg/dL — ABNORMAL HIGH (ref 70–99)
Potassium: 3.7 mmol/L (ref 3.5–5.1)
Sodium: 135 mmol/L (ref 135–145)
Total Bilirubin: 0.6 mg/dL (ref 0.0–1.2)
Total Protein: 7.3 g/dL (ref 6.5–8.1)

## 2023-09-29 LAB — URINALYSIS, ROUTINE W REFLEX MICROSCOPIC
Bacteria, UA: NONE SEEN
Bilirubin Urine: NEGATIVE
Glucose, UA: NEGATIVE mg/dL
Ketones, ur: NEGATIVE mg/dL
Leukocytes,Ua: NEGATIVE
Nitrite: NEGATIVE
Specific Gravity, Urine: >1.046 — ABNORMAL HIGH (ref 1.005–1.030)
pH: 6 (ref 5.0–8.0)

## 2023-09-29 LAB — LIPASE, BLOOD: Lipase: 20 U/L (ref 11–51)

## 2023-09-29 LAB — LACTIC ACID, PLASMA: Lactic Acid, Venous: 1.5 mmol/L (ref 0.5–1.9)

## 2023-09-29 LAB — TROPONIN I (HIGH SENSITIVITY)
Troponin I (High Sensitivity): 4 ng/L (ref <18)
Troponin I (High Sensitivity): 3 ng/L (ref <18)

## 2023-09-29 SURGERY — LAPAROSCOPY, DIAGNOSTIC
Anesthesia: General

## 2023-09-29 MED ORDER — CEFEPIME HCL 1 G IJ SOLR
INTRAMUSCULAR | Status: AC
  Filled 2023-09-29: qty 10

## 2023-09-29 MED ORDER — FENTANYL CITRATE PF 50 MCG/ML IJ SOSY
25.0000 ug | PREFILLED_SYRINGE | Freq: Once | INTRAMUSCULAR | Status: AC
  Administered 2023-09-29: 25 ug via INTRAVENOUS
  Filled 2023-09-29: qty 1

## 2023-09-29 MED ORDER — HEPARIN SODIUM (PORCINE) 5000 UNIT/ML IJ SOLN: 5000.0000 [IU] | Freq: Three times a day (TID) | INTRAMUSCULAR | Status: DC

## 2023-09-29 MED ORDER — METRONIDAZOLE 500 MG/100ML IV SOLN
500.0000 mg | Freq: Once | INTRAVENOUS | Status: AC
  Administered 2023-09-29: 500 mg via INTRAVENOUS
  Filled 2023-09-29: qty 100

## 2023-09-29 MED ORDER — SODIUM CHLORIDE 0.9 % IV BOLUS
1000.0000 mL | Freq: Once | INTRAVENOUS | Status: AC
  Administered 2023-09-29: 1000 mL via INTRAVENOUS

## 2023-09-29 MED ORDER — HYDROMORPHONE HCL 1 MG/ML IJ SOLN
0.5000 mg | Freq: Once | INTRAMUSCULAR | Status: AC
  Administered 2023-09-29: 0.5 mg via INTRAVENOUS
  Filled 2023-09-29: qty 1

## 2023-09-29 MED ORDER — FENTANYL CITRATE PF 50 MCG/ML IJ SOSY
50.0000 ug | PREFILLED_SYRINGE | Freq: Once | INTRAMUSCULAR | Status: AC
  Administered 2023-09-29: 50 ug via INTRAVENOUS
  Filled 2023-09-29: qty 1

## 2023-09-29 MED ORDER — SODIUM CHLORIDE 0.9 % IV BOLUS
500.0000 mL | Freq: Once | INTRAVENOUS | Status: AC
  Administered 2023-09-29: 500 mL via INTRAVENOUS

## 2023-09-29 MED ORDER — SODIUM CHLORIDE 0.9 % IV SOLN
1.0000 g | Freq: Once | INTRAVENOUS | Status: AC
  Administered 2023-09-29: 1 g via INTRAVENOUS

## 2023-09-29 MED ORDER — IOHEXOL 300 MG/ML  SOLN
100.0000 mL | Freq: Once | INTRAMUSCULAR | Status: AC | PRN
Start: 2023-09-29 — End: 2023-09-29
  Administered 2023-09-29: 80 mL via INTRAVENOUS

## 2023-09-29 NOTE — ED Provider Notes (Signed)
 [place] EMERGENCY DEPARTMENT AT [facility] Provider Note   CSN: [id number] Arrival date & time: 09/29/23  1735     History No chief complaint on file.   Samantha Ali is a 78 y.o. female with medical history of osteoporosis, migraines, hyperlipidemia, GERD, DVT, CKD, arthritis, factor V Leiden mutation.  The patient presents to ED for evaluation of multiple complaints.  Patient here with her husband.  Patient has been reports that earlier today the patient was on the toilet when she began to complain of acute onset abdominal pain that is nonfocal in nature.  States that the patient was then taken to [facility] walk-in clinic but provider there was unable to fully assess patient and recommended coming to the ED.  Patient husband reports the patient was seen in the ED on 3/3 for evaluation of low back pain.  The patient is a longstanding history of low back pain.  Patient was provided hydrocodone at discharge.  The patient reports she has not had a bowel movement in quite some time, multiple days.  She denies nausea, vomiting, diarrhea, fevers at home.  She denies any dysuria or flank pain.  Apparently the patient complained of chest pain earlier today to her husband but she denies this to me.  She denies shortness of breath.  The patient is alert and oriented to her baseline mental status.  HPI     Home Medications Prior to Admission medications   Medication Sig Start Date End Date Taking? Authorizing Provider  acetaminophen (TYLENOL) 500 MG tablet Take by mouth. 750 mg twice daily    [provider]  aspirin EC 81 MG tablet Take 81 mg by mouth daily.    [provider]  Cholecalciferol (VITAMIN D3) 1000 UNITS CAPS Take 1 tablet by mouth 2 (two) times daily.     [provider]  esomeprazole (NEXIUM) 40 MG capsule Take 40 mg by mouth daily. 03/22/20   [provider]  HYDROcodone-acetaminophen (NORCO/VICODIN) 5-325 MG tablet Take 1 tablet by mouth  every 4 (four) hours as needed. 09/28/23   Tegeler, Canary Brim, MD  Magnesium 250 MG TABS Take 250 mg by mouth 2 (two) times daily.    [provider]  meloxicam (MOBIC) 15 MG tablet Take 15 mg by mouth daily.    [provider]  methocarbamol (ROBAXIN) 500 MG tablet Take 500 mg by mouth 4 (four) times daily.    [provider]  OVER THE COUNTER MEDICATION Take 3 tablets by mouth at bedtime. Hyland's Leg Cramps PM 03/31/13   [provider]  polyethylene glycol (MIRALAX / GLYCOLAX) 17 g packet Take 17 g by mouth daily. 03/07/22   Cecil Cobbs, PA-C  rivastigmine (EXELON) 4.6 mg/24hr Place 1 patch (4.6 mg total) onto the skin daily. 10/08/22   Ihor Austin, NP  sertraline (ZOLOFT) 25 MG tablet Take 25 mg by mouth daily.    [provider]  TOVIAZ 8 MG TB24 tablet Take 8 mg by mouth daily. 04/04/20   [provider]  traZODone (DESYREL) 50 MG tablet Take 25 mg by mouth at bedtime.           Allergies    Aricept [donepezil]    Review of Systems   Review of Systems  Physical Exam Updated Vital Signs BP (!) 142/92   Pulse 90   Temp 99.6 F (37.6 C)   Resp 20   SpO2 98%  Physical Exam Constitutional:      General:  She is not in acute distress.    Appearance: She is not ill-appearing.  HENT:     Head: Normocephalic and atraumatic.  Eyes:     Extraocular Movements: Extraocular movements intact.     Conjunctiva/sclera: Conjunctivae normal.     Pupils: Pupils are equal, round, and reactive to light.  Cardiovascular:     Rate and Rhythm: Normal rate and regular rhythm.  Pulmonary:     Effort: Pulmonary effort is normal.     Breath sounds: Normal breath sounds. No wheezing.  Abdominal:     General: Abdomen is flat. There is no distension.     Tenderness: There is abdominal tenderness. There is guarding and rebound. There is no right CVA tenderness or left CVA tenderness.  Musculoskeletal:     Cervical back: Normal range of  motion and neck supple. No tenderness.  Skin:    General: Skin is warm and dry.     Capillary Refill: Capillary refill takes less than 2 seconds.  Neurological:     Mental Status: She is alert. Mental status is at baseline.     Comments: Alert to baseline mental status per husband.  CN III through XII intact.  Intact finger-nose, heel-to-shin.  No pronator drift, no slurred speech.  Equal grip strength upper extremities bilaterally.  Equal strength bilateral lower extremities.     ED Results / Procedures / Treatments   Labs (all labs ordered are listed, but only abnormal results are displayed) Labs Reviewed  COMPREHENSIVE METABOLIC PANEL - Abnormal; Notable for the following components:      Result Value   Glucose, Bld 126 (*)    BUN 33 (*)    Creatinine, Ser 1.20 (*)    AST 11 (*)    GFR, Estimated 46 (*)    All other components within normal limits  URINALYSIS, ROUTINE W REFLEX MICROSCOPIC - Abnormal; Notable for the following components:   Specific Gravity, Urine >1.046 (*)    Hgb urine dipstick TRACE (*)    Protein, ur TRACE (*)    All other components within normal limits  CULTURE, BLOOD (ROUTINE X 2)  CULTURE, BLOOD (ROUTINE X 2)  CBC  LIPASE, BLOOD  LACTIC ACID, PLASMA  LACTIC ACID, PLASMA  TROPONIN I (HIGH SENSITIVITY)  TROPONIN I (HIGH SENSITIVITY)    EKG EKG Interpretation Date/Time:  Tuesday September 29 2023 17:58:36 EST Ventricular Rate:  87 PR Interval:  142 QRS Duration:  80 QT Interval:  370 QTC Calculation: 445 R Axis:   74  Text Interpretation: Normal sinus rhythm Nonspecific ST abnormality Abnormal ECG When compared with ECG of 01-Oct-2020 05:26, No significant change was found Confirmed by Ernie Avena ([id number]) on 09/29/2023 9:23:09 PM  Radiology CT ABDOMEN PELVIS W CONTRAST Result Date: 09/29/2023 CLINICAL DATA:  Abdomen pain chest pain nausea vomiting EXAM: CT ABDOMEN AND PELVIS WITH CONTRAST TECHNIQUE: Multidetector CT imaging of the abdomen and  pelvis was performed using the standard protocol following bolus administration of intravenous contrast. RADIATION DOSE REDUCTION: This exam was performed according to the departmental dose-optimization program which includes automated exposure control, adjustment of the mA and/or kV according to patient size and/or use of iterative reconstruction technique. CONTRAST:  80mL OMNIPAQUE IOHEXOL 300 MG/ML  SOLN COMPARISON:  CT 03/07/2022 FINDINGS: Lower chest: Lung bases demonstrate no acute airspace disease. Limited by patient habitus an patient positioning, patient was imaged in the right lateral decubitus position. Moderate hiatal hernia Hepatobiliary: No calcified gallstone or biliary dilatation. Subcentimeter hypodensity in the right hepatic  lobe too small to further characterize Pancreas: Unremarkable. No pancreatic ductal dilatation or surrounding inflammatory changes. Spleen: Normal in size without focal abnormality. Adrenals/Urinary Tract: Adrenal glands are normal. Kidneys show no hydronephrosis. The bladder is slightly thick walled Stomach/Bowel: Stomach nondistended. Wall thickening of the pylorus and duodenal bulb. Small gas collection tracking to the porta hepatis from the duodenal bulb region, coronal series 6, image 66. Wall thickening of the second portion of duodenum, coronal series 6, image 56. Remainder of the small bowel is unremarkable. Large stool burden in the cecum. Nonvisualized appendix Vascular/Lymphatic: No significant vascular findings are present. No enlarged abdominal or pelvic lymph nodes. Reproductive: Uterus and bilateral adnexa are unremarkable. Other: Moderate free air mostly in the upper abdomen with some free air superficial to the left colon. Small volume free fluid in the right upper quadrant. Musculoskeletal: Scoliosis and degenerative changes. No acute osseous abnormality IMPRESSION: 1. Moderate free air within the abdomen consistent with hollow viscus perforation. Small volume  free fluid in the right upper quadrant. Thickened appearance of pylorus and duodenal bulb with small gas tracking to the porta hepatis, suspect that findings are secondary to perforated gastric or duodenal ulcer. Wall thickening of the second portion of duodenum is likely reactive. 2. Moderate hiatal hernia Critical Value/emergent results were called by telephone at the time of interpretation on 09/29/2023 at 9:14 pm to provider [facility] , who verbally acknowledged these results. Electronically Signed   By: Jasmine Pang M.D.   On: 09/29/2023 21:14   DG Chest 2 View Result Date: 09/29/2023 CLINICAL DATA:  Upper chest pain EXAM: CHEST - 2 VIEW COMPARISON:  Chest radiograph dated 08/05/2012 FINDINGS: Low lung volumes with bronchovascular crowding. No focal consolidations. No pleural effusion or pneumothorax. The heart size and mediastinal contours are within normal limits. No acute osseous abnormality. Left axillary surgical clips. IMPRESSION: Low lung volumes with bronchovascular crowding. No focal consolidations. Electronically Signed   By: Agustin Cree M.D.   On: 09/29/2023 20:17    Procedures .Critical Care  Performed by: Al Decant, PA-C Authorized by: Al Decant, PA-C   Critical care provider statement:    Critical care time (minutes):  60   Critical care was necessary to treat or prevent imminent or life-threatening deterioration of the following conditions: Perforated ulcer.   Critical care was time spent personally by me on the following activities:  Blood draw for specimens, development of treatment plan with patient or surrogate, discussions with consultants, discussions with primary provider, evaluation of patient's response to treatment, examination of patient, interpretation of cardiac output measurements, obtaining history from patient or surrogate, ordering and performing treatments and interventions, ordering and review of laboratory studies, ordering and review of  radiographic studies, pulse oximetry, re-evaluation of patient's condition and review of old charts   I assumed direction of critical care for this patient from another provider in my specialty: no     Care discussed with: accepting provider at another facility      Medications Ordered in ED Medications  ceFEPIme (MAXIPIME) 1 g in sodium chloride 0.9 % 100 mL IVPB (1 g Intravenous New Bag/Given 09/29/23 2143)  metroNIDAZOLE (FLAGYL) IVPB 500 mg (has no administration in time range)  ceFEPIme (MAXIPIME) 1 g injection (has no administration in time range)  sodium chloride 0.9 % bolus 500 mL (0 mLs Intravenous Stopped 09/29/23 2020)  fentaNYL (SUBLIMAZE) injection 25 mcg (25 mcg Intravenous Given 09/29/23 1914)  iohexol (OMNIPAQUE) 300 MG/ML solution 100 mL (80 mLs Intravenous  Contrast Given 09/29/23 2037)  fentaNYL (SUBLIMAZE) injection 50 mcg (50 mcg Intravenous Given 09/29/23 2136)  sodium chloride 0.9 % bolus 1,000 mL (1,000 mLs Intravenous New Bag/Given 09/29/23 2140)    ED Course/ Medical Decision Making/ A&P Clinical Course as of 09/29/23 2144  Tue Sep 29, 2023  2113 Perforated bowel. Source most likely pyloric or duodenal ulcer. Thickened with gas tracking to porta-hepatus. Duodenal most likely. [CG]  2122 Perforated gastric or duodenal ulcer. [CG]  2125 St [CG]    Clinical Course User Index [CG] Al Decant, PA-C   Medical Decision Making Amount and/or Complexity of Data Reviewed Labs: ordered. Radiology: ordered.  Risk Prescription drug management.   78 year old female presents for evaluation of abdominal pain.  Please see HPI for further details.  On exam patient is afebrile and nontachycardic.  Her lung sounds are clear bilaterally, she is not epoxy.  Her abdomen has extreme tenderness throughout with rebound and guarding particularly in the right upper quadrant, right lower quadrant.  She has no overlying skin change to her abdomen.  Her mental status is alert to  baseline.  Concern for intra-abdominal process due to patient tenderness.  Will collect CBC, CMP, lipase, urinalysis, CT abdomen pelvis.  Patient also was complaining of intermittent chest pain earlier today with her husband's will collect troponins, chest x-ray, EKG.  Patient pain treated initially with 25 mcg of fentanyl, 500 mL fluid.  Chest x-ray unremarkable.  Troponin 3.  EKG is nonischemic.  Patient CBC without leukocytosis or anemia.  Metabolic panel shows BUN 33, baseline creatinine 1.2, glucose 126, no elevated LFTs.  Urinalysis shows trace hemoglobin and trace protein.  Lipase is within normal limits.  Radiologist called me with critical result.  Patient has perforated duodenal versus gastric ulcer.    Will collect lactic's, blood cultures.  Will start patient on broad-spectrum antibiotics to include metronidazole, cefepime.  Patient provided with 50 mcg of fentanyl for pain.  Given 1 L fluid bolus.  Spoke with Dr. Dossie Der of general surgery.  He would like the patient transferred to [facility], ED for further evaluation.  Reached out to Dr. Anitra Lauth, she will be the accepting physician in the ED.  [facility] contacted at this time for transfer.   Final Clinical Impression(s) / ED Diagnoses Final diagnoses:  Perforated ulcer of intestine [facility])    Rx / DC Orders ED Discharge Orders     None         Clent Ridges 09/29/23 2145    Ernie Avena, MD 09/29/23 2157

## 2023-09-29 NOTE — H&P (Signed)
 Admitting Physician: Hyman Hopes Jeffifer Rabold  Service: General Surgery  CC: Abdominal pain  Subjective   HPI: Samantha Ali is an 78 y.o. female who is here for abdominal pain.  She has been having back pain issues.  She used Meloxicam, ibuprofen and hydrocodone for pain as well as steroid injections recently.  She has been constipated and hasn't had a bowel movement in a few days.  The pain is excruciating.  It is located all over the abdomen, at first worse lower in the abdomen.  Past Medical History:  Diagnosis Date   Arthritis    R knee   Breast cancer (HCC) 2004   chemo   Cancer (HCC)    CKD (chronic kidney disease)    Clot 1996   in the right leg- in the muscle     DVT (deep venous thrombosis) (HCC) 1990's   probably related to positive phlebitis   Factor 5 Leiden mutation, heterozygous (HCC) 11/21/2013   GERD (gastroesophageal reflux disease)    Headache(784.0)    migraines- most often 2-3 times /week    Heterozygous factor V Leiden mutation (HCC) 01/16/2014   Hyperlipidemia    Insomnia    Migraine    Osteoporosis    Photophobia of both eyes    unknown etiology    Past Surgical History:  Procedure Laterality Date   BREAST LUMPECTOMY  02/21/2003   BREAST SURGERY  1987   right lumpectomy for benign disease   EYE SURGERY     cataracts removed -IOL   KNEE SURGERY     MASTECTOMY Left 2004   L parial mastectomy   SENTINEL LYMPH NODE BIOPSY  02/21/2003   SHOULDER SURGERY Right 03/2010   rotator cuff   TONSILLECTOMY     TOTAL KNEE ARTHROPLASTY Right 08/09/2013   Procedure: TOTAL KNEE ARTHROPLASTY;  Surgeon: Velna Ochs, MD;  Location: [facility] OR;  Service: Orthopedics;  Laterality: Right;   TUBAL LIGATION      Family History  Problem Relation Age of Onset   Heart disease Father     Social:  reports that she has never smoked. She has never used smokeless tobacco. She reports that she does not drink alcohol and does not use drugs.  Allergies:  Allergies   Allergen Reactions   Aricept [Donepezil]     Bad dreams    Medications: Current Outpatient Medications  Medication Instructions   acetaminophen (TYLENOL) 500 MG tablet Oral, 750 mg twice daily   aspirin EC 81 mg, Daily   Cholecalciferol (VITAMIN D3) 1000 UNITS CAPS 1 tablet, 2 times daily   esomeprazole (NEXIUM) 40 mg, Oral, Daily   HYDROcodone-acetaminophen (NORCO/VICODIN) 5-325 MG tablet 1 tablet, Oral, Every 4 hours PRN   Magnesium 250 mg, 2 times daily   meloxicam (MOBIC) 15 mg, Daily   methocarbamol (ROBAXIN) 500 mg, 4 times daily   OVER THE COUNTER MEDICATION 3 tablets, Daily at bedtime   polyethylene glycol (MIRALAX / GLYCOLAX) 17 g, Oral, Daily   rivastigmine (EXELON) 4.6 mg, Transdermal, Daily   sertraline (ZOLOFT) 25 mg, Oral, Daily   Toviaz 8 mg, Oral, Daily   traZODone (DESYREL) 25 mg, Daily at bedtime    ROS - all of the below systems have been reviewed with the patient and positives are indicated with bold text General: chills, fever or night sweats Eyes: blurry vision or double vision ENT: epistaxis or sore throat Allergy/Immunology: itchy/watery eyes or nasal congestion Hematologic/Lymphatic: bleeding problems, blood clots or swollen lymph nodes Endocrine: temperature  intolerance or unexpected weight changes Breast: new or changing breast lumps or nipple discharge Resp: cough, shortness of breath, or wheezing CV: chest pain or dyspnea on exertion GI: as per HPI GU: dysuria, trouble voiding, or hematuria MSK: joint pain or joint stiffness Neuro: TIA or stroke symptoms Derm: pruritus and skin lesion changes Psych: anxiety and depression  Objective   PE Blood pressure (!) 139/93, pulse 83, temperature 99.3 F (37.4 C), temperature source Tympanic, resp. rate (!) 26, height 5\' 3"  (1.6 m), weight 59 kg, SpO2 98%. Constitutional: NAD; conversant; no deformities Eyes: Moist conjunctiva; no lid lag; anicteric; PERRL Neck: Trachea midline; no  thyromegaly Lungs: Normal respiratory effort; no tactile fremitus CV: RRR; no palpable thrills; no pitting edema GI: Abd Guarding, severely tender, rebound tenderness MSK: Normal range of motion of extremities; no clubbing/cyanosis Psychiatric: Appropriate affect; alert and oriented x3 Lymphatic: No palpable cervical or axillary lymphadenopathy  Results for orders placed or performed during the hospital encounter of 09/29/23 (from the past 24 hours)  CBC     Status: None   Collection Time: 09/29/23  5:51 PM  Result Value Ref Range   WBC 9.1 4.0 - 10.5 K/uL   RBC 4.65 3.87 - 5.11 MIL/uL   Hemoglobin 14.2 12.0 - 15.0 g/dL   HCT 40.9 81.1 - 91.4 %   MCV 91.2 80.0 - 100.0 fL   MCH 30.5 26.0 - 34.0 pg   MCHC 33.5 30.0 - 36.0 g/dL   RDW 78.2 95.6 - 21.3 %   Platelets 338 150 - 400 K/uL   nRBC 0.0 0.0 - 0.2 %  Troponin I (High Sensitivity)     Status: None   Collection Time: 09/29/23  5:51 PM  Result Value Ref Range   Troponin I (High Sensitivity) 4 <18 ng/L  Comprehensive metabolic panel     Status: Abnormal   Collection Time: 09/29/23  5:51 PM  Result Value Ref Range   Sodium 135 135 - 145 mmol/L   Potassium 3.7 3.5 - 5.1 mmol/L   Chloride 98 98 - 111 mmol/L   CO2 25 22 - 32 mmol/L   Glucose, Bld 126 (H) 70 - 99 mg/dL   BUN 33 (H) 8 - 23 mg/dL   Creatinine, Ser 0.86 (H) 0.44 - 1.00 mg/dL   Calcium 57.8 8.9 - 46.9 mg/dL   Total Protein 7.3 6.5 - 8.1 g/dL   Albumin 4.6 3.5 - 5.0 g/dL   AST 11 (L) 15 - 41 U/L   ALT 10 0 - 44 U/L   Alkaline Phosphatase 101 38 - 126 U/L   Total Bilirubin 0.6 0.0 - 1.2 mg/dL   GFR, Estimated 46 (L) >60 mL/min   Anion gap 12 5 - 15  Lipase, blood     Status: None   Collection Time: 09/29/23  5:51 PM  Result Value Ref Range   Lipase 20 11 - 51 U/L  Troponin I (High Sensitivity)     Status: None   Collection Time: 09/29/23  8:21 PM  Result Value Ref Range   Troponin I (High Sensitivity) 3 <18 ng/L  Urinalysis, Routine w reflex microscopic  -Urine, Clean Catch     Status: Abnormal   Collection Time: 09/29/23  9:16 PM  Result Value Ref Range   Color, Urine YELLOW YELLOW   APPearance CLEAR CLEAR   Specific Gravity, Urine >1.046 (H) 1.005 - 1.030   pH 6.0 5.0 - 8.0   Glucose, UA NEGATIVE NEGATIVE mg/dL   Hgb urine dipstick  TRACE (A) NEGATIVE   Bilirubin Urine NEGATIVE NEGATIVE   Ketones, ur NEGATIVE NEGATIVE mg/dL   Protein, ur TRACE (A) NEGATIVE mg/dL   Nitrite NEGATIVE NEGATIVE   Leukocytes,Ua NEGATIVE NEGATIVE   RBC / HPF 0-5 0 - 5 RBC/hpf   WBC, UA 0-5 0 - 5 WBC/hpf   Bacteria, UA NONE SEEN NONE SEEN   Squamous Epithelial / HPF 0-5 0 - 5 /HPF   Hyaline Casts, UA PRESENT   Lactic acid, plasma     Status: None   Collection Time: 09/29/23  9:27 PM  Result Value Ref Range   Lactic Acid, Venous 1.5 0.5 - 1.9 mmol/L     Imaging Orders         DG Chest 2 View         CT ABDOMEN PELVIS W CONTRAST     IMPRESSION: 1. Moderate free air within the abdomen consistent with hollow viscus perforation. Small volume free fluid in the right upper quadrant. Thickened appearance of pylorus and duodenal bulb with small gas tracking to the porta hepatis, suspect that findings are secondary to perforated gastric or duodenal ulcer. Wall thickening of the second portion of duodenum is likely reactive. 2. Moderate hiatal hernia  Assessment and Plan   Samantha Ali is an 78 y.o. female with recent NSAID use and constipation who presents with severe abdominal pain, peritonitis on exam and pneumoperitoneum on imaging.  The CT suggests she has a perforated pyloric channel ulcer, but it is possible she also has colon pathology with her recent constipation issues.  I recommend emergent diagnostic laparoscopy, possible open exploratory laparotomy.  We discussed the procedure, its risks, benefits and alternatives.  I explained possible graham patch with JP drain placement.  I explained possible bowel resection with colostomy.  The risks  discussed included but were not limited to the risk of infection, bleeding, damage to nearby structures, need for prolonged intubation and need for prolonged hospital stay with additional procedures.  After a full discussion and all questions answered the patient granted consent to proceed.  We will proceed emergently to the OR.    ICD-10-CM   1. Perforated ulcer of intestine (HCC)  K63.1        Quentin Ore, MD  [facility] Use [email or web address] to contact on call provider  New Patient Billing: [id number] - High MDM

## 2023-09-29 NOTE — ED Notes (Signed)
 Report given to [facility] at [facility].

## 2023-09-29 NOTE — ED Notes (Signed)
 Patient transported to CT

## 2023-09-29 NOTE — ED Triage Notes (Addendum)
 Patient via [facility] as transfer from [facility] c/o lower abdominal pain that radiates to back x 1 day. CT showed ruptured ulcer in intestine. VSS.   GCS 14 at baseline; hx dementia.   22g - L-forearm Tx for surgical consult

## 2023-09-29 NOTE — ED Notes (Signed)
 Called [facility] to transport patient to [facility] emergency--Dr. Anitra Lauth accepting

## 2023-09-29 NOTE — ED Provider Notes (Signed)
 78 year old female transferred from med center for bowel perforation.  Spoke with general surgery, Dr. Dossie Der.  He is come down to evaluate and admit the patient.   Anders Simmonds T, DO 09/29/23 2330

## 2023-09-29 NOTE — ED Notes (Signed)
 Report given to [facility].

## 2023-09-29 NOTE — ED Triage Notes (Signed)
 Upper CP starting around 1515- now feels in lower abd as well. -N/-V/-D- last BM unknown. Denies SOB. Decreased appetite- attributed to recently starting medications for back pain- muscle relaxer, opioid, and ibuprofen. Sedentary.

## 2023-09-30 ENCOUNTER — Inpatient Hospital Stay (HOSPITAL_COMMUNITY)

## 2023-09-30 ENCOUNTER — Emergency Department (HOSPITAL_COMMUNITY): Admitting: Certified Registered Nurse Anesthetist

## 2023-09-30 ENCOUNTER — Encounter (HOSPITAL_COMMUNITY): Admission: EM | Disposition: A | Discharge status: Home / Self Care | Payer: Self-pay | Source: Home / Self Care

## 2023-09-30 ENCOUNTER — Other Ambulatory Visit: Payer: Self-pay

## 2023-09-30 ENCOUNTER — Encounter (HOSPITAL_COMMUNITY): Payer: Self-pay

## 2023-09-30 DIAGNOSIS — E876 Hypokalemia: Secondary | ICD-10-CM | POA: Diagnosis present

## 2023-09-30 DIAGNOSIS — Z7901 Long term (current) use of anticoagulants: Secondary | ICD-10-CM | POA: Diagnosis not present

## 2023-09-30 DIAGNOSIS — Z96651 Presence of right artificial knee joint: Secondary | ICD-10-CM | POA: Diagnosis present

## 2023-09-30 DIAGNOSIS — N189 Chronic kidney disease, unspecified: Secondary | ICD-10-CM

## 2023-09-30 DIAGNOSIS — I129 Hypertensive chronic kidney disease with stage 1 through stage 4 chronic kidney disease, or unspecified chronic kidney disease: Secondary | ICD-10-CM | POA: Diagnosis present

## 2023-09-30 DIAGNOSIS — Z9889 Other specified postprocedural states: Secondary | ICD-10-CM

## 2023-09-30 DIAGNOSIS — K631 Perforation of intestine (nontraumatic): Principal | ICD-10-CM | POA: Diagnosis present

## 2023-09-30 DIAGNOSIS — R6520 Severe sepsis without septic shock: Secondary | ICD-10-CM | POA: Diagnosis not present

## 2023-09-30 DIAGNOSIS — I10 Essential (primary) hypertension: Secondary | ICD-10-CM

## 2023-09-30 DIAGNOSIS — E785 Hyperlipidemia, unspecified: Secondary | ICD-10-CM | POA: Diagnosis not present

## 2023-09-30 DIAGNOSIS — Z9012 Acquired absence of left breast and nipple: Secondary | ICD-10-CM | POA: Diagnosis not present

## 2023-09-30 DIAGNOSIS — Z978 Presence of other specified devices: Secondary | ICD-10-CM

## 2023-09-30 DIAGNOSIS — A419 Sepsis, unspecified organism: Secondary | ICD-10-CM | POA: Diagnosis not present

## 2023-09-30 DIAGNOSIS — K650 Generalized (acute) peritonitis: Secondary | ICD-10-CM

## 2023-09-30 DIAGNOSIS — K265 Chronic or unspecified duodenal ulcer with perforation: Secondary | ICD-10-CM | POA: Diagnosis present

## 2023-09-30 DIAGNOSIS — K261 Acute duodenal ulcer with perforation: Secondary | ICD-10-CM | POA: Diagnosis not present

## 2023-09-30 DIAGNOSIS — Z8249 Family history of ischemic heart disease and other diseases of the circulatory system: Secondary | ICD-10-CM | POA: Diagnosis not present

## 2023-09-30 DIAGNOSIS — K449 Diaphragmatic hernia without obstruction or gangrene: Secondary | ICD-10-CM | POA: Diagnosis present

## 2023-09-30 DIAGNOSIS — D631 Anemia in chronic kidney disease: Secondary | ICD-10-CM | POA: Diagnosis present

## 2023-09-30 DIAGNOSIS — F03918 Unspecified dementia, unspecified severity, with other behavioral disturbance: Secondary | ICD-10-CM | POA: Diagnosis not present

## 2023-09-30 DIAGNOSIS — K255 Chronic or unspecified gastric ulcer with perforation: Secondary | ICD-10-CM | POA: Diagnosis present

## 2023-09-30 DIAGNOSIS — D6851 Activated protein C resistance: Secondary | ICD-10-CM | POA: Diagnosis present

## 2023-09-30 DIAGNOSIS — K659 Peritonitis, unspecified: Secondary | ICD-10-CM

## 2023-09-30 DIAGNOSIS — N179 Acute kidney failure, unspecified: Secondary | ICD-10-CM | POA: Diagnosis not present

## 2023-09-30 DIAGNOSIS — Z791 Long term (current) use of non-steroidal anti-inflammatories (NSAID): Secondary | ICD-10-CM | POA: Diagnosis not present

## 2023-09-30 DIAGNOSIS — F05 Delirium due to known physiological condition: Secondary | ICD-10-CM | POA: Diagnosis present

## 2023-09-30 DIAGNOSIS — K668 Other specified disorders of peritoneum: Secondary | ICD-10-CM | POA: Diagnosis not present

## 2023-09-30 DIAGNOSIS — Z7982 Long term (current) use of aspirin: Secondary | ICD-10-CM | POA: Diagnosis not present

## 2023-09-30 DIAGNOSIS — Z781 Physical restraint status: Secondary | ICD-10-CM | POA: Diagnosis not present

## 2023-09-30 DIAGNOSIS — F03B0 Unspecified dementia, moderate, without behavioral disturbance, psychotic disturbance, mood disturbance, and anxiety: Secondary | ICD-10-CM | POA: Diagnosis present

## 2023-09-30 DIAGNOSIS — M810 Age-related osteoporosis without current pathological fracture: Secondary | ICD-10-CM | POA: Diagnosis present

## 2023-09-30 HISTORY — PX: LAPAROSCOPY: SHX197

## 2023-09-30 LAB — GLUCOSE, CAPILLARY
Glucose-Capillary: 123 mg/dL — ABNORMAL HIGH (ref 70–99)
Glucose-Capillary: 124 mg/dL — ABNORMAL HIGH (ref 70–99)
Glucose-Capillary: 124 mg/dL — ABNORMAL HIGH (ref 70–99)
Glucose-Capillary: 143 mg/dL — ABNORMAL HIGH (ref 70–99)
Glucose-Capillary: 167 mg/dL — ABNORMAL HIGH (ref 70–99)

## 2023-09-30 LAB — POCT I-STAT 7, (LYTES, BLD GAS, ICA,H+H)
Acid-base deficit: 3 mmol/L — ABNORMAL HIGH (ref 0.0–2.0)
Bicarbonate: 23.2 mmol/L (ref 20.0–28.0)
Calcium, Ion: 1.22 mmol/L (ref 1.15–1.40)
HCT: 34 % — ABNORMAL LOW (ref 36.0–46.0)
Hemoglobin: 11.6 g/dL — ABNORMAL LOW (ref 12.0–15.0)
O2 Saturation: 100 %
Potassium: 3.8 mmol/L (ref 3.5–5.1)
Sodium: 136 mmol/L (ref 135–145)
TCO2: 25 mmol/L (ref 22–32)
pCO2 arterial: 44.6 mmHg (ref 32–48)
pH, Arterial: 7.325 — ABNORMAL LOW (ref 7.35–7.45)
pO2, Arterial: 216 mmHg — ABNORMAL HIGH (ref 83–108)

## 2023-09-30 LAB — CBC
HCT: 35.6 % — ABNORMAL LOW (ref 36.0–46.0)
HCT: 34.1 % — ABNORMAL LOW (ref 36.0–46.0)
Hemoglobin: 12 g/dL (ref 12.0–15.0)
Hemoglobin: 11.4 g/dL — ABNORMAL LOW (ref 12.0–15.0)
MCH: 30.9 pg (ref 26.0–34.0)
MCH: 30.5 pg (ref 26.0–34.0)
MCHC: 33.7 g/dL (ref 30.0–36.0)
MCHC: 33.4 g/dL (ref 30.0–36.0)
MCV: 91.8 fL (ref 80.0–100.0)
MCV: 91.2 fL (ref 80.0–100.0)
Platelets: 247 10*3/uL (ref 150–400)
Platelets: 257 10*3/uL (ref 150–400)
RBC: 3.88 MIL/uL (ref 3.87–5.11)
RBC: 3.74 MIL/uL — ABNORMAL LOW (ref 3.87–5.11)
RDW: 13.3 % (ref 11.5–15.5)
RDW: 13.3 % (ref 11.5–15.5)
WBC: 8.4 10*3/uL (ref 4.0–10.5)
WBC: 8.7 10*3/uL (ref 4.0–10.5)
nRBC: 0 % (ref 0.0–0.2)
nRBC: 0 % (ref 0.0–0.2)

## 2023-09-30 LAB — BASIC METABOLIC PANEL
Anion gap: 9 (ref 5–15)
BUN: 18 mg/dL (ref 8–23)
CO2: 22 mmol/L (ref 22–32)
Calcium: 8.7 mg/dL — ABNORMAL LOW (ref 8.9–10.3)
Chloride: 107 mmol/L (ref 98–111)
Creatinine, Ser: 0.72 mg/dL (ref 0.44–1.00)
GFR, Estimated: >60 mL/min (ref >60)
Glucose, Bld: 164 mg/dL — ABNORMAL HIGH (ref 70–99)
Potassium: 3.8 mmol/L (ref 3.5–5.1)
Sodium: 138 mmol/L (ref 135–145)

## 2023-09-30 LAB — TRIGLYCERIDES: Triglycerides: 56 mg/dL (ref <150)

## 2023-09-30 LAB — PHOSPHORUS: Phosphorus: 3.9 mg/dL (ref 2.5–4.6)

## 2023-09-30 LAB — MAGNESIUM: Magnesium: 1.7 mg/dL (ref 1.7–2.4)

## 2023-09-30 LAB — MRSA NEXT GEN BY PCR, NASAL: MRSA by PCR Next Gen: NOT DETECTED

## 2023-09-30 SURGERY — LAPAROSCOPY, DIAGNOSTIC
Anesthesia: General

## 2023-09-30 MED ORDER — CHLORHEXIDINE GLUCONATE CLOTH 2 % EX PADS
6.0000 | MEDICATED_PAD | Freq: Every day | CUTANEOUS | Status: DC
  Administered 2023-09-30 – 2023-10-01 (×2): 6 via TOPICAL

## 2023-09-30 MED ORDER — RIVASTIGMINE 4.6 MG/24HR TD PT24
4.6000 mg | MEDICATED_PATCH | Freq: Every day | TRANSDERMAL | Status: DC
  Administered 2023-09-30 – 2023-10-07 (×8): 4.6 mg via TRANSDERMAL
  Filled 2023-09-30 (×8): qty 1

## 2023-09-30 MED ORDER — ORAL CARE MOUTH RINSE: 15.0000 mL | OROMUCOSAL | Status: DC | PRN

## 2023-09-30 MED ORDER — ACETAMINOPHEN 10 MG/ML IV SOLN
1000.0000 mg | Freq: Four times a day (QID) | INTRAVENOUS | Status: AC
  Administered 2023-09-30 – 2023-10-01 (×3): 1000 mg via INTRAVENOUS
  Filled 2023-09-30 (×4): qty 100

## 2023-09-30 MED ORDER — TRAVASOL 10 % IV SOLN
INTRAVENOUS | Status: AC
Start: 2023-09-30 — End: 2023-10-01
  Filled 2023-09-30: qty 350.1

## 2023-09-30 MED ORDER — LABETALOL HCL 5 MG/ML IV SOLN: 5.0000 mg | Freq: Once | INTRAVENOUS | Status: DC

## 2023-09-30 MED ORDER — PROPOFOL 500 MG/50ML IV EMUL
INTRAVENOUS | Status: DC | PRN
  Administered 2023-09-30: 50 ug/kg/min via INTRAVENOUS

## 2023-09-30 MED ORDER — 0.9 % SODIUM CHLORIDE (POUR BTL) OPTIME
TOPICAL | Status: DC | PRN
Start: 2023-09-30 — End: 2023-09-30
  Administered 2023-09-30: 1000 mL

## 2023-09-30 MED ORDER — HYDRALAZINE HCL 20 MG/ML IJ SOLN
10.0000 mg | Freq: Four times a day (QID) | INTRAMUSCULAR | Status: DC | PRN
  Administered 2023-09-30 – 2023-10-01 (×3): 10 mg via INTRAVENOUS
  Filled 2023-09-30 (×3): qty 1

## 2023-09-30 MED ORDER — FENTANYL CITRATE (PF) 250 MCG/5ML IJ SOLN
INTRAMUSCULAR | Status: AC
  Filled 2023-09-30: qty 5

## 2023-09-30 MED ORDER — LABETALOL HCL 5 MG/ML IV SOLN
5.0000 mg | INTRAVENOUS | Status: AC | PRN
  Administered 2023-09-30 (×2): 5 mg via INTRAVENOUS

## 2023-09-30 MED ORDER — PROPOFOL 1000 MG/100ML IV EMUL: 0.0000 ug/kg/min | INTRAVENOUS | Status: DC

## 2023-09-30 MED ORDER — FENTANYL BOLUS VIA INFUSION
25.0000 ug | INTRAVENOUS | Status: DC | PRN
  Administered 2023-09-30 (×4): 25 ug via INTRAVENOUS

## 2023-09-30 MED ORDER — ENOXAPARIN SODIUM 40 MG/0.4ML IJ SOSY: 40.0000 mg | PREFILLED_SYRINGE | INTRAMUSCULAR | Status: DC

## 2023-09-30 MED ORDER — FENTANYL CITRATE PF 50 MCG/ML IJ SOSY: 25.0000 ug | PREFILLED_SYRINGE | INTRAMUSCULAR | Status: DC | PRN

## 2023-09-30 MED ORDER — LACTATED RINGERS IV SOLN: INTRAVENOUS | Status: DC | PRN

## 2023-09-30 MED ORDER — PANTOPRAZOLE SODIUM 40 MG IV SOLR
40.0000 mg | INTRAVENOUS | Status: DC
  Administered 2023-09-30: 40 mg via INTRAVENOUS
  Filled 2023-09-30: qty 10

## 2023-09-30 MED ORDER — HYDRALAZINE HCL 20 MG/ML IJ SOLN
INTRAMUSCULAR | Status: AC
  Filled 2023-09-30: qty 1

## 2023-09-30 MED ORDER — LACTATED RINGERS IV SOLN: INTRAVENOUS | Status: AC

## 2023-09-30 MED ORDER — PHENYLEPHRINE HCL-NACL 20-0.9 MG/250ML-% IV SOLN
INTRAVENOUS | Status: DC | PRN
  Administered 2023-09-30: 40 ug/min via INTRAVENOUS

## 2023-09-30 MED ORDER — SUCCINYLCHOLINE CHLORIDE 200 MG/10ML IV SOSY
PREFILLED_SYRINGE | INTRAVENOUS | Status: DC | PRN
  Administered 2023-09-30: 80 mg via INTRAVENOUS

## 2023-09-30 MED ORDER — FENTANYL CITRATE PF 50 MCG/ML IJ SOSY
25.0000 ug | PREFILLED_SYRINGE | Freq: Once | INTRAMUSCULAR | Status: AC
  Administered 2023-09-30: 25 ug via INTRAVENOUS

## 2023-09-30 MED ORDER — PIPERACILLIN-TAZOBACTAM 3.375 G IVPB
3.3750 g | Freq: Three times a day (TID) | INTRAVENOUS | Status: AC
  Administered 2023-09-30 – 2023-10-05 (×16): 3.375 g via INTRAVENOUS
  Filled 2023-09-30 (×16): qty 50

## 2023-09-30 MED ORDER — FENTANYL CITRATE (PF) 250 MCG/5ML IJ SOLN
INTRAMUSCULAR | Status: DC | PRN
  Administered 2023-09-30 (×5): 50 ug via INTRAVENOUS

## 2023-09-30 MED ORDER — LIDOCAINE 2% (20 MG/ML) 5 ML SYRINGE
INTRAMUSCULAR | Status: DC | PRN
  Administered 2023-09-30: 60 mg via INTRAVENOUS

## 2023-09-30 MED ORDER — MAGNESIUM SULFATE 2 GM/50ML IV SOLN
2.0000 g | Freq: Once | INTRAVENOUS | Status: AC
  Administered 2023-09-30: 2 g via INTRAVENOUS
  Filled 2023-09-30: qty 50

## 2023-09-30 MED ORDER — ALBUMIN HUMAN 5 % IV SOLN
INTRAVENOUS | Status: DC | PRN
Start: 2023-09-30 — End: 2023-09-30

## 2023-09-30 MED ORDER — ORAL CARE MOUTH RINSE
15.0000 mL | OROMUCOSAL | Status: DC
  Administered 2023-09-30 (×9): 15 mL via OROMUCOSAL

## 2023-09-30 MED ORDER — FLUCONAZOLE IN SODIUM CHLORIDE 400-0.9 MG/200ML-% IV SOLN
400.0000 mg | INTRAVENOUS | Status: AC
  Administered 2023-10-01 – 2023-10-05 (×5): 400 mg via INTRAVENOUS
  Filled 2023-09-30 (×5): qty 200

## 2023-09-30 MED ORDER — ROCURONIUM BROMIDE 10 MG/ML (PF) SYRINGE
PREFILLED_SYRINGE | INTRAVENOUS | Status: DC | PRN
Start: 2023-09-30 — End: 2023-09-30
  Administered 2023-09-30: 80 mg via INTRAVENOUS

## 2023-09-30 MED ORDER — ENOXAPARIN SODIUM 40 MG/0.4ML IJ SOSY
40.0000 mg | PREFILLED_SYRINGE | INTRAMUSCULAR | Status: DC
  Administered 2023-09-30 – 2023-10-07 (×8): 40 mg via SUBCUTANEOUS
  Filled 2023-09-30 (×8): qty 0.4

## 2023-09-30 MED ORDER — SODIUM CHLORIDE 0.9% FLUSH: 10.0000 mL | INTRAVENOUS | Status: DC | PRN

## 2023-09-30 MED ORDER — METHOCARBAMOL 1000 MG/10ML IJ SOLN
500.0000 mg | Freq: Four times a day (QID) | INTRAMUSCULAR | Status: DC | PRN
  Administered 2023-09-30: 500 mg via INTRAVENOUS
  Filled 2023-09-30: qty 10

## 2023-09-30 MED ORDER — SODIUM CHLORIDE 0.9% FLUSH
10.0000 mL | Freq: Two times a day (BID) | INTRAVENOUS | Status: DC
  Administered 2023-09-30: 10 mL
  Administered 2023-09-30: 20 mL
  Administered 2023-10-01 – 2023-10-02 (×4): 10 mL
  Administered 2023-10-03: 30 mL
  Administered 2023-10-03: 10 mL
  Administered 2023-10-04: 30 mL
  Administered 2023-10-04 – 2023-10-06 (×4): 10 mL
  Administered 2023-10-07: 20 mL

## 2023-09-30 MED ORDER — BUPIVACAINE-EPINEPHRINE (PF) 0.25% -1:200000 IJ SOLN
INTRAMUSCULAR | Status: AC
  Filled 2023-09-30: qty 30

## 2023-09-30 MED ORDER — HYDROMORPHONE HCL 1 MG/ML IJ SOLN
0.5000 mg | INTRAMUSCULAR | Status: DC | PRN
  Administered 2023-09-30 (×2): 0.5 mg via INTRAVENOUS
  Filled 2023-09-30: qty 0.5
  Filled 2023-09-30: qty 1

## 2023-09-30 MED ORDER — BUPIVACAINE HCL 0.25 % IJ SOLN
INTRAMUSCULAR | Status: DC | PRN
  Administered 2023-09-30: 7 mL

## 2023-09-30 MED ORDER — PROPOFOL 10 MG/ML IV BOLUS
INTRAVENOUS | Status: DC | PRN
  Administered 2023-09-30: 50 mg via INTRAVENOUS

## 2023-09-30 MED ORDER — LABETALOL HCL 5 MG/ML IV SOLN
INTRAVENOUS | Status: AC
  Filled 2023-09-30: qty 4

## 2023-09-30 MED ORDER — FLUCONAZOLE IN SODIUM CHLORIDE 400-0.9 MG/200ML-% IV SOLN
400.0000 mg | INTRAVENOUS | Status: AC
  Administered 2023-09-30 (×2): 400 mg via INTRAVENOUS
  Filled 2023-09-30 (×2): qty 200

## 2023-09-30 MED ORDER — FENTANYL 2500MCG IN NS 250ML (10MCG/ML) PREMIX INFUSION
25.0000 ug/h | INTRAVENOUS | Status: DC
  Administered 2023-09-30: 25 ug/h via INTRAVENOUS
  Filled 2023-09-30: qty 250

## 2023-09-30 SURGICAL SUPPLY — 66 items
BAG COUNTER SPONGE SURGICOUNT (BAG) ×1 IMPLANT
BLADE CLIPPER SURG (BLADE) IMPLANT
CANISTER SUCT 3000ML PPV (MISCELLANEOUS) ×1 IMPLANT
CHLORAPREP W/TINT 26 (MISCELLANEOUS) ×1 IMPLANT
COVER SURGICAL LIGHT HANDLE (MISCELLANEOUS) ×1 IMPLANT
DERMABOND ADVANCED .7 DNX12 (GAUZE/BANDAGES/DRESSINGS) ×1 IMPLANT
DRAIN CHANNEL 19F RND (DRAIN) IMPLANT
DRAPE LAPAROSCOPIC ABDOMINAL (DRAPES) ×1 IMPLANT
DRAPE WARM FLUID 44X44 (DRAPES) ×1 IMPLANT
DRSG OPSITE POSTOP 4X10 (GAUZE/BANDAGES/DRESSINGS) IMPLANT
DRSG OPSITE POSTOP 4X8 (GAUZE/BANDAGES/DRESSINGS) IMPLANT
ELECT BLADE 6.5 EXT (BLADE) IMPLANT
ELECT CAUTERY BLADE 6.4 (BLADE) ×1 IMPLANT
ELECT REM PT RETURN 9FT ADLT (ELECTROSURGICAL) ×1 IMPLANT
ELECTRODE REM PT RTRN 9FT ADLT (ELECTROSURGICAL) ×1 IMPLANT
GAUZE SPONGE 4X4 12PLY STRL (GAUZE/BANDAGES/DRESSINGS) IMPLANT
GLOVE BIO SURGEON STRL SZ7.5 (GLOVE) ×1 IMPLANT
GLOVE BIOGEL PI IND STRL 8 (GLOVE) ×1 IMPLANT
GOWN STRL REUS W/ TWL LRG LVL3 (GOWN DISPOSABLE) ×2 IMPLANT
GOWN STRL REUS W/ TWL XL LVL3 (GOWN DISPOSABLE) ×1 IMPLANT
HANDLE SUCTION POOLE (INSTRUMENTS) ×1 IMPLANT
IRRIG SUCT STRYKERFLOW 2 WTIP (MISCELLANEOUS) ×1 IMPLANT
IRRIGATION SUCT STRKRFLW 2 WTP (MISCELLANEOUS) IMPLANT
KIT BASIN OR (CUSTOM PROCEDURE TRAY) ×1 IMPLANT
KIT TURNOVER KIT B (KITS) ×1 IMPLANT
LIGASURE IMPACT 36 18CM CVD LR (INSTRUMENTS) IMPLANT
LIGASURE VESSEL 5MM BLUNT TIP (ELECTROSURGICAL) IMPLANT
NDL 22X1.5 STRL (OR ONLY) (MISCELLANEOUS) ×1 IMPLANT
NDL INSUFFLATION 14GA 120MM (NEEDLE) ×1 IMPLANT
NEEDLE 22X1.5 STRL (OR ONLY) (MISCELLANEOUS) ×1 IMPLANT
NEEDLE INSUFFLATION 14GA 120MM (NEEDLE) ×1 IMPLANT
NS IRRIG 1000ML POUR BTL (IV SOLUTION) ×2 IMPLANT
PACK GENERAL/GYN (CUSTOM PROCEDURE TRAY) ×1 IMPLANT
PAD ARMBOARD 7.5X6 YLW CONV (MISCELLANEOUS) ×2 IMPLANT
PENCIL BUTTON HOLSTER BLD 10FT (ELECTRODE) IMPLANT
PENCIL SMOKE EVACUATOR (MISCELLANEOUS) ×1 IMPLANT
SCISSORS LAP 5X35 DISP (ENDOMECHANICALS) IMPLANT
SET TUBE SMOKE EVAC HIGH FLOW (TUBING) ×1 IMPLANT
SLEEVE Z-THREAD 5X100MM (TROCAR) ×1 IMPLANT
SPECIMEN JAR LARGE (MISCELLANEOUS) IMPLANT
SPONGE T-LAP 18X18 ~~LOC~~+RFID (SPONGE) IMPLANT
STAPLER VISISTAT 35W (STAPLE) ×1 IMPLANT
SUCTION POOLE HANDLE (INSTRUMENTS) IMPLANT
SUT ETHILON 2 0 FS 18 (SUTURE) IMPLANT
SUT MNCRL AB 3-0 PS2 18 (SUTURE) IMPLANT
SUT MNCRL AB 4-0 PS2 18 (SUTURE) ×1 IMPLANT
SUT PDS AB 1 TP1 54 (SUTURE) ×2 IMPLANT
SUT SILK 2 0 SH CR/8 (SUTURE) ×1 IMPLANT
SUT SILK 2 0 TIES 10X30 (SUTURE) ×1 IMPLANT
SUT SILK 3 0 SH CR/8 (SUTURE) ×1 IMPLANT
SUT SILK 3 0 TIES 10X30 (SUTURE) ×1 IMPLANT
SUT VIC AB 2-0 SH 27XBRD (SUTURE) IMPLANT
SUT VICRYL 0 UR6 27IN ABS (SUTURE) IMPLANT
TAPE CLOTH SURG 4X10 WHT LF (GAUZE/BANDAGES/DRESSINGS) IMPLANT
TOWEL GREEN STERILE (TOWEL DISPOSABLE) ×1 IMPLANT
TOWEL GREEN STERILE FF (TOWEL DISPOSABLE) ×1 IMPLANT
TRAY FOLEY MTR SLVR 16FR STAT (SET/KITS/TRAYS/PACK) ×1 IMPLANT
TRAY LAPAROSCOPIC MC (CUSTOM PROCEDURE TRAY) ×1 IMPLANT
TROCAR 11X100 Z THREAD (TROCAR) IMPLANT
TROCAR BALLN 12MMX100 BLUNT (TROCAR) IMPLANT
TROCAR OPTICAL SHORT 5MM (TROCAR) IMPLANT
TROCAR XCEL NON-BLD 5MMX100MML (ENDOMECHANICALS) ×1 IMPLANT
TROCAR Z THREAD OPTICAL 12X100 (TROCAR) IMPLANT
TROCAR Z-THREAD OPTICAL 5X100M (TROCAR) IMPLANT
WARMER LAPAROSCOPE (MISCELLANEOUS) ×1 IMPLANT
YANKAUER SUCT BULB TIP NO VENT (SUCTIONS) IMPLANT

## 2023-09-30 NOTE — Progress Notes (Signed)
 Pharmacy Antimicrobial Note  Samantha Ali is a 78 y.o. female admitted on 09/29/2023 with perforated duodenal bulb ulcer with pneumoperitoneum and purulent peritonitis.  Pharmacy has been consulted for fluconazole dosing.  Pt is currently on appropriate Zosyn regimen.  Plan: Fluconazole 800mg  IV x1 followed by 400mg  IV daily.  Height: 5\' 3"  (160 cm) Weight: 59 kg (130 lb 1.1 oz) IBW/kg (Calculated) : 52.4  Temp (24hrs), Avg:99.5 F (37.5 C), Min:99.3 F (37.4 C), Max:99.6 F (37.6 C)  Recent Labs  Lab 09/29/23 1751 09/29/23 2127  WBC 9.1  --   CREATININE 1.20*  --   LATICACIDVEN  --  1.5    Estimated Creatinine Clearance: 32 mL/min (A) (by C-G formula based on SCr of 1.2 mg/dL (H)).    Allergies  Allergen Reactions   Aricept [Donepezil]     Bad dreams    Thank you for allowing pharmacy to be a part of this patient's care.  Vernard Gambles, PharmD, BCPS  09/30/2023 3:00 AM

## 2023-09-30 NOTE — Progress Notes (Signed)
 Patient became very agitated during PICC line placement.  HR was in the 160s and systolic BP was 190s per Aline.  Dr. Everardo All was notified and order were given for Hydralazine.  Hydralazine was given.  Patient continued to remain tachycardic at which point Dr. Everardo All assessed the patient at the bedside.  Orders were given for labetalol.

## 2023-09-30 NOTE — Progress Notes (Signed)
 eLink Physician-Brief Progress Note Patient Name: Samantha Ali DOB: [birth year] MRN: [id number]   Date of Service  09/30/2023  HPI/Events of Note  45 F CKD, Factor V Leiden, DVT, presented with acute abdominal pain CT with perforated bowel, underwent exlap with note of perforation on the anterior portion of the duodenal bulb. Graham patch repair done.  Seen intubated in the ICU Mg 1.7, creatinine 0.72  eICU Interventions  Initiate electrolyte replacement protocol Ventilatory support until fully awake     Intervention Category Evaluation Type: New Patient Evaluation  Darl Pikes 09/30/2023, 5:52 AM

## 2023-09-30 NOTE — Anesthesia Preprocedure Evaluation (Addendum)
 Anesthesia Evaluation  Patient identified by MRN, date of birth, ID band Patient awake    Reviewed: Allergy & Precautions, H&P , NPO status , Patient's Chart, lab work & pertinent test results  Airway Mallampati: III  TM Distance: >3 FB Neck ROM: Full    Dental no notable dental hx. (+) Teeth Intact, Dental Advisory Given   Pulmonary neg pulmonary ROS   Pulmonary exam normal breath sounds clear to auscultation       Cardiovascular + DVT   Rhythm:Regular Rate:Normal     Neuro/Psych  Headaches      Dementia    GI/Hepatic Neg liver ROS,GERD  Medicated,,  Endo/Other  negative endocrine ROS    Renal/GU Renal InsufficiencyRenal disease  negative genitourinary   Musculoskeletal  (+) Arthritis , Osteoarthritis,    Abdominal   Peds  Hematology negative hematology ROS (+)   Anesthesia Other Findings   Reproductive/Obstetrics negative OB ROS                             Anesthesia Physical Anesthesia Plan  ASA: 3 and emergent  Anesthesia Plan: General   Post-op Pain Management: Ofirmev IV (intra-op)*   Induction: Intravenous, Rapid sequence and Cricoid pressure planned  PONV Risk Score and Plan: 3 and Ondansetron, Dexamethasone and Treatment may vary due to age or medical condition  Airway Management Planned: Oral ETT  Additional Equipment:   Intra-op Plan:   Post-operative Plan: Extubation in OR and Possible Post-op intubation/ventilation  Informed Consent: I have reviewed the patients History and Physical, chart, labs and discussed the procedure including the risks, benefits and alternatives for the proposed anesthesia with the patient or authorized representative who has indicated his/her understanding and acceptance.     Dental advisory given  Plan Discussed with: CRNA  Anesthesia Plan Comments:        Anesthesia Quick Evaluation

## 2023-09-30 NOTE — Anesthesia Postprocedure Evaluation (Signed)
 Anesthesia Post Note  Patient: Samantha Ali  Procedure(s) Performed: LAPAROSCOPIC GRAHAM PATCH OF ULCER     Patient location during evaluation: SICU Anesthesia Type: General Level of consciousness: sedated Pain management: pain level controlled Vital Signs Assessment: post-procedure vital signs reviewed and stable Respiratory status: patient remains intubated per anesthesia plan Cardiovascular status: stable Postop Assessment: no apparent nausea or vomiting Anesthetic complications: no  No notable events documented.  Last Vitals:  Vitals:   09/29/23 2300 09/29/23 2345  BP: (!) 139/93 136/62  Pulse: 83 81  Resp: (!) 26 (!) 23  Temp:    SpO2: 98% 99%    Last Pain:  Vitals:   09/29/23 2210  TempSrc: Tympanic  PainSc:                  Samantha Ali,W. EDMOND

## 2023-09-30 NOTE — Progress Notes (Signed)
 * Day of Surgery *   Subjective/Chief Complaint: Alert on vent, follows commands   Objective: Vital signs in last 24 hours: Temp:  [98.2 F (36.8 C)-99.6 F (37.6 C)] 98.2 F (36.8 C) (03/05 0800) Pulse Rate:  [68-90] 68 (03/05 0800) Resp:  [14-26] 16 (03/05 0800) BP: (90-176)/(51-93) 100/59 (03/05 0800) SpO2:  [97 %-100 %] 99 % (03/05 0800) Arterial Line BP: (93-153)/(46-71) 110/52 (03/05 0800) FiO2 (%):  [40 %-60 %] 40 % (03/05 0728) Weight:  [59 kg] 59 kg (03/04 2255) Last BM Date :  (PTA)  Intake/Output from previous day: 03/04 0701 - 03/05 0700 In: 3768.1 [I.V.:1447; IV Piggyback:2321.1] Out: 895 [Urine:830; Drains:40; Blood:25] Intake/Output this shift: No intake/output data recorded.  Ab soft approp tender, incisions clean, drain serosang  Lab Results:  Recent Labs    09/30/23 0330 09/30/23 0345 09/30/23 0541  WBC 8.4  --  8.7  HGB 12.0 11.6* 11.4*  HCT 35.6* 34.0* 34.1*  PLT 247  --  257   BMET Recent Labs    09/29/23 1751 09/30/23 0330 09/30/23 0345  NA 135 138 136  K 3.7 3.8 3.8  CL 98 107  --   CO2 25 22  --   GLUCOSE 126* 164*  --   BUN 33* 18  --   CREATININE 1.20* 0.72  --   CALCIUM 10.1 8.7*  --    PT/INR No results for input(s): "LABPROT", "INR" in the last 72 hours. ABG Recent Labs    09/30/23 0345  PHART 7.325*  HCO3 23.2    Studies/Results: DG Abd Portable 1V Result Date: 09/30/2023 CLINICAL DATA:  Check gastric catheter placement EXAM: PORTABLE ABDOMEN - 1 VIEW COMPARISON:  None Available. FINDINGS: Gastric catheter is noted extending into the distal stomach. Surgical drain is noted. Scattered bowel gas is seen. Degenerative changes of the thoracolumbar spine are noted. IMPRESSION: Gastric catheter within the distal stomach. Electronically Signed   By: Alcide Clever M.D.   On: 09/30/2023 03:32   DG CHEST PORT 1 VIEW Result Date: 09/30/2023 CLINICAL DATA:  [id number].  NGT positioning film. [id number].  Encounter for intubation. EXAM:  PORTABLE CHEST 1 VIEW COMPARISON:  AP Lat chest yesterday at 6:12 p.m. FINDINGS: 3:11 a.m. ETT interval insertion with tip 3.7 cm from the carina. NGT tip is just inside the stomach and needs to be advanced further in 12-15 cm. There is a low inspiration on exam. This could obscure airspace disease in the bases. There is linear atelectasis in the hypoinflated lung bases. Above the diaphragm the visualized lungs are clear. Heart size and vasculature are normal. Stable mediastinum with aortic tortuosity and atherosclerosis. Small hiatal hernia. No new osseous findings. Mild S shaped thoracic scoliosis again noted. Left mastectomy and axillary surgical clips are also redemonstrated. This also was seen on 08/05/2012 PA and lateral study. IMPRESSION: 1. ETT tip 3.7 cm from the carina. 2. NGT tip is just inside the stomach and needs to be advanced further in 12-15 cm. 3. Low inspiration with linear atelectasis in the hypoinflated lung bases. 4. Aortic atherosclerosis. 5. Small hiatal hernia. Electronically Signed   By: Almira Bar M.D.   On: 09/30/2023 03:30   [place] EKG SITE RITE Result Date: 09/30/2023 If Site Rite image not attached, placement could not be confirmed due to current cardiac rhythm.  CT ABDOMEN PELVIS W CONTRAST Result Date: 09/29/2023 CLINICAL DATA:  Abdomen pain chest pain nausea vomiting EXAM: CT ABDOMEN AND PELVIS WITH CONTRAST TECHNIQUE: Multidetector CT imaging of the  abdomen and pelvis was performed using the standard protocol following bolus administration of intravenous contrast. RADIATION DOSE REDUCTION: This exam was performed according to the departmental dose-optimization program which includes automated exposure control, adjustment of the mA and/or kV according to patient size and/or use of iterative reconstruction technique. CONTRAST:  80mL OMNIPAQUE IOHEXOL 300 MG/ML  SOLN COMPARISON:  CT 03/07/2022 FINDINGS: Lower chest: Lung bases demonstrate no acute airspace disease. Limited by  patient habitus an patient positioning, patient was imaged in the right lateral decubitus position. Moderate hiatal hernia Hepatobiliary: No calcified gallstone or biliary dilatation. Subcentimeter hypodensity in the right hepatic lobe too small to further characterize Pancreas: Unremarkable. No pancreatic ductal dilatation or surrounding inflammatory changes. Spleen: Normal in size without focal abnormality. Adrenals/Urinary Tract: Adrenal glands are normal. Kidneys show no hydronephrosis. The bladder is slightly thick walled Stomach/Bowel: Stomach nondistended. Wall thickening of the pylorus and duodenal bulb. Small gas collection tracking to the porta hepatis from the duodenal bulb region, coronal series 6, image 66. Wall thickening of the second portion of duodenum, coronal series 6, image 56. Remainder of the small bowel is unremarkable. Large stool burden in the cecum. Nonvisualized appendix Vascular/Lymphatic: No significant vascular findings are present. No enlarged abdominal or pelvic lymph nodes. Reproductive: Uterus and bilateral adnexa are unremarkable. Other: Moderate free air mostly in the upper abdomen with some free air superficial to the left colon. Small volume free fluid in the right upper quadrant. Musculoskeletal: Scoliosis and degenerative changes. No acute osseous abnormality IMPRESSION: 1. Moderate free air within the abdomen consistent with hollow viscus perforation. Small volume free fluid in the right upper quadrant. Thickened appearance of pylorus and duodenal bulb with small gas tracking to the porta hepatis, suspect that findings are secondary to perforated gastric or duodenal ulcer. Wall thickening of the second portion of duodenum is likely reactive. 2. Moderate hiatal hernia Critical Value/emergent results were called by telephone at the time of interpretation on 09/29/2023 at 9:14 pm to provider [facility] , who verbally acknowledged these results. Electronically Signed   By:  Jasmine Pang M.D.   On: 09/29/2023 21:14   DG Chest 2 View Result Date: 09/29/2023 CLINICAL DATA:  Upper chest pain EXAM: CHEST - 2 VIEW COMPARISON:  Chest radiograph dated 08/05/2012 FINDINGS: Low lung volumes with bronchovascular crowding. No focal consolidations. No pleural effusion or pneumothorax. The heart size and mediastinal contours are within normal limits. No acute osseous abnormality. Left axillary surgical clips. IMPRESSION: Low lung volumes with bronchovascular crowding. No focal consolidations. Electronically Signed   By: Agustin Cree M.D.   On: 09/29/2023 20:17    Anti-infectives: Anti-infectives (From admission, onward)    Start     Dose/Rate Route Frequency Ordered Stop   10/01/23 0400  fluconazole (DIFLUCAN) IVPB 400 mg        400 mg 100 mL/hr over 120 Minutes Intravenous Every 24 hours 09/30/23 0303     09/30/23 0600  piperacillin-tazobactam (ZOSYN) IVPB 3.375 g        3.375 g 12.5 mL/hr over 240 Minutes Intravenous Every 8 hours 09/30/23 0246 10/07/23 0559   09/30/23 0400  fluconazole (DIFLUCAN) IVPB 400 mg        400 mg 100 mL/hr over 120 Minutes Intravenous Every 2 hours 09/30/23 0303 09/30/23 0754   09/29/23 2134  ceFEPIme (MAXIPIME) 1 g injection       Note to Pharmacy: Schuyler Amor: cabinet override      09/29/23 2134 09/30/23 0944   09/29/23 2130  ceFEPIme (MAXIPIME) 1 g in sodium chloride 0.9 % 100 mL IVPB        1 g 200 mL/hr over 30 Minutes Intravenous  Once 09/29/23 2119 09/29/23 2218   09/29/23 2130  metroNIDAZOLE (FLAGYL) IVPB 500 mg        500 mg 100 mL/hr over 60 Minutes Intravenous  Once 09/29/23 2119 09/29/23 2315       Assessment/Plan: DOS lap Cheree Ditto patch of perforated DU-PS -extubate today -per primary surgeon needs to be NPO with NGT for 5 days postop then plan for UGI -zosyn/diflucan for five days postop -start lovenox today- need to figure out if she was on more before today -picc/TPN to start today    Emelia Loron 09/30/2023

## 2023-09-30 NOTE — Progress Notes (Signed)
 Peripherally Inserted Central Catheter Placement  The IV Nurse has discussed with the patient and/or persons authorized to consent for the patient, the purpose of this procedure and the potential benefits and risks involved with this procedure.  The benefits include less needle sticks, lab draws from the catheter, and the patient may be discharged home with the catheter. Risks include, but not limited to, infection, bleeding, blood clot (thrombus formation), and puncture of an artery; nerve damage and irregular heartbeat and possibility to perform a PICC exchange if needed/ordered by physician.  Alternatives to this procedure were also discussed.  Bard Power PICC patient education guide, fact sheet on infection prevention and patient information card has been provided to patient /or left at bedside.  Consent obtained by Primary RN with husband at bedside     PICC Placement Documentation  PICC Double Lumen 09/30/23 Right Basilic 36 cm 1 cm (Active)  Indication for Insertion or Continuance of Line Administration of hyperosmolar/irritating solutions (i.e. TPN, Vancomycin, etc.) 09/30/23 1400  Exposed Catheter (cm) 1 cm 09/30/23 1400  Site Assessment Clean, Dry, Intact 09/30/23 1400  Lumen #1 Status Flushed;Saline locked;Blood return noted 09/30/23 1400  Lumen #2 Status Flushed;Saline locked;Blood return noted 09/30/23 1400  Dressing Type Transparent;Securing device 09/30/23 1400  Dressing Status Antimicrobial disc/dressing in place;Clean, Dry, Intact 09/30/23 1400  Line Care Connections checked and tightened 09/30/23 1400  Line Adjustment (NICU/IV Team Only) No 09/30/23 1400  Dressing Intervention New dressing 09/30/23 1400  Dressing Change Due 10/07/23 09/30/23 1400       Franne Grip Renee 09/30/2023, 2:40 PM

## 2023-09-30 NOTE — Progress Notes (Signed)
 PHARMACY - TOTAL PARENTERAL NUTRITION CONSULT NOTE   Indication:  anticipated prolonged NPO following duodenal ulcer repair   Patient Measurements: Height: 5\' 3"  (160 cm) Weight: 59 kg (130 lb 1.1 oz) IBW/kg (Calculated) : 52.4 TPN AdjBW (KG): 59 Body mass index is 23.04 kg/m. Usual Weight: 59kg on admission   Assessment:  Patient admitted with chief compliant of abdominal pain. CT abdomen suggests perforated pyloric channel ulcer but also could be a colon pathology due to recent constipation. Patient underwent ex-lap on 3/5 which should perforated duodenal ulcer with pneumoperitoneum and purulent peritonitis. Ulcer was then patched and patient was closed. Anticipate patient to be NPO for at least 5 days until able to undergo an UGI to ensure duodenal repair was successful. Pharmacy consulted to start TPN.   Patient currently intubated and unable to be interviewed for prior to admission nutritional information   Glucose / Insulin: BG 123-164, no hx of DM, no recent A1C  Electrolytes: K: 3.8, mag: 1.7, HCO3: 22, chloride: 107, all others WNL  Renal: Scr/BUN WNL  Hepatic: T-bili 0.6, AST/ALT WNL, albumin: 4.6, alk phos WNL, TG: 56  Intake / Output; MIVF: UOP - , -40mL drain, net +2.8L, LR 17mL/hr  GI Imaging: 3/4 CT abdomen: moderate free fluid in abdomen consistent with perforation, potential duodenal ulcer  GI Surgeries / Procedures:  3/5: ex-lap and duodenal ulcer repair   Central access: PICC to be placed 3/5  TPN start date: 3/5   Nutritional Goals: Goal TPN rate is 60 mL/hr (provides 70 g of protein and 1500 kcals per day, meeting 100% of nutritional needs)  RD Assessment: 1500-1700 kcal  70-90 grams protein     Current Nutrition:  NPO and TPN  Plan:  Start TPN at 40mL/hr at 1800, will meet approximately 50% of nutritional needs.  Electrolytes in TPN: Na 62mEq/L, K 23mEq/L, Ca 91mEq/L, Mg 57mEq/L, and Phos 78mmol/L. Cl:Ac 1:2  2g IV mag sulfate given by primary.   Add standard MVI and trace elements to TPN Initiate Sensitive q4h SSI and adjust as needed  Reduce MIVF to 45 mL/hr at 1800 Monitor TPN labs on Mon/Thurs, daily until stabilization.   Estill Batten, PharmD, BCCCP  09/30/2023,7:24 AM

## 2023-09-30 NOTE — Consult Note (Signed)
 NAME:  Samantha Ali, MRN:  [id number], DOB:  [birth year], LOS: 0 ADMISSION DATE:  09/29/2023, CONSULTATION DATE:  3/5 REFERRING MD:  Dr. Dossie Der , CHIEF COMPLAINT:  bowel perf.    History of Present Illness:  78 year old female with past medical history as below, which is significant for CKD, factor V Leiden, DVT, arthritis, osteoporosis, and hyperlipidemia.  [facility] with complaints of acute onset abdominal pain while sitting on the toilet.  Abdominal pain was diffuse and severe.  Upon arrival to the emergency department she was noted to be extremely tender throughout her abdomen.  CT of the abdomen showed perforated duodenal versus gastric ulcer.  Surgery was consulted and the patient was transferred to [facility] for formal evaluation.  She was taken emergently for diagnostic laparotomy.  Perforation was identified on the anterior portion of the duodenal bulb.  There was frank purulent peritonitis noted as well.  JP drain was placed and the patient was closed.  She was transferred to the neurotrauma ICU for ongoing management and PCCM was consulted.   Pertinent  Medical History   has a past medical history of Arthritis, Breast cancer (HCC) (2004), Cancer (HCC), CKD (chronic kidney disease), Clot (1996), DVT (deep venous thrombosis) (HCC) (1990's), Factor 5 Leiden mutation, heterozygous (HCC) (11/21/2013), GERD (gastroesophageal reflux disease), Headache(784.0), Heterozygous factor V Leiden mutation (HCC) (01/16/2014), Hyperlipidemia, Insomnia, Migraine, Osteoporosis, and Photophobia of both eyes.   Significant Hospital Events: Including procedures, antibiotic start and stop dates in addition to other pertinent events   3/4 admit for perf, OR > duodenal ulcer perf. To ICU post op on vent.   Interim History / Subjective:    Objective   Blood pressure 136/62, pulse 81, temperature 99.3 F (37.4 C), temperature source Tympanic, resp. rate (!) 23, height 5'  3" (1.6 m), weight 59 kg, SpO2 99%.        Intake/Output Summary (Last 24 hours) at 09/30/2023 0303 Last data filed at 09/30/2023 0865 Gross per 24 hour  Intake 3294.85 ml  Output --  Net 3294.85 ml   Filed Weights   09/29/23 2255  Weight: 59 kg    Examination: General: Elderly appearing female in no acute distress HENT: Normocephalic, atraumatic, PERRL Lungs: Clear bilateral breath sounds Cardiovascular: Regular rate and rhythm Abdomen: Soft, nontender, nondistended.  JP bulb drain on the right draining purulent/serosanguineous fluid.  Laparotomy incisions without bleeding or leak. Extremities: No acute deformity Neuro: Sedate RASS -4  Resolved Hospital Problem list     Assessment & Plan:   Duodenal bulb perforation: Status post laparoscopic repair -Management per general surgery -N.p.o. times at least 5 days -Will likely need TPN -Monitor drain output  Peritonitis secondary to perforation -Continue Zosyn, fluconazole -Follow cultures  Endotracheal tube present following surgery -Full vent support for tonight -Plan for SBT/WUA in the morning -VAP bundle -CXR and ABG pending -Propofol and fentanyl infusions for RASS goal -1 to -2 and analgesia.  Hypertension -No home medications -Treat pain -PRNs if necessary  AKI -Hydrate -Trend chemistry   Best Practice (right click and "Reselect all SmartList Selections" daily)   Diet/type: NPO DVT prophylaxis LMWH Pressure ulcer(s): N/A GI prophylaxis: PPI Lines: N/A Foley:  Yes, and it is still needed Code Status:  full code Last date of multidisciplinary goals of care discussion [ ]   Labs   CBC: Recent Labs  Lab 09/29/23 1751  WBC 9.1  HGB 14.2  HCT 42.4  MCV 91.2  PLT 338  Basic Metabolic Panel: Recent Labs  Lab 09/29/23 1751  NA 135  K 3.7  CL 98  CO2 25  GLUCOSE 126*  BUN 33*  CREATININE 1.20*  CALCIUM 10.1   GFR: Estimated Creatinine Clearance: 32 mL/min (A) (by C-G formula based on  SCr of 1.2 mg/dL (H)). Recent Labs  Lab 09/29/23 1751 09/29/23 2127  WBC 9.1  --   LATICACIDVEN  --  1.5    Liver Function Tests: Recent Labs  Lab 09/29/23 1751  AST 11*  ALT 10  ALKPHOS 101  BILITOT 0.6  PROT 7.3  ALBUMIN 4.6   Recent Labs  Lab 09/29/23 1751  LIPASE 20   No results for input(s): "AMMONIA" in the last 168 hours.  ABG    Component Value Date/Time   TCO2 28 11/14/2013 2349     Coagulation Profile: No results for input(s): "INR", "PROTIME" in the last 168 hours.  Cardiac Enzymes: No results for input(s): "CKTOTAL", "CKMB", "CKMBINDEX", "TROPONINI" in the last 168 hours.  HbA1C: No results found for: "HGBA1C"  CBG: No results for input(s): "GLUCAP" in the last 168 hours.  Review of Systems:   Patient is encephalopathic and/or intubated; therefore, history has been obtained from chart review.    Past Medical History:  She,  has a past medical history of Arthritis, Breast cancer (HCC) (2004), Cancer (HCC), CKD (chronic kidney disease), Clot (1996), DVT (deep venous thrombosis) (HCC) (1990's), Factor 5 Leiden mutation, heterozygous (HCC) (11/21/2013), GERD (gastroesophageal reflux disease), Headache(784.0), Heterozygous factor V Leiden mutation (HCC) (01/16/2014), Hyperlipidemia, Insomnia, Migraine, Osteoporosis, and Photophobia of both eyes.   Surgical History:   Past Surgical History:  Procedure Laterality Date   BREAST LUMPECTOMY  02/21/2003   BREAST SURGERY  1987   right lumpectomy for benign disease   EYE SURGERY     cataracts removed -IOL   KNEE SURGERY     MASTECTOMY Left 2004   L parial mastectomy   SENTINEL LYMPH NODE BIOPSY  02/21/2003   SHOULDER SURGERY Right 03/2010   rotator cuff   TONSILLECTOMY     TOTAL KNEE ARTHROPLASTY Right 08/09/2013   Procedure: TOTAL KNEE ARTHROPLASTY;  Surgeon: Velna Ochs, MD;  Location: [facility] OR;  Service: Orthopedics;  Laterality: Right;   TUBAL LIGATION       Social History:   reports that  she has never smoked. She has never used smokeless tobacco. She reports that she does not drink alcohol and does not use drugs.   Family History:  Her family history includes Heart disease in her father.   Allergies Allergies  Allergen Reactions   Aricept [Donepezil]     Bad dreams     Home Medications  Prior to Admission medications   Medication Sig Start Date End Date Taking? Authorizing Provider  acetaminophen (TYLENOL) 500 MG tablet Take 750 mg by mouth daily as needed for mild pain (pain score 1-3). 750 mg twice daily   Yes [provider]  HYDROcodone-acetaminophen (NORCO/VICODIN) 5-325 MG tablet Take 1 tablet by mouth every 4 (four) hours as needed. 09/28/23  Yes Tegeler, Canary Brim, MD  meloxicam (MOBIC) 15 MG tablet Take 15 mg by mouth daily.   Yes [provider]  methocarbamol (ROBAXIN) 500 MG tablet Take 500 mg by mouth 4 (four) times daily.   Yes [provider]  rivastigmine (EXELON) 4.6 mg/24hr Place 1 patch (4.6 mg total) onto the skin daily. 10/08/22  Yes Ihor Austin, NP  Salicylic Acid 50 % SOLN Apply 1 application  topically See admin instructions. Apply topically to affected area(s) at bedtime   Yes [provider]  sertraline (ZOLOFT) 25 MG tablet Take 25 mg by mouth daily.   Yes [provider]     Critical care time: 41 minutes     Joneen Roach, AGACNP-BC [facility]  See Amion for personal pager PCCM on call pager [phone] until 7pm. Please call Elink 7p-7a. [phone]  09/30/2023 3:17 AM

## 2023-09-30 NOTE — Anesthesia Procedure Notes (Signed)
 Procedure Name: Intubation Date/Time: 09/30/2023 1:22 AM  Performed by: Claudius Mich T, CRNAPre-anesthesia Checklist: Patient identified, Emergency Drugs available, Suction available and Patient being monitored Patient Re-evaluated:Patient Re-evaluated prior to induction Oxygen Delivery Method: Circle system utilized Preoxygenation: Pre-oxygenation with 100% oxygen Induction Type: IV induction, Rapid sequence and Cricoid Pressure applied Ventilation: Mask ventilation without difficulty Laryngoscope Size: Miller and 2 Grade View: Grade II Tube type: Oral Tube size: 7.0 mm Number of attempts: 1 Airway Equipment and Method: Stylet and Oral airway Placement Confirmation: ETT inserted through vocal cords under direct vision, positive ETCO2 and breath sounds checked- equal and bilateral Secured at: 22 cm Tube secured with: Tape Dental Injury: Teeth and Oropharynx as per pre-operative assessment

## 2023-09-30 NOTE — Procedures (Signed)
 Extubation Procedure Note  Patient Details:   Name: Samantha Ali DOB: 02/26/1946 MRN: [id number]   Airway Documentation:    Vent end date: 09/30/23 Vent end time: 0855   Evaluation  O2 sats: stable throughout Complications: No apparent complications Patient did tolerate procedure well. Bilateral Breath Sounds: Clear   Yes  Pt extubated to 3L [place] per MD order. Pt tolerated well, cuff leak present, no stridor noted, RN at bedside, CCM MD aware, RT will monitor as needed.   Thornell Mule 09/30/2023, 11:16 AM

## 2023-09-30 NOTE — Progress Notes (Addendum)
 Initial Nutrition Assessment  DOCUMENTATION CODES:   Non-severe (moderate) malnutrition in context of acute illness/injury  INTERVENTION:  Monitor diet advancement and add supplements as necessary  TPN per pharmacy  Pharmacy to add MVI with minerals and 100 mg Thiamine to TPN   NUTRITION DIAGNOSIS:   Moderate Malnutrition related to acute illness as evidenced by mild fat depletion, mild muscle depletion.   GOAL:   Patient will meet greater than or equal to 90% of their needs   MONITOR:   Labs, I & O's, Diet advancement  REASON FOR ASSESSMENT:   Consult New TPN/TNA  ASSESSMENT:  78 y.o female with PMH of GERD, DVT, HLD, CKD, osteoporosis, breast cancer (2004). Who presented with severe abdominal pain and constipation. Found to have duodenal ulcer perforation.    3/5 - s/p Laparoscopic graham patch of perforated duodenal bulb ulcer. JP drain placed.  Patient underwent graham patch of perforated duodenal ulcer this morning, remained intubated post op and was extubated this morning. NPO at least 5 days per general surgery days until able to undergo an UGI to ensure duodenal repair was successful. . TPN to start today. No BM for a few days. Exam showed mild fat and muscle wasting however edema noted on lower extremities that were unable to be assessed. No family at bedside.   Weight history revealed 7 lb, 5% weight loss in 5 months. However edema may be masking more significant weight loss. Not clinically significant. Suspect patient may be severely malnourished but exam and weight history limited.   Temp (24hrs), Avg:98.8 F (37.1 C), Min:97.7 F (36.5 C), Max:99.6 F (37.6 C)   Admit weight: 59 kg   Current weight: 59 kg  Last Weight  Most recent update: 09/29/2023 10:55 PM    Weight  59 kg (130 lb 1.1 oz)             Intake/Output Summary (Last 24 hours) at 09/30/2023 1319 Last data filed at 09/30/2023 1022 Gross per 24 hour  Intake 3768.08 ml  Output 1025 ml  Net  2743.08 ml   Net IO Since Admission: 2,743.08 mL [09/30/23 1319]  Drains/Lines: JP drain: 40 ml x 24 hours  NGT  Nutritionally Relevant Medications: Scheduled Meds:  Chlorhexidine Gluconate Cloth  6 each Topical Daily   enoxaparin (LOVENOX) injection  40 mg Subcutaneous Q24H   mouth rinse  15 mL Mouth Rinse Q2H   rivastigmine  4.6 mg Transdermal Daily   Continuous Infusions:  [START ON 10/01/2023] fluconazole (DIFLUCAN) IV     lactated ringers 75 mL/hr at 09/30/23 0700   lactated ringers     piperacillin-tazobactam (ZOSYN)  IV 12.5 mL/hr at 09/30/23 0700   TPN ADULT (ION)     Labs Reviewed: Calcium 8.7 CBG ranges from 123-124 mg/dL over the last 24 hours   NUTRITION - FOCUSED PHYSICAL EXAM:  Flowsheet Row Most Recent Value  Orbital Region Mild depletion  Upper Arm Region Mild depletion  Thoracic and Lumbar Region Mild depletion  Buccal Region Mild depletion  Temple Region Mild depletion  Clavicle Bone Region Mild depletion  Clavicle and Acromion Bone Region Mild depletion  Scapular Bone Region Moderate depletion  Dorsal Hand Unable to assess  [MItts]  Patellar Region Unable to assess  [Edema]  Anterior Thigh Region Unable to assess  [Edema]  Posterior Calf Region Unable to assess  [Sleeves]  Edema (RD Assessment) Moderate  [Edema on legs]  Hair Reviewed  Eyes Reviewed  Mouth Reviewed  Skin Reviewed  [Dry flaky, red  spots]  Nails Reviewed       Diet Order:   Diet Order             Diet NPO time specified  Diet effective now                   EDUCATION NEEDS:   Not appropriate for education at this time  Skin:  Skin Assessment: Skin Integrity Issues: Skin Integrity Issues:: Incisions Incisions: Abdomen  Last BM:  PTA  Height:   Ht Readings from Last 1 Encounters:  09/29/23 5\' 3"  (1.6 m)    Weight:   Wt Readings from Last 1 Encounters:  09/29/23 59 kg    Ideal Body Weight:  52.3 kg  BMI:  Body mass index is 23.04 kg/m.  Estimated  Nutritional Needs:   Kcal:  1500-1700 kcal  Protein:  70-90 gm  Fluid:  > 1.5L/day   Elliot Dally, RD Registered Dietitian  See Amion for more information

## 2023-09-30 NOTE — Transfer of Care (Signed)
 Immediate Anesthesia Transfer of Care Note  Patient: Samantha Ali  Procedure(s) Performed: LAPAROSCOPIC Peggye Ley OF ULCER  Patient Location: ICU  Anesthesia Type:General  Level of Consciousness: Patient remains intubated per anesthesia plan  Airway & Oxygen Therapy: Patient remains intubated per anesthesia plan and Patient placed on Ventilator (see vital sign flow sheet for setting)  Post-op Assessment: Report given to RN and Post -op Vital signs reviewed and stable  Post vital signs: Reviewed and stable  Last Vitals:  Vitals Value Taken Time  BP 124/62 09/30/23 0300  Temp    Pulse 88 09/30/23 0322  Resp 16 09/30/23 0322  SpO2 100 % 09/30/23 0322  Vitals shown include unfiled device data.  Last Pain:  Vitals:   09/29/23 2210  TempSrc: Tympanic  PainSc:          Complications: No notable events documented.

## 2023-09-30 NOTE — Progress Notes (Signed)
 PCCM Progress Note  Called to bedside for tachycardia 160s and hypertension with SBP 200s post-PICC line procedure placement.  Labetalol 5 mg given x 2 with improvement to HR 110s and SBP 150s.  CXR with no acute abnormalities  EKG with no ST changes or TWI  No further needs at this time. Family updated at bedside.

## 2023-09-30 NOTE — Anesthesia Procedure Notes (Signed)
 Arterial Line Insertion Start/End3/11/2023 1:30 AM, 09/30/2023 1:35 AM Performed by: Rachel Moulds, CRNA, CRNA  Patient location: OR. Emergency situation Patient sedated Left, radial was placed Catheter size: 20 G Hand hygiene performed  and maximum sterile barriers used   Attempts: 1 Procedure performed without using ultrasound guided technique. Following insertion, dressing applied and Biopatch. Post procedure assessment: normal  Patient tolerated the procedure well with no immediate complications.

## 2023-09-30 NOTE — TOC Initial Note (Signed)
 Transition of Care [facility]) - Initial/Assessment Note    Patient Details  Name: Samantha Ali MRN: [id number] Date of Birth: 02/16/1946  Transition of Care [facility]) CM/SW Contact:    Lamonte Sakai, Student-Social Work Phone Number: 09/30/2023, 9:59 AM  Clinical Narrative:                  Pt admitted from [facility] due to ruptured ulcer in intestine. Pt currently intubated- TOC following for medical progression.       Patient Goals and CMS Choice            Expected Discharge Plan and Services       Living arrangements for the past 2 months: Single Family Home                                      Prior Living Arrangements/Services Living arrangements for the past 2 months: Single Family Home Lives with:: Spouse                   Activities of Daily Living      Permission Sought/Granted                  Emotional Assessment   Attitude/Demeanor/Rapport: Intubated (Following Commands or Not Following Commands) Affect (typically observed): Unable to Assess Orientation: :  (Intubated)      Admission diagnosis:  Perforated ulcer of intestine (HCC) [K63.1] S/P exploratory laparotomy [Z98.890] Perforated duodenal ulcer (HCC) [K26.5] Patient Active Problem List   Diagnosis Date Noted   S/P exploratory laparotomy 09/30/2023   Perforated ulcer of intestine (HCC) 09/30/2023   Endotracheal tube present 09/30/2023   Heterozygous factor V Leiden mutation (HCC) 01/16/2014   DVT (deep venous thrombosis) (HCC) 11/15/2013   Total knee replacement status 08/09/2013   Chest pain 08/04/2012   Palpitations 08/04/2012   Septic joint of right knee joint (HCC) 06/01/2011   Breast cancer (HCC) 05/29/2011   PCP:  Sigmund Hazel, MD Pharmacy:   [facility] [place], [place] - [address] [place] [address] Phone: [phone] Fax: [phone]     Social Drivers of Health (SDOH) Social  History: SDOH Screenings   Social Connections: Unknown (12/08/2021)   Received from [name]  Tobacco Use: Low Risk  (09/28/2023)   SDOH Interventions:     Readmission Risk Interventions     No data to display

## 2023-09-30 NOTE — Op Note (Signed)
 Patient: Samantha Ali (Oct 14, 1945, [id number])  Date of Surgery: 09/30/2023  Preoperative Diagnosis: Pnemoperitoneum   Postoperative Diagnosis: Perforated duodenal bulb ulcer with pneumoperitoneum and purulent peritonitis  Surgical Procedure: Laparoscopic graham patch of perforated duodenal bulb ulcer  Operative Team Members:  Surgeons and Role:    * Noe Goyer, Hyman Hopes, MD - Primary   Anesthesiologist: Gaynelle Adu, MD   Anesthesia: General   Fluids:  Total I/O In: 1994.9 [IV Piggyback:1994.9] Out: -   Complications: * No complications entered in OR log *  Drains:  (19 Fr) Jackson-Pratt drain(s) with closed bulb suction in the right upper quadrant near the graham patch exiting the right abdomen    Specimen: * No specimens in log *   Disposition:  ICU - intubated and hemodynamically stable.  Plan of Care: Admit to inpatient     Indications for Procedure:  Samantha Ali is an 78 y.o. female with recent NSAID use for back pain issues and constipation who presents with severe abdominal pain, peritonitis on exam and pneumoperitoneum on imaging.  The CT suggests she has a perforated pyloric channel ulcer, but it is possible she also has colon pathology with her recent constipation issues.  I recommend emergent diagnostic laparoscopy, possible open exploratory laparotomy.  We discussed the procedure, its risks, benefits and alternatives.  I explained possible graham patch with JP drain placement.  I explained possible bowel resection with colostomy.  The risks discussed included but were not limited to the risk of infection, bleeding, damage to nearby structures, need for prolonged intubation and need for prolonged hospital stay with additional procedures.  After a full discussion and all questions answered the patient granted consent to proceed.  We will proceed emergently to the OR.    Findings: Perforated duodenal bulb ulcer with pneumoperitoneum and purulent  peritonitis   Description of Procedure:   On the date stated above the patient was taken operating room suite and placed in supine position.  General endotracheal anesthesia was induced.  A timeout was completed verifying the correct patient, procedure, position, and equipment needed for the case.  The patient's abdomen was prepped and draped in usual sterile fashion.  Antibiotics were given prior to the case start.  I made a 5 mm incision at the umbilicus and elevated the umbilical stalk using a Coker clamp and inserted of Veress needle into the abdomen at this position and inflated the abdomen to 15 mmHg.  2 additional 5 mm trocars were placed on either side of the umbilicus.  The abdomen was inspected.  There was purulent peritonitis and severe contamination of the right upper quadrant.  I used the suction irrigator to clean the field is much as possible.  The perforation was identified on the anterior portion of the duodenal bulb.  The falciform ligament was mobilized off the abdominal wall using bipolar energy.  This was used to patch the ulcer defect.  2 Vicryl sutures were used to take bites on either side of the ulcer and healthy duodenal tissue and tie the falciform ligament down to the ulcer defect in a typical Graham patch technique.  There was good coverage of the ulcer defect.  I suctioned out bilateral upper quadrants and inspected the pelvis without significant amount of purulence identified.  A 19 round JP drain was introduced through the right trocar site and positioned near the gallbladder and infrahepatic space overlying the duodenal repair.  This was sutured to the skin using nylon.  The abdomen was then deflated  and the skin was closed using 4-0 Monocryl and Dermabond.  All sponge needle counts were correct at the end of this case.   At the end of the case we reviewed the infection status of the case. Patient: Samantha Ali Emergency General Surgery Service Patient Case:  Emergent Infection Present At Time Of Surgery (PATOS): Purulent Peritonitis  Ivar Drape, MD General, Bariatric, & Minimally Invasive Surgery [facility], [place]

## 2023-10-01 ENCOUNTER — Encounter (HOSPITAL_COMMUNITY): Payer: Self-pay | Admitting: Surgery

## 2023-10-01 DIAGNOSIS — F03918 Unspecified dementia, unspecified severity, with other behavioral disturbance: Secondary | ICD-10-CM

## 2023-10-01 DIAGNOSIS — F05 Delirium due to known physiological condition: Secondary | ICD-10-CM | POA: Diagnosis not present

## 2023-10-01 DIAGNOSIS — Z9889 Other specified postprocedural states: Secondary | ICD-10-CM

## 2023-10-01 DIAGNOSIS — D6851 Activated protein C resistance: Secondary | ICD-10-CM

## 2023-10-01 DIAGNOSIS — Z86718 Personal history of other venous thrombosis and embolism: Secondary | ICD-10-CM

## 2023-10-01 DIAGNOSIS — E876 Hypokalemia: Secondary | ICD-10-CM

## 2023-10-01 DIAGNOSIS — K631 Perforation of intestine (nontraumatic): Secondary | ICD-10-CM | POA: Diagnosis not present

## 2023-10-01 DIAGNOSIS — E440 Moderate protein-calorie malnutrition: Secondary | ICD-10-CM | POA: Insufficient documentation

## 2023-10-01 LAB — COMPREHENSIVE METABOLIC PANEL
ALT: 18 U/L (ref 0–44)
AST: 16 U/L (ref 15–41)
Albumin: 2.8 g/dL — ABNORMAL LOW (ref 3.5–5.0)
Alkaline Phosphatase: 64 U/L (ref 38–126)
Anion gap: 5 (ref 5–15)
BUN: 11 mg/dL (ref 8–23)
CO2: 23 mmol/L (ref 22–32)
Calcium: 7.9 mg/dL — ABNORMAL LOW (ref 8.9–10.3)
Chloride: 108 mmol/L (ref 98–111)
Creatinine, Ser: 0.76 mg/dL (ref 0.44–1.00)
GFR, Estimated: >60 mL/min (ref >60)
Glucose, Bld: 137 mg/dL — ABNORMAL HIGH (ref 70–99)
Potassium: 3.4 mmol/L — ABNORMAL LOW (ref 3.5–5.1)
Sodium: 136 mmol/L (ref 135–145)
Total Bilirubin: 0.5 mg/dL (ref 0.0–1.2)
Total Protein: 5.6 g/dL — ABNORMAL LOW (ref 6.5–8.1)

## 2023-10-01 LAB — CBC
HCT: 36.6 % (ref 36.0–46.0)
Hemoglobin: 12.1 g/dL (ref 12.0–15.0)
MCH: 30 pg (ref 26.0–34.0)
MCHC: 33.1 g/dL (ref 30.0–36.0)
MCV: 90.8 fL (ref 80.0–100.0)
Platelets: 267 10*3/uL (ref 150–400)
RBC: 4.03 MIL/uL (ref 3.87–5.11)
RDW: 13.7 % (ref 11.5–15.5)
WBC: 13.2 10*3/uL — ABNORMAL HIGH (ref 4.0–10.5)
nRBC: 0 % (ref 0.0–0.2)

## 2023-10-01 LAB — MAGNESIUM: Magnesium: 2 mg/dL (ref 1.7–2.4)

## 2023-10-01 LAB — GLUCOSE, CAPILLARY
Glucose-Capillary: 164 mg/dL — ABNORMAL HIGH (ref 70–99)
Glucose-Capillary: 172 mg/dL — ABNORMAL HIGH (ref 70–99)
Glucose-Capillary: 109 mg/dL — ABNORMAL HIGH (ref 70–99)
Glucose-Capillary: 137 mg/dL — ABNORMAL HIGH (ref 70–99)

## 2023-10-01 LAB — PHOSPHORUS: Phosphorus: 2.1 mg/dL — ABNORMAL LOW (ref 2.5–4.6)

## 2023-10-01 MED ORDER — METOPROLOL TARTRATE 5 MG/5ML IV SOLN
5.0000 mg | Freq: Three times a day (TID) | INTRAVENOUS | Status: DC | PRN
Start: 2023-10-01 — End: 2023-10-08
  Filled 2023-10-01: qty 5

## 2023-10-01 MED ORDER — POTASSIUM PHOSPHATES 15 MMOLE/5ML IV SOLN
30.0000 mmol | Freq: Once | INTRAVENOUS | Status: AC
  Administered 2023-10-01: 30 mmol via INTRAVENOUS
  Filled 2023-10-01: qty 10

## 2023-10-01 MED ORDER — INSULIN ASPART 100 UNIT/ML IJ SOLN
0.0000 [IU] | INTRAMUSCULAR | Status: DC
  Administered 2023-10-01 (×2): 2 [IU] via SUBCUTANEOUS
  Administered 2023-10-01 – 2023-10-02 (×3): 1 [IU] via SUBCUTANEOUS

## 2023-10-01 MED ORDER — METOPROLOL TARTRATE 5 MG/5ML IV SOLN
5.0000 mg | Freq: Once | INTRAVENOUS | Status: AC
  Administered 2023-10-01: 5 mg via INTRAVENOUS

## 2023-10-01 MED ORDER — TRAVASOL 10 % IV SOLN
INTRAVENOUS | Status: AC
  Filled 2023-10-01: qty 700.1

## 2023-10-01 MED ORDER — POTASSIUM CHLORIDE 10 MEQ/50ML IV SOLN
10.0000 meq | INTRAVENOUS | Status: AC
  Administered 2023-10-01 (×2): 10 meq via INTRAVENOUS
  Filled 2023-10-01 (×2): qty 50

## 2023-10-01 MED ORDER — ACETAMINOPHEN 10 MG/ML IV SOLN
1000.0000 mg | Freq: Four times a day (QID) | INTRAVENOUS | Status: AC
  Administered 2023-10-01 – 2023-10-02 (×4): 1000 mg via INTRAVENOUS
  Filled 2023-10-01 (×3): qty 100

## 2023-10-01 NOTE — Progress Notes (Addendum)
 Trauma Event Note   Notified by primary RN that patient is agitated and pulling at lines, removed NG tube. Not really following commands. Attempted to replace however pt is not participating in procedure, unable to place successfully. Will not swallow during procedure. MD notified via text. Plan for strict NPO and consult for NG placement under fluoro in AM.  Last imported Vital Signs BP (!) 180/83 (BP Location: Left Arm)   Pulse 90   Temp 99.3 F (37.4 C) (Oral)   Resp (!) 24   Ht 5\' 3"  (1.6 m)   Wt 59 kg   SpO2 93%   BMI 23.04 kg/m   Trending CBC Recent Labs    09/29/23 1751 09/30/23 0330 09/30/23 0345 09/30/23 0541  WBC 9.1 8.4  --  8.7  HGB 14.2 12.0 11.6* 11.4*  HCT 42.4 35.6* 34.0* 34.1*  PLT 338 247  --  257    Trending Coag's No results for input(s): "APTT", "INR" in the last 72 hours.  Trending BMET Recent Labs    09/29/23 1751 09/30/23 0330 09/30/23 0345  NA 135 138 136  K 3.7 3.8 3.8  CL 98 107  --   CO2 25 22  --   BUN 33* 18  --   CREATININE 1.20* 0.72  --   GLUCOSE 126* 164*  --       Sharise Lippy O Leelynd Maldonado  Trauma Response RN  Please call TRN at [phone] for further assistance.

## 2023-10-01 NOTE — Consult Note (Addendum)
 Initial Consultation Note   Patient: Samantha Ali ZOX:[id number] DOB: 14-Apr-1946 PCP: Sigmund Hazel, MD DOA: 09/29/2023 DOS: the patient was seen and examined on 10/01/2023 Primary service: Montez Morita Md, MD  Referring physician: [facility] Reason for consult: Medical management  Assessment/Plan: Samantha Ali is a 78 year old female with history of dementia and recurrent DVT with history of heterozygote factor V Leiden mutation who presented with abdominal pain and was found to have duodenal bulb perforation with emergent laparoscopic repair on 3/5  Assessment and Plan:  Sepsis secondary to duodenal bulb perforation s/p laparoscopic repair Peritonitis secondary to perforation Patient presented with abdominal pain found to have a perforation meeting sepsis criteria.  Status post laparoscopic Cheree Ditto patch of perforated duodenal bulb ulcer by Dr. Dossie Der on 09/30/23.  Blood cultures were obtained and pending.  Placed on empiric antibiotics of Zosyn and fluconazole.  Today labs note WBC 13.2 which is higher than prior Procedure.  Patient had NGT in place, but ripped it out overnight.  Started on TPN through PICC.  Plans for upper GI study 3/7. -Follow-up blood cultures -N.p.o. -Continue empiric antibiotics of Zosyn and fluconazole -Recheck CBC in a.m.  Acute respiratory failure has been ruled out, post op vent management only  Resolved.  Patient had remained intubated temporarily postoperatively to the ICU PCCM consulted.  Patient was able to be extubated later that morning.  Transferred out of ICU to progressive bed 3/6.  O2 saturations currently maintained on room air.  Delirium Dementia Patient was noted to be agitated and pulled out NG tube overnight.  Records note patient followed by neurology in outpatient setting for mild/moderate dementia, but reportedly had been able to perform her ADLs.  Treated with a rivastigmine patch as unable to tolerate memantine and donepezil previously.  Recent dosage if  needed -Delirium precautions -Continue soft restraints -Consider limiting sedating -Continue rivastigmine patch  Essential hypertension Blood pressures elevated up to 180/83.  Patient did not appear to previously be on medications for treatment. -Started on metoprolol IV scheduled  History of recurrent DVT Heterozygote factor 5 Leiden mutation Patient with prior history of DVT  in the 1990s and subsequent 07/2013 after knee replacement was thought to be provoked.  Records note patient was about to complete her course for Xarelto when found to have extension of prior DVT requiring hospitalization 10/2013.  Questioned possible failure of Xarelto.  She was switched to Coumadin which she continued on Xarelto until 06/2014 when it was discontinued by her primary.  Records make note patient was supposed to be referred to oncology.  Given history of 3 DVTs in the past, but suspect patient needs to be on lifelong anticoagulation. -Subcutaneous Lovenox, but would suspect patient would need to be on some form of anticoagulation given history -Consider discussing with hematology   Hypokalemia -Continue to monitor and replace as needed  Acute kidney injury Resolved.  Creatinine 0.76 today.  Initially admission creatinine noted to be elevated up to 1.2 with BUN 33.  Improved with IV fluids.    TRH will continue to follow the patient.  HPI: Samantha Ali is a 78 y.o. female with past medical history of hyperlipidemia, CKD, history of DVT 1990s and subsequent in 2015 with factor V Leiden mutation, history of breast cancer, and osteoporosis who presented with complaints of acute onset of abdominal pain on 3/4.  CT scan of the abdomen showed perforated duodenal versus gastric ulcer.  General surgery formally consulted..  Patient underwent emergent diagnostic laparotomy on 3/5 for perforation was  identified in the anterior portion of the duodenal bulb.  There was purulent peritonitis appreciated.  JP drain was  placed and patient was closed patient was transferred to the ICU as still intubated with PCCM.  Currently on empiric antibiotics of Zosyn and fluconazole.  Patient has PICC line in place receiving TPN.  Overnight patient had been noted to be agitated and pulled out her nasogastric tube.  At this time patient appears calm and patient denies any complaints. Vitals noted temperature up to 101 F with tachycardia, tachypnea, blood pressures elevated up to 180/83.  Records note patient had not been on any blood pressure medications at baseline.  Review of Systems: As mentioned in the history of present illness. All other systems reviewed and are negative. Past Medical History:  Diagnosis Date   Arthritis    R knee   Breast cancer (HCC) 2004   chemo   Cancer (HCC)    CKD (chronic kidney disease)    Clot 1996   in the right leg- in the muscle     DVT (deep venous thrombosis) (HCC) 1990's   probably related to positive phlebitis   Factor 5 Leiden mutation, heterozygous (HCC) 11/21/2013   GERD (gastroesophageal reflux disease)    Headache(784.0)    migraines- most often 2-3 times /week    Heterozygous factor V Leiden mutation (HCC) 01/16/2014   Hyperlipidemia    Insomnia    Migraine    Osteoporosis    Photophobia of both eyes    unknown etiology   Past Surgical History:  Procedure Laterality Date   BREAST LUMPECTOMY  02/21/2003   BREAST SURGERY  1987   right lumpectomy for benign disease   EYE SURGERY     cataracts removed -IOL   KNEE SURGERY     LAPAROSCOPY N/A 09/30/2023   Procedure: LAPAROSCOPIC [facility] OF [facility]LC[facility];  Surgeon: Quentin Ore, MD;  Location: [facility] OR;  Service: General;  Laterality: N/A;   MASTECTOMY Left 2004   L parial mastectomy   SENTINEL LYMPH NODE BIOPSY  02/21/2003   SHOULDER SURGERY Right 03/2010   rotator cuff   TONSILLECTOMY     TOTAL KNEE ARTHROPLASTY Right 08/09/2013   Procedure: TOTAL KNEE ARTHROPLASTY;  Surgeon: Velna Ochs, MD;  Location: [facility]  OR;  Service: Orthopedics;  Laterality: Right;   TUBAL LIGATION     Social History:  reports that she has never smoked. She has never used smokeless tobacco. She reports that she does not drink alcohol and does not use drugs.  Allergies  Allergen Reactions   Aricept [Donepezil]     Bad dreams   Nsaids     Perf duodenal ulcer    Family History  Problem Relation Age of Onset   Heart disease Father     Prior to Admission medications   Medication Sig Start Date End Date Taking? Authorizing Provider  acetaminophen (TYLENOL) 500 MG tablet Take 750 mg by mouth daily as needed for mild pain (pain score 1-3). 750 mg twice daily   Yes [provider]  HYDROcodone-acetaminophen (NORCO/VICODIN) 5-325 MG tablet Take 1 tablet by mouth every 4 (four) hours as needed. 09/28/23  Yes Tegeler, Canary Brim, MD  methocarbamol (ROBAXIN) 500 MG tablet Take 500 mg by mouth 4 (four) times daily.   Yes [provider]  rivastigmine (EXELON) 4.6 mg/24hr Place 1 patch (4.6 mg total) onto the skin daily. 10/08/22  Yes McCue, Shanda Bumps, NP  Salicylic Acid 50 % SOLN Apply 1 application  topically  See admin instructions. Apply topically to affected area(s) at bedtime   Yes [provider]  sertraline (ZOLOFT) 25 MG tablet Take 25 mg by mouth daily.   Yes [provider]    Physical Exam: Vitals:   09/30/23 2300 10/01/23 0317 10/01/23 0500 10/01/23 0800  BP: (!) 180/83 (!) 151/69  (!) 170/81  Pulse:  81  93  Resp: (!) 24 20  20   Temp: 99.3 F (37.4 C) 98.9 F (37.2 C)  98 F (36.7 C)  TempSrc: Oral Oral  Oral  SpO2: 93% 97%  99%  Weight:   61.6 kg   Height:        Constitutional: Elderly female appears to be in no acute distress at this time unable to answer questions. Eyes: PERRL, lids and conjunctivae normal ENMT: Mucous membranes are dry  Normal dentition.  Neck: normal, supple  Respiratory: clear to auscultation bilaterally, no wheezing, no crackles. Normal  respiratory effort. No accessory muscle use.  Cardiovascular: Regular rate and rhythm, no murmurs / rubs / gallops. No extremity edema. 2+ pedal pulses.  PICC line in right upper extremity Abdomen: Some mild tenderness palpation of the abdomen appreciated.  JP  drain in place. Musculoskeletal: no clubbing / cyanosis. No joint deformity upper and lower extremities. Good ROM, no contractures. Normal muscle tone.  Skin: no rashes, lesions, ulcers. No induration Neurologic: CN 2-12 grossly intact. Strength 5/5 in all 4.  Psychiatric: Alert and oriented only to self.  Thinks it is 1940 and is not sure where she is  Data Reviewed:    reviewed labs, imaging, and pertinent records as documented in this note   Family Communication:  Primary team communication:  Thank you very much for involving [place] in the care of your patient.  Author: Clydie Braun, MD 10/01/2023 10:39 AM  For on call review [email or web address].

## 2023-10-01 NOTE — [place] FL2 (Signed)
 [place] MEDICAID [place]2 LEVEL OF CARE FORM     IDENTIFICATION  Patient Name: Samantha Ali Birthdate: Jul 17, 1946 Sex: female Admission Date (Current Location): 09/29/2023  [facility] and [place] Number:  Producer, television/film/video and Address:  [facility] [place], [address] [place], [place] [address]      Provider Number: [id number]  Attending Physician Name and Address:  Montez Morita, Md, MD  Relative Name and Phone Number:  Evette, Diclemente [phone]  [phone]    Current Level of Care: Hospital Recommended Level of Care: Skilled Nursing Facility Prior Approval Number:    Date Approved/Denied:   PASRR Number: [id number]  Discharge Plan: SNF    Current Diagnoses: Patient Active Problem List   Diagnosis Date Noted   Malnutrition of moderate degree 10/01/2023   S/P exploratory laparotomy 09/30/2023   Perforated ulcer of intestine (HCC) 09/30/2023   Endotracheal tube present 09/30/2023   Heterozygous factor V Leiden mutation (HCC) 01/16/2014   DVT (deep venous thrombosis) (HCC) 11/15/2013   Total knee replacement status 08/09/2013   Chest pain 08/04/2012   Palpitations 08/04/2012   Septic joint of right knee joint (HCC) 06/01/2011   Breast cancer (HCC) 05/29/2011    Orientation RESPIRATION BLADDER Height & Weight     Self  Normal Incontinent, Indwelling catheter Weight: 135 lb 12.9 oz (61.6 kg) Height:  5\' 3"  (160 cm)  BEHAVIORAL SYMPTOMS/MOOD NEUROLOGICAL BOWEL NUTRITION STATUS      Continent Diet (see discharge summary)  AMBULATORY STATUS COMMUNICATION OF NEEDS Skin   Total Care Verbally Surgical wounds                       Personal Care Assistance Level of Assistance  Bathing, Feeding, Dressing Bathing Assistance: Maximum assistance Feeding assistance: Maximum assistance Dressing Assistance: Maximum assistance     Functional Limitations Info  Sight, Hearing, Speech Sight Info: Adequate Hearing Info: Adequate Speech Info: Adequate     SPECIAL CARE FACTORS FREQUENCY  PT (By licensed PT), OT (By licensed OT)     PT Frequency: 5x week OT Frequency: 5x week            Contractures Contractures Info: Not present    Additional Factors Info  Code Status, Allergies, Insulin Sliding Scale Code Status Info: full Allergies Info: Aricept (Donepezil), Nsaids   Insulin Sliding Scale Info: Novolog: see discharge summary       Current Medications (10/01/2023):  This is the current hospital active medication list Current Facility-Administered Medications  Medication Dose Route Frequency Provider Last Rate Last Admin   acetaminophen (OFIRMEV) IV 1,000 mg  1,000 mg Intravenous Q6H Eric Form, PA-C 400 mL/hr at 10/01/23 0841 1,000 mg at 10/01/23 0841   Chlorhexidine Gluconate Cloth 2 % PADS 6 each  6 each Topical Daily Stechschulte, Hyman Hopes, MD   6 each at 10/01/23 1050   enoxaparin (LOVENOX) injection 40 mg  40 mg Subcutaneous Q24H Emelia Loron, MD   40 mg at 10/01/23 1050   fluconazole (DIFLUCAN) IVPB 400 mg  400 mg Intravenous Q24H Juliette Mangle, [facility]   Stopped at 10/01/23 4132   hydrALAZINE (APRESOLINE) injection 10 mg  10 mg Intravenous Q6H PRN Luciano Cutter, MD   10 mg at 10/01/23 1147   HYDROmorphone (DILAUDID) injection 0.5 mg  0.5 mg Intravenous Q2H PRN Luciano Cutter, MD   0.5 mg at 09/30/23 2243   insulin aspart (novoLOG) injection 0-9 Units  0-9 Units Subcutaneous Q4H Calton Dach  I, RPH   2 Units at 10/01/23 1248   labetalol (NORMODYNE) injection 5 mg  5 mg Intravenous Once Luciano Cutter, MD       lactated ringers infusion   Intravenous Continuous Calton Dach I, RPH 45 mL/hr at 09/30/23 2124 New Bag at 09/30/23 2124   methocarbamol (ROBAXIN) injection 500 mg  500 mg Intravenous Q6H PRN Stechschulte, Hyman Hopes, MD   500 mg at 09/30/23 1534   metoprolol tartrate (LOPRESSOR) injection 5 mg  5 mg Intravenous Q8H PRN Jacinto Halim, PA-C       Oral care mouth rinse  15 mL Mouth  Rinse PRN Stechschulte, Hyman Hopes, MD       piperacillin-tazobactam (ZOSYN) IVPB 3.375 g  3.375 g Intravenous Q8H Stechschulte, Hyman Hopes, MD 12.5 mL/hr at 10/01/23 1408 3.375 g at 10/01/23 1408   potassium PHOSPHATE 30 mmol in dextrose 5 % 500 mL infusion  30 mmol Intravenous Once Calton Dach I, RPH 85 mL/hr at 10/01/23 1108 30 mmol at 10/01/23 1108   rivastigmine (EXELON) 4.6 mg/24hr 4.6 mg  4.6 mg Transdermal Daily Stechschulte, Hyman Hopes, MD   4.6 mg at 10/01/23 1052   sodium chloride flush (NS) 0.9 % injection 10-40 mL  10-40 mL Intracatheter Q12H Stechschulte, Hyman Hopes, MD   10 mL at 10/01/23 1051   sodium chloride flush (NS) 0.9 % injection 10-40 mL  10-40 mL Intracatheter PRN Stechschulte, Hyman Hopes, MD       TPN ADULT (ION)   Intravenous Continuous TPN Francena Hanly, RPH 30 mL/hr at 09/30/23 1828 New Bag at 09/30/23 1828   TPN ADULT (ION)   Intravenous Continuous TPN Calton Dach I, [facility]         Discharge Medications: Please see discharge summary for a list of discharge medications.  Relevant Imaging Results:  Relevant Lab Results:   Additional Information SSN: [id number]  Lorri Frederick, LCSW

## 2023-10-01 NOTE — Progress Notes (Signed)
 Notified by RN that patient was hypertensive and tachycardic but did not meet criteria for PRN metoprolol.   Tachycardia is new from this am so I came to bedside to re-eval. HR currently 119-120, sinus tach on monitor. SBP 170. Last temp 99.4. On RA.   Patient is more awake and alert then this am. A&O x 1. She denies any abdominal pain. Foley was just removed by RN. She reports UOP since 7am.   She was not on a BB prior to admission.   Blood pressure (!) 174/88, pulse 94, temperature (P) 99.4 F (37.4 C), resp. rate (!) 27, height 5\' 3"  (1.6 m), weight 61.6 kg, SpO2 100%. Gen:  Alert, NAD, pleasant Card:  Reg Pulm:  CTA b/l, normal rate and effort.  Abd: Soft, mild distension, mild ttp around her incision and epigastrium that appears stable/improved form this am. No rigidity or guarding. Incisions, cdi. JP SS Ext:  No LE edema  Neuro: Pupils equal and round, EOMI, CN 3-12 grossly intact, mae's to command  TRH has seen. Note pending for recs.  Will give 1 time dose of IV Metoprolol. Cont cardiac monitoring. PRN meds written for.  Cont current plan for AM labs and UGI. Strict NPO. TPN. Cont abx.  Bladder scan in 6 hours if she does not void. Strict I/O.  RN to notify if any changes.   Jacinto Halim, PA-C

## 2023-10-01 NOTE — TOC Initial Note (Signed)
 Transition of Care [facility]) - Initial/Assessment Note    Patient Details  Name: Samantha Ali MRN: [id number] Date of Birth: September 28, 1945  Transition of Care [facility]) CM/SW Contact:    Lorri Frederick, LCSW Phone Number: 10/01/2023, 3:38 PM  Clinical Narrative:     Pt oriented x1, unable to provide information.  CSW spoke with husband Molly Maduro regarding PT recommendation for SNF.  Pt from home with Molly Maduro, no current services.  Molly Maduro said he wants to discuss SNF with pt before making decision, however, he does give permission for CSW to send out SNF referral to begin looking for options.  Attempt also made to contact son Deniece Portela but was not able to speak with him.   Referral sent out in hub for SNF.              Expected Discharge Plan: Skilled Nursing Facility Barriers to Discharge: Continued Medical Work up   Patient Goals and CMS Choice            Expected Discharge Plan and Services       Living arrangements for the past 2 months: Single Family Home                                      Prior Living Arrangements/Services Living arrangements for the past 2 months: Single Family Home Lives with:: Spouse Patient language and need for interpreter reviewed:: Yes        Need for Family Participation in Patient Care: Yes (Comment) Care giver support system in place?: Yes (comment)   Criminal Activity/Legal Involvement Pertinent to Current Situation/Hospitalization: No - Comment as needed  Activities of Daily Living      Permission Sought/Granted                  Emotional Assessment Appearance:: Appears stated age Attitude/Demeanor/Rapport: Unable to Assess Affect (typically observed): Unable to Assess Orientation: : Oriented to Self      Admission diagnosis:  Perforated ulcer of intestine (HCC) [K63.1] S/P exploratory laparotomy [Z98.890] Perforated duodenal ulcer (HCC) [K26.5] Patient Active Problem List   Diagnosis Date Noted   Malnutrition of  moderate degree 10/01/2023   S/P exploratory laparotomy 09/30/2023   Perforated ulcer of intestine (HCC) 09/30/2023   Endotracheal tube present 09/30/2023   Heterozygous factor V Leiden mutation (HCC) 01/16/2014   DVT (deep venous thrombosis) (HCC) 11/15/2013   Total knee replacement status 08/09/2013   Chest pain 08/04/2012   Palpitations 08/04/2012   Septic joint of right knee joint (HCC) 06/01/2011   Breast cancer (HCC) 05/29/2011   PCP:  Sigmund Hazel, MD Pharmacy:   [facility] [place], [place] - [address] [facility] [address] [place] [address] Phone: [phone] Fax: [phone]     Social Drivers of Health (SDOH) Social History: SDOH Screenings   Food Insecurity: No Food Insecurity (09/30/2023)  Housing: Low Risk  (09/30/2023)  Transportation Needs: No Transportation Needs (09/30/2023)  Utilities: Not At Risk (09/30/2023)  Social Connections: Unknown (09/30/2023)  Tobacco Use: Low Risk  (09/28/2023)   SDOH Interventions:     Readmission Risk Interventions     No data to display

## 2023-10-01 NOTE — Evaluation (Signed)
 Physical Therapy Evaluation Patient Details Name: Samantha Ali MRN: [id number] DOB: 1946-03-24 Today's Date: 10/01/2023  History of Present Illness  The pt is a 78 yo female presenting 3/4 with chest pain and lower abdominal pain, CT showed ruptured ulcer in intestine. S/P laparoscopic patch of perforated ulcer 3/5. PMH includes: arthritis, breast cancer, CKD, DVT, GERD, HLD, R TKA, and R rotator cuff surgery.   Clinical Impression  Pt in bed upon arrival of PT, spouse present and agreeable to evaluation as pt asleep upon my arrival. Prior to admission the pt was independent with all mobility, no use of DME, no recent falls, and independent with simple meals and self-care while spouse works. This evaluation was limited by pt arousal as she was given Haldol earlier this morning before session due to restlessness overnight and pulling out NG tube. The pt therefore required significant assist at this time, and is not safe to return home alone at this time. Will continue to progress and update d/c recommendation as pt is better able to participate in session.        If plan is discharge home, recommend the following: Two people to help with walking and/or transfers;Two people to help with bathing/dressing/bathroom;Assistance with cooking/housework;Assistance with feeding;Direct supervision/assist for medications management;Direct supervision/assist for financial management;Assist for transportation;Help with stairs or ramp for entrance;Supervision due to cognitive status   Can travel by private vehicle   No    Equipment Recommendations Wheelchair (measurements PT);Wheelchair cushion (measurements PT);Hoyer lift;Hospital bed  Recommendations for Other Services       Functional Status Assessment Patient has had a recent decline in their functional status and demonstrates the ability to make significant improvements in function in a reasonable and predictable amount of time.     Precautions /  Restrictions Precautions Precautions: Fall Recall of Precautions/Restrictions: Impaired Precaution/Restrictions Comments: abdominal precautions, JP drain RLQ Restrictions Weight Bearing Restrictions Per Provider Order: No      Mobility  Bed Mobility Overal bed mobility: Needs Assistance Bed Mobility: Rolling, Sidelying to Sit, Sit to Supine Rolling: Total assist Sidelying to sit: Total assist   Sit to supine: Max assist   General bed mobility comments: totalA to come to sitting EOB due to pt arousal, pt did seem to initiate return to supine with head/trunk movement but needed assist to bring LE into bed    Transfers                   General transfer comment: deferred due to poor arousal       Balance Overall balance assessment: Needs assistance Sitting-balance support: Feet supported Sitting balance-Leahy Scale: Fair Sitting balance - Comments: initially min-modA progressed to momements of CGA, pt maintained eyes closed despite cues                                     Pertinent Vitals/Pain Pain Assessment Pain Assessment: No/denies pain    Home Living Family/patient expects to be discharged to:: Private residence Living Arrangements: Spouse/significant other Available Help at Discharge: Available 24 hours/day Type of Home: House Home Access: Stairs to enter Entrance Stairs-Rails: None Entrance Stairs-Number of Steps: 2   Home Layout: One level (step to [employer]) Home Equipment: None Additional Comments: pt takes tub baths    Prior Function Prior Level of Function : Independent/Modified Independent             Mobility Comments: no falls or  use of DME ADLs Comments: takes tub baths, husband does cooking and driving     Extremity/Trunk Assessment   Upper Extremity Assessment Upper Extremity Assessment: Overall WFL for tasks assessed    Lower Extremity Assessment Lower Extremity Assessment: Generalized weakness (limited movement to  command, ROM WFL)    Cervical / Trunk Assessment Cervical / Trunk Assessment: Kyphotic  Communication   Communication Communication: Impaired Factors Affecting Communication:  (lethargic)    Cognition Arousal: Obtunded, Suspect due to medications Behavior During Therapy: Flat affect   PT - Cognitive impairments: Difficult to assess Difficult to assess due to: Level of arousal                     PT - Cognition Comments: pt answered her name, when asked her husband's name she gave her son's name. otherwise pt not responding to questions or commands due to low arousal. Was given Haldol earlier this morning. Following commands: Impaired Following commands impaired: Follows one step commands inconsistently, Follows one step commands with increased time     Cueing Cueing Techniques: Verbal cues, Tactile cues     General Comments General comments (skin integrity, edema, etc.): VSS on RA, spouse present and supportive        Assessment/Plan    PT Assessment Patient needs continued PT services  PT Problem List Decreased strength;Decreased balance;Decreased activity tolerance;Decreased mobility;Decreased cognition;Decreased safety awareness       PT Treatment Interventions DME instruction;Gait training;Stair training;Functional mobility training;Therapeutic activities;Therapeutic exercise;Balance training;Patient/family education    PT Goals (Current goals can be found in the Care Plan section)  Acute Rehab PT Goals Patient Stated Goal: to be able to be home alone while spouse at work - per spouse PT Goal Formulation: With patient/family Time For Goal Achievement: 10/15/23 Potential to Achieve Goals: Fair    Frequency Min 1X/week        AM-PAC PT "6 Clicks" Mobility  Outcome Measure Help needed turning from your back to your side while in a flat bed without using bedrails?: Total Help needed moving from lying on your back to sitting on the side of a flat bed  without using bedrails?: Total Help needed moving to and from a bed to a chair (including a wheelchair)?: Total Help needed standing up from a chair using your arms (e.g., wheelchair or bedside chair)?: Total Help needed to walk in hospital room?: Total Help needed climbing 3-5 steps with a railing? : Total 6 Click Score: 6    End of Session   Activity Tolerance: Patient limited by lethargy Patient left: in bed;with call bell/phone within reach;with bed alarm set;with nursing/sitter in room;with family/visitor present;with restraints reapplied Nurse Communication: Mobility status PT Visit Diagnosis: Unsteadiness on feet (R26.81);Other abnormalities of gait and mobility (R26.89)    Time: 2130-8657 PT Time Calculation (min) (ACUTE ONLY): 30 min   Charges:   PT Evaluation $PT Eval Moderate Complexity: 1 Mod PT Treatments $Therapeutic Activity: 8-22 mins PT General Charges $$ ACUTE PT VISIT: 1 Visit         Vickki Muff, PT, DPT   Acute Rehabilitation Department Office [phone] Secure Chat Communication Preferred  Ronnie Derby 10/01/2023, 11:58 AM

## 2023-10-01 NOTE — Progress Notes (Addendum)
 PHARMACY - TOTAL PARENTERAL NUTRITION CONSULT NOTE   Indication:  anticipated prolonged NPO following duodenal ulcer repair   Patient Measurements: Height: 5\' 3"  (160 cm) Weight: 61.6 kg (135 lb 12.9 oz) IBW/kg (Calculated) : 52.4 TPN AdjBW (KG): 59 Body mass index is 24.06 kg/m. Usual Weight: 59kg on admission   Assessment:  Patient admitted with chief compliant of abdominal pain. CT abdomen suggests perforated pyloric channel ulcer but also could be a colon pathology due to recent constipation. Patient underwent ex-lap on 3/5 which should perforated duodenal ulcer with pneumoperitoneum and purulent peritonitis. Ulcer was then patched and patient was closed. Anticipate patient to be NPO for at least 5 days until able to undergo an UGI to ensure duodenal repair was successful. Pharmacy consulted to start TPN.   Patient currently intubated and unable to be interviewed for prior to admission nutritional information   Glucose / Insulin: BG<180, no hx of DM, no recent A1C  Electrolytes: K: 3.4, mag: 2.0, Phos 2.1, HCO3: 23, chloride: 108, CoCa 8.9, all others WNL  Renal: Scr/BUN WNL  Hepatic: T-bili 0.6, AST/ALT WNL, albumin: 2.8, alk phos WNL, TG: 56  Intake / Output; MIVF: UOP 0.8 ml/kg/hr, 50mL drain, net +2.9L, LR 31mL/hr  GI Imaging: 3/4 CT abdomen: moderate free fluid in abdomen consistent with perforation, potential duodenal ulcer  GI Surgeries / Procedures:  3/5: ex-lap and duodenal ulcer repair   Central access: PICC to be placed 3/5  TPN start date: 3/5   Nutritional Goals: Goal TPN rate is 60 mL/hr (provides 70 g of protein and 1500 kcals per day, meeting 100% of nutritional needs)  RD Assessment: 1500-1700 kcal  70-90 grams protein  Estimated Needs Total Energy Estimated Needs: 1500-1700 kcal Total Protein Estimated Needs: 70-90 gm Total Fluid Estimated Needs: > 1.5L/day  Current Nutrition:  NPO and TPN  Plan:  Advance TPN to 22ml/hr to provide 100% of  nutritional needs Electrolytes in TPN: Na 25 mEq/L, K 35 mEq/L, Ca 5 mEq/L, Mg 3 mEq/L, and Phos 15 mmol/L. Cl:Ac 1:2  Kphos IV x1 outside of TPN this AM (3/6) KCL IV x2 Continue standard MVI and trace elements to TPN Initiate Sensitive q4h SSI and adjust as needed  Discontinue cIVF at 1800  Monitor TPN labs on Mon/Thurs, daily until stabilization.   Calton Dach, PharmD, BCCCP Clinical Pharmacist 10/01/2023 7:32 AM

## 2023-10-01 NOTE — Progress Notes (Signed)
 Patient ID: AIKA BRZOSKA, female   DOB: 09-15-1945, 78 y.o.   MRN: [id number]    10/01/23 1600  Vitals  BP (!) 171/91  MAP (mmHg) 110  ECG Heart Rate (!) 118  Resp (!) 23    Blood pressure and heart rate increasing gradually over the day. Heart rate reaching to 130s. PA paged. En route and at bedside. Orders received, see MAR.  Lidia Collum, RN

## 2023-10-01 NOTE — Progress Notes (Signed)
 1 Day Post-Op  Subjective: CC: Pulled out NGT overnight. Her husband is at bedside. Reports she has some nausea and abdominal pain. No flatus or bm. Reports she takes meloxicam daily.   Objective: Vital signs in last 24 hours: Temp:  [97.7 F (36.5 C)-101 F (38.3 C)] 98 F (36.7 C) (03/06 0800) Pulse Rate:  [76-118] 93 (03/06 0800) Resp:  [16-26] 20 (03/06 0800) BP: (123-180)/(61-83) 170/81 (03/06 0800) SpO2:  [93 %-100 %] 99 % (03/06 0800) Arterial Line BP: (115-167)/(57-72) 140/64 (03/05 1800) Weight:  [61.6 kg] 61.6 kg (03/06 0500) Last BM Date :  (PTA)  Intake/Output from previous day: 03/05 0701 - 03/06 0700 In: 1601.7 [I.V.:1129.1; IV Piggyback:442.6] Out: 1530 [Urine:1220; Emesis/NG output:260; Drains:50] Intake/Output this shift: No intake/output data recorded.  PE: Gen:  Alert, NAD, pleasant Card:  Reg Pulm:  Rate and effort normal Abd: Soft, mild distension, generalized ttp without rigidity or guarding. Incisions, cdi. JP SS Ext:  No LE edema  Neuro: Opens eyes to voice, mae's to command  Lab Results:  Recent Labs    09/30/23 0541 10/01/23 0535  WBC 8.7 13.2*  HGB 11.4* 12.1  HCT 34.1* 36.6  PLT 257 267   BMET Recent Labs    09/30/23 0330 09/30/23 0345 10/01/23 0535  NA 138 136 136  K 3.8 3.8 3.4*  CL 107  --  108  CO2 22  --  23  GLUCOSE 164*  --  137*  BUN 18  --  11  CREATININE 0.72  --  0.76  CALCIUM 8.7*  --  7.9*   PT/INR No results for input(s): "LABPROT", "INR" in the last 72 hours. CMP     Component Value Date/Time   NA 136 10/01/2023 0535   NA 142 06/02/2022 1351   NA 143 05/30/2013 1040   K 3.4 (L) 10/01/2023 0535   K 4.6 05/30/2013 1040   CL 108 10/01/2023 0535   CL 105 05/05/2012 1058   CO2 23 10/01/2023 0535   CO2 27 05/30/2013 1040   GLUCOSE 137 (H) 10/01/2023 0535   GLUCOSE 98 05/30/2013 1040   GLUCOSE 93 05/05/2012 1058   BUN 11 10/01/2023 0535   BUN 9 06/02/2022 1351   BUN 14.4 05/30/2013 1040    CREATININE 0.76 10/01/2023 0535   CREATININE 1.1 05/30/2013 1040   CALCIUM 7.9 (L) 10/01/2023 0535   CALCIUM 10.1 05/30/2013 1040   PROT 5.6 (L) 10/01/2023 0535   PROT 7.1 06/02/2022 1351   PROT 7.7 05/30/2013 1040   ALBUMIN 2.8 (L) 10/01/2023 0535   ALBUMIN 4.8 06/02/2022 1351   ALBUMIN 3.8 05/30/2013 1040   AST 16 10/01/2023 0535   AST 26 05/30/2013 1040   ALT 18 10/01/2023 0535   ALT 22 05/30/2013 1040   ALKPHOS 64 10/01/2023 0535   ALKPHOS 88 05/30/2013 1040   BILITOT 0.5 10/01/2023 0535   BILITOT 0.5 06/02/2022 1351   BILITOT 0.59 05/30/2013 1040   GFRNONAA >60 10/01/2023 0535   GFRAA 72 (L) 11/15/2013 0630   Lipase     Component Value Date/Time   LIPASE 20 09/29/2023 1751    Studies/Results: DG CHEST PORT 1 VIEW Result Date: 09/30/2023 CLINICAL DATA:  Shortness of breath.  Recent extubation. EXAM: PORTABLE CHEST 1 VIEW COMPARISON:  Prior today FINDINGS: Endotracheal tube is been removed since prior exam. Enteric tube is seen entering the stomach. New right arm PICC line is seen with tip overlying the superior cavoatrial junction. Low lung volumes are seen. Mild  bibasilar atelectasis noted. No evidence of pulmonary consolidation. Stable heart size and mediastinal contours. IMPRESSION: Low lung volumes and mild bibasilar atelectasis. New right arm PICC line in appropriate position. Electronically Signed   By: Danae Orleans M.D.   On: 09/30/2023 18:04   DG Abd Portable 1V Result Date: 09/30/2023 CLINICAL DATA:  Check gastric catheter placement EXAM: PORTABLE ABDOMEN - 1 VIEW COMPARISON:  None Available. FINDINGS: Gastric catheter is noted extending into the distal stomach. Surgical drain is noted. Scattered bowel gas is seen. Degenerative changes of the thoracolumbar spine are noted. IMPRESSION: Gastric catheter within the distal stomach. Electronically Signed   By: Alcide Clever M.D.   On: 09/30/2023 03:32   DG CHEST PORT 1 VIEW Result Date: 09/30/2023 CLINICAL DATA:  [id number].  NGT  positioning film. [id number].  Encounter for intubation. EXAM: PORTABLE CHEST 1 VIEW COMPARISON:  AP Lat chest yesterday at 6:12 p.m. FINDINGS: 3:11 a.m. ETT interval insertion with tip 3.7 cm from the carina. NGT tip is just inside the stomach and needs to be advanced further in 12-15 cm. There is a low inspiration on exam. This could obscure airspace disease in the bases. There is linear atelectasis in the hypoinflated lung bases. Above the diaphragm the visualized lungs are clear. Heart size and vasculature are normal. Stable mediastinum with aortic tortuosity and atherosclerosis. Small hiatal hernia. No new osseous findings. Mild S shaped thoracic scoliosis again noted. Left mastectomy and axillary surgical clips are also redemonstrated. This also was seen on 08/05/2012 PA and lateral study. IMPRESSION: 1. ETT tip 3.7 cm from the carina. 2. NGT tip is just inside the stomach and needs to be advanced further in 12-15 cm. 3. Low inspiration with linear atelectasis in the hypoinflated lung bases. 4. Aortic atherosclerosis. 5. Small hiatal hernia. Electronically Signed   By: Almira Bar M.D.   On: 09/30/2023 03:30   [place] EKG SITE RITE Result Date: 09/30/2023 If Site Rite image not attached, placement could not be confirmed due to current cardiac rhythm.  CT ABDOMEN PELVIS W CONTRAST Result Date: 09/29/2023 CLINICAL DATA:  Abdomen pain chest pain nausea vomiting EXAM: CT ABDOMEN AND PELVIS WITH CONTRAST TECHNIQUE: Multidetector CT imaging of the abdomen and pelvis was performed using the standard protocol following bolus administration of intravenous contrast. RADIATION DOSE REDUCTION: This exam was performed according to the departmental dose-optimization program which includes automated exposure control, adjustment of the mA and/or kV according to patient size and/or use of iterative reconstruction technique. CONTRAST:  80mL OMNIPAQUE IOHEXOL 300 MG/ML  SOLN COMPARISON:  CT 03/07/2022 FINDINGS: Lower chest: Lung  bases demonstrate no acute airspace disease. Limited by patient habitus an patient positioning, patient was imaged in the right lateral decubitus position. Moderate hiatal hernia Hepatobiliary: No calcified gallstone or biliary dilatation. Subcentimeter hypodensity in the right hepatic lobe too small to further characterize Pancreas: Unremarkable. No pancreatic ductal dilatation or surrounding inflammatory changes. Spleen: Normal in size without focal abnormality. Adrenals/Urinary Tract: Adrenal glands are normal. Kidneys show no hydronephrosis. The bladder is slightly thick walled Stomach/Bowel: Stomach nondistended. Wall thickening of the pylorus and duodenal bulb. Small gas collection tracking to the porta hepatis from the duodenal bulb region, coronal series 6, image 66. Wall thickening of the second portion of duodenum, coronal series 6, image 56. Remainder of the small bowel is unremarkable. Large stool burden in the cecum. Nonvisualized appendix Vascular/Lymphatic: No significant vascular findings are present. No enlarged abdominal or pelvic lymph nodes. Reproductive: Uterus and bilateral adnexa are unremarkable. Other:  Moderate free air mostly in the upper abdomen with some free air superficial to the left colon. Small volume free fluid in the right upper quadrant. Musculoskeletal: Scoliosis and degenerative changes. No acute osseous abnormality IMPRESSION: 1. Moderate free air within the abdomen consistent with hollow viscus perforation. Small volume free fluid in the right upper quadrant. Thickened appearance of pylorus and duodenal bulb with small gas tracking to the porta hepatis, suspect that findings are secondary to perforated gastric or duodenal ulcer. Wall thickening of the second portion of duodenum is likely reactive. 2. Moderate hiatal hernia Critical Value/emergent results were called by telephone at the time of interpretation on 09/29/2023 at 9:14 pm to provider [facility] , who verbally  acknowledged these results. Electronically Signed   By: Jasmine Pang M.D.   On: 09/29/2023 21:14   DG Chest 2 View Result Date: 09/29/2023 CLINICAL DATA:  Upper chest pain EXAM: CHEST - 2 VIEW COMPARISON:  Chest radiograph dated 08/05/2012 FINDINGS: Low lung volumes with bronchovascular crowding. No focal consolidations. No pleural effusion or pneumothorax. The heart size and mediastinal contours are within normal limits. No acute osseous abnormality. Left axillary surgical clips. IMPRESSION: Low lung volumes with bronchovascular crowding. No focal consolidations. Electronically Signed   By: Agustin Cree M.D.   On: 09/29/2023 20:17    Anti-infectives: Anti-infectives (From admission, onward)    Start     Dose/Rate Route Frequency Ordered Stop   10/01/23 0400  fluconazole (DIFLUCAN) IVPB 400 mg        400 mg 100 mL/hr over 120 Minutes Intravenous Every 24 hours 09/30/23 0303     09/30/23 0600  piperacillin-tazobactam (ZOSYN) IVPB 3.375 g        3.375 g 12.5 mL/hr over 240 Minutes Intravenous Every 8 hours 09/30/23 0246 10/07/23 0559   09/30/23 0400  fluconazole (DIFLUCAN) IVPB 400 mg        400 mg 100 mL/hr over 120 Minutes Intravenous Every 2 hours 09/30/23 0303 09/30/23 0754   09/29/23 2134  ceFEPIme (MAXIPIME) 1 g injection       Note to Pharmacy: Schuyler Amor: cabinet override      09/29/23 2134 09/30/23 0944   09/29/23 2130  ceFEPIme (MAXIPIME) 1 g in sodium chloride 0.9 % 100 mL IVPB        1 g 200 mL/hr over 30 Minutes Intravenous  Once 09/29/23 2119 09/29/23 2218   09/29/23 2130  metroNIDAZOLE (FLAGYL) IVPB 500 mg        500 mg 100 mL/hr over 60 Minutes Intravenous  Once 09/29/23 2119 09/29/23 2315        Assessment/Plan POD 1 s/p Laparoscopic graham patch of perforated duodenal bulb ulcer by Dr. Dossie Der on 09/30/23 - NGT pulled out overnight. Leave out for now. Strict NPO. Plan UGI tomorrow - Cont PICC/TPN - Cont IV Zosyn/Fluconazole - Cont JP drain, currently SS - Add  NSAIDs to allergy list. Check H. Pylori - D/c foley - Mobilize, PT - Delirium precautions, minimize narcotics - Consult TRH to assist with underlying medical conditions   FEN - NPO, TPN VTE - SCDs, ppx Lovenox  ID - Zosyn/Fluconazole Foley - In place, good uop at 0.8 ml/kg/hr. D/c today, TOV  Hx DVT, factor V Leiden - discussed w/ pharm. She was not on medication for this pre-op HLD   LOS: 1 day    Jacinto Halim, [facility] Surgery 10/01/2023, 10:29 AM Please see Amion for pager number during day hours 7:00am-4:30pm

## 2023-10-01 NOTE — Plan of Care (Signed)

## 2023-10-02 ENCOUNTER — Inpatient Hospital Stay (HOSPITAL_COMMUNITY)

## 2023-10-02 DIAGNOSIS — Z9889 Other specified postprocedural states: Secondary | ICD-10-CM | POA: Diagnosis not present

## 2023-10-02 DIAGNOSIS — Z86718 Personal history of other venous thrombosis and embolism: Secondary | ICD-10-CM

## 2023-10-02 DIAGNOSIS — F05 Delirium due to known physiological condition: Secondary | ICD-10-CM | POA: Insufficient documentation

## 2023-10-02 DIAGNOSIS — F03918 Unspecified dementia, unspecified severity, with other behavioral disturbance: Secondary | ICD-10-CM | POA: Insufficient documentation

## 2023-10-02 LAB — GLUCOSE, CAPILLARY
Glucose-Capillary: 125 mg/dL — ABNORMAL HIGH (ref 70–99)
Glucose-Capillary: 142 mg/dL — ABNORMAL HIGH (ref 70–99)
Glucose-Capillary: 124 mg/dL — ABNORMAL HIGH (ref 70–99)
Glucose-Capillary: 126 mg/dL — ABNORMAL HIGH (ref 70–99)
Glucose-Capillary: 94 mg/dL (ref 70–99)
Glucose-Capillary: 92 mg/dL (ref 70–99)

## 2023-10-02 LAB — CBC
HCT: 37.2 % (ref 36.0–46.0)
Hemoglobin: 12.7 g/dL (ref 12.0–15.0)
MCH: 30.3 pg (ref 26.0–34.0)
MCHC: 34.1 g/dL (ref 30.0–36.0)
MCV: 88.8 fL (ref 80.0–100.0)
Platelets: 292 10*3/uL (ref 150–400)
RBC: 4.19 MIL/uL (ref 3.87–5.11)
RDW: 13.8 % (ref 11.5–15.5)
WBC: 17 10*3/uL — ABNORMAL HIGH (ref 4.0–10.5)
nRBC: 0 % (ref 0.0–0.2)

## 2023-10-02 LAB — BASIC METABOLIC PANEL
Anion gap: 8 (ref 5–15)
BUN: 11 mg/dL (ref 8–23)
CO2: 25 mmol/L (ref 22–32)
Calcium: 8.7 mg/dL — ABNORMAL LOW (ref 8.9–10.3)
Chloride: 103 mmol/L (ref 98–111)
Creatinine, Ser: 0.73 mg/dL (ref 0.44–1.00)
GFR, Estimated: >60 mL/min (ref >60)
Glucose, Bld: 131 mg/dL — ABNORMAL HIGH (ref 70–99)
Potassium: 3.3 mmol/L — ABNORMAL LOW (ref 3.5–5.1)
Sodium: 136 mmol/L (ref 135–145)

## 2023-10-02 LAB — MAGNESIUM: Magnesium: 2 mg/dL (ref 1.7–2.4)

## 2023-10-02 LAB — HEMOGLOBIN A1C
Hgb A1c MFr Bld: 5.6 % (ref 4.8–5.6)
Mean Plasma Glucose: 114.02 mg/dL

## 2023-10-02 MED ORDER — INSULIN ASPART 100 UNIT/ML IJ SOLN
0.0000 [IU] | Freq: Four times a day (QID) | INTRAMUSCULAR | Status: DC
  Administered 2023-10-02: 1 [IU] via SUBCUTANEOUS

## 2023-10-02 MED ORDER — IOHEXOL 300 MG/ML  SOLN
150.0000 mL | Freq: Once | INTRAMUSCULAR | Status: AC | PRN
  Administered 2023-10-02: 150 mL via ORAL

## 2023-10-02 MED ORDER — ACETAMINOPHEN 500 MG PO TABS
1000.0000 mg | ORAL_TABLET | Freq: Four times a day (QID) | ORAL | Status: DC
  Administered 2023-10-02 – 2023-10-07 (×17): 1000 mg via ORAL
  Filled 2023-10-02 (×18): qty 2

## 2023-10-02 MED ORDER — METHOCARBAMOL 500 MG PO TABS
500.0000 mg | ORAL_TABLET | Freq: Four times a day (QID) | ORAL | Status: DC | PRN
  Administered 2023-10-03 – 2023-10-07 (×5): 500 mg via ORAL
  Filled 2023-10-02 (×5): qty 1

## 2023-10-02 MED ORDER — BOOST / RESOURCE BREEZE PO LIQD CUSTOM
1.0000 | Freq: Three times a day (TID) | ORAL | Status: DC
  Administered 2023-10-02 – 2023-10-05 (×8): 1 via ORAL

## 2023-10-02 MED ORDER — INSULIN ASPART 100 UNIT/ML IJ SOLN: 0.0000 [IU] | Freq: Every day | INTRAMUSCULAR | Status: DC

## 2023-10-02 MED ORDER — INSULIN ASPART 100 UNIT/ML IJ SOLN
0.0000 [IU] | Freq: Three times a day (TID) | INTRAMUSCULAR | Status: DC
  Administered 2023-10-02 – 2023-10-04 (×5): 1 [IU] via SUBCUTANEOUS

## 2023-10-02 MED ORDER — POTASSIUM CHLORIDE 10 MEQ/100ML IV SOLN: 10.0000 meq | INTRAVENOUS | Status: DC

## 2023-10-02 MED ORDER — POTASSIUM CHLORIDE 10 MEQ/50ML IV SOLN
10.0000 meq | INTRAVENOUS | Status: AC
  Administered 2023-10-02 (×4): 10 meq via INTRAVENOUS
  Filled 2023-10-02 (×4): qty 50

## 2023-10-02 MED ORDER — TRAVASOL 10 % IV SOLN
INTRAVENOUS | Status: AC
  Filled 2023-10-02: qty 700.1

## 2023-10-02 MED ORDER — HYDROMORPHONE HCL 1 MG/ML IJ SOLN
0.5000 mg | INTRAMUSCULAR | Status: DC | PRN
  Administered 2023-10-02: 0.5 mg via INTRAVENOUS
  Filled 2023-10-02: qty 0.5

## 2023-10-02 MED ORDER — OXYCODONE HCL 5 MG PO TABS
2.5000 mg | ORAL_TABLET | ORAL | Status: DC | PRN
  Administered 2023-10-03 – 2023-10-05 (×3): 5 mg via ORAL
  Administered 2023-10-05: 2.5 mg via ORAL
  Administered 2023-10-06: 5 mg via ORAL
  Filled 2023-10-02 (×5): qty 1

## 2023-10-02 MED ORDER — CHLORHEXIDINE GLUCONATE CLOTH 2 % EX PADS
6.0000 | MEDICATED_PAD | Freq: Every day | CUTANEOUS | Status: DC
  Administered 2023-10-02 – 2023-10-07 (×6): 6 via TOPICAL

## 2023-10-02 MED ORDER — MELATONIN 3 MG PO TABS
3.0000 mg | ORAL_TABLET | Freq: Every day | ORAL | Status: DC
  Administered 2023-10-02 – 2023-10-06 (×5): 3 mg via ORAL
  Filled 2023-10-02 (×5): qty 1

## 2023-10-02 NOTE — Plan of Care (Signed)

## 2023-10-02 NOTE — Care Management Important Message (Signed)
 Important Message  Patient Details  Name: Samantha Ali MRN: [id number] Date of Birth: [birth year]   Important Message Given:  Yes - Medicare IM     Sherilyn Banker 10/02/2023, 12:24 PM

## 2023-10-02 NOTE — Progress Notes (Signed)
 PHARMACY - TOTAL PARENTERAL NUTRITION CONSULT NOTE   Indication:  anticipated prolonged NPO following duodenal ulcer repair   Patient Measurements: Height: 5\' 3"  (160 cm) Weight: 60.3 kg (132 lb 15 oz) IBW/kg (Calculated) : 52.4 TPN AdjBW (KG): 59 Body mass index is 23.55 kg/m. Usual Weight: 59kg on admission   Assessment:  Patient admitted with chief compliant of abdominal pain. CT abdomen suggests perforated pyloric channel ulcer but also could be a colon pathology due to recent constipation. Patient underwent ex-lap on 3/5 which should perforated duodenal ulcer with pneumoperitoneum and purulent peritonitis. Ulcer was then patched and patient was closed. Anticipate patient to be NPO for at least 5 days until able to undergo an UGI to ensure duodenal repair was successful. Pharmacy consulted to start TPN.   Patient previously intubated and was unable to be interviewed for prior to admission nutritional information. NGT pulled out by patient 3/06 AM, surgery to leave out for now. Patient to remain NPO for UGI procedure 3/07.   Glucose / Insulin: no hx of DM, no recent A1C, BG 125-142, used 6 units insulin/24 hr Electrolytes: K: 3.6, Mg: 2.0, Phos 2.1, HCO3: 25, Cl: 103, CoCa 9.7, all others WNL  Renal: Scr/BUN WNL  Hepatic: T-bili 0.6, AST/ALT WNL, albumin: 2.8, alk phos WNL, TG: 56  Intake / Output; MIVF: UOP 0.8 ml/kg/hr, 50mL drain, net +2.9L, LR 61mL/hr   GI Imaging: 3/04 CT abdomen: moderate free fluid in abdomen consistent with perforation, potential duodenal ulcer  GI Surgeries / Procedures:  3/05: ex-lap and duodenal ulcer repair   Central access: PICC to be placed 3/05  TPN start date: 3/05   Nutritional Goals: Goal TPN rate is 60 mL/hr (provides 70 g of protein and 1500 kcals per day, meeting 100% of nutritional needs)  RD Assessment: 1500-1700 kcal  70-90 grams protein  Estimated Needs Total Energy Estimated Needs: 1500-1700 kcal Total Protein Estimated Needs:  70-90 gm Total Fluid Estimated Needs: > 1.5L/day  Current Nutrition:  NPO and TPN  Plan:  Continue TPN at 60 mL/hr to provide 100% of nutritional needs Electrolytes in TPN: Na 25 mEq/L, K to 40 mEq/L, Ca 5 mEq/L, Mg 3 mEq/L, and Phos 15 mmol/L. Cl:Ac 1:2  IV Kcl 40 mEq outside of TPN Continue standard MVI and trace elements to TPN Adjust Sensitive q6h SSI and adjust as needed  Monitor TPN labs on Mon/Thurs, daily until stabilization  Thank you for allowing pharmacy to be a part of this patient's care.  Thelma Barge, PharmD, BCPS Clinical Pharmacist

## 2023-10-02 NOTE — Progress Notes (Addendum)
 PROGRESS NOTE    Samantha Ali  FAO:[id number] DOB: 07/30/1945 DOA: 09/29/2023 PCP: Sigmund Hazel, MD    Brief Narrative:  Samantha Ali is a 78 y.o. female with past medical history of hyperlipidemia, CKD, history of DVT, factor V Leiden mutation, history of breast cancer, and osteoporosis initially presented to the hospital with acute on abdomen on 09/29/2023.  CT scan done showed perforated duodenal versus gastric ulcer and patient underwent emergent diagnostic laparotomy on 3 5.  There was note of perforation in the anterior portion of duodenal bulb with purulent peritonitis, status post laparoscopic repair and JP drain was placed and patient was transferred to the ICU l intubated.  Patient was started on empiric antibiotics of Zosyn and fluconazole.  Subsequently patient was transferred out of the ICU.  Patient was noted to be agitated and had pulled out NG tube and was noted to have fever tachycardia, tachypnea and elevated blood pressure so medical team was consulted for further evaluation and treatment.     Assessment and Plan: Sepsis secondary to duodenal bulb perforation s/p laparoscopic repair Peritonitis secondary to perforation  Status post laparoscopic Cheree Ditto patch of perforated duodenal bulb ulcer by Dr. Dossie Der on 09/30/23.  Blood cultures negative in 1 day. continue IV Zosyn and fluconazole.  Leukocytosis slightly trended up to 17K from 13.2.  Will continue to monitor closely.  Has been started on TPN.  Plans for upper GI study 3/7.  Continue to follow blood cultures, n.p.o., empiric antibiotic.  Acute hypoxic respiratory failure Resolved.  On room air.  Initially was intubated in the ICU.  Delirium Likely hospital induced delirium in the background of mild to moderate dementia as per neurology in the past.   Treated with a rivastigmine patch as unable to tolerate memantine and donepezil previously.  Continue patch soft restraints delirium precautions, on mittens at this time.    Elevated blood pressure. Not on antihypertensives as outpatient.  Currently on as needed metoprolol.   History of recurrent DVT Heterozygote factor 5 Leiden mutation Patient with prior history of DVT  in the 1990s and subsequent 07/2013 after knee replacement was thought to be provoked.  Records note patient was about to complete her course for Xarelto when found to have extension of prior DVT requiring hospitalization 10/2013.  Questioned possible failure of Xarelto.  She was switched to Coumadin which she continued on Xarelto until 06/2014 when it was discontinued by her primary. Spoke  with son and unsure about the need for it, couldn't reach the husband.  On preventive lovenox now. Will discuss further in am.    Hypokalemia Mild.  Will replace through IV.  Check BMP in AM.   Acute kidney injury Initial creatinine up to 1.2.  Creatinine today at this 0.7.  Improved with IV fluids.        DVT prophylaxis: enoxaparin (LOVENOX) injection 40 mg Start: 09/30/23 1000 SCDs Start: 09/30/23 0242   Code Status:     Code Status: Full Code  Disposition: Uncertain  Status is: Inpatient   Family Communication: None at bedside. Spoke son on the phone    Procedures:   Status post laparoscopic Cheree Ditto patch of perforated duodenal bulb ulcer by Dr. Dossie Der on 09/30/23.  Antimicrobials:  Fluconazole and Zosyn.  Anti-infectives (From admission, onward)    Start     Dose/Rate Route Frequency Ordered Stop   10/01/23 0400  fluconazole (DIFLUCAN) IVPB 400 mg        400 mg 100 mL/hr over 120 Minutes  Intravenous Every 24 hours 09/30/23 0303     09/30/23 0600  piperacillin-tazobactam (ZOSYN) IVPB 3.375 g        3.375 g 12.5 mL/hr over 240 Minutes Intravenous Every 8 hours 09/30/23 0246 10/07/23 0559   09/30/23 0400  fluconazole (DIFLUCAN) IVPB 400 mg        400 mg 100 mL/hr over 120 Minutes Intravenous Every 2 hours 09/30/23 0303 09/30/23 0754   09/29/23 2134  ceFEPIme (MAXIPIME) 1 g  injection       Note to Pharmacy: Schuyler Amor: cabinet override      09/29/23 2134 09/30/23 0944   09/29/23 2130  ceFEPIme (MAXIPIME) 1 g in sodium chloride 0.9 % 100 mL IVPB        1 g 200 mL/hr over 30 Minutes Intravenous  Once 09/29/23 2119 09/29/23 2218   09/29/23 2130  metroNIDAZOLE (FLAGYL) IVPB 500 mg        500 mg 100 mL/hr over 60 Minutes Intravenous  Once 09/29/23 2119 09/29/23 2315        Subjective: Today, patient was seen and examined at bedside.   Communicated with confusion.  Disoriented.  Slightly agitated on mittens.  Denies any pain, nausea or vomiting.  Objective: Vitals:   10/01/23 2321 10/02/23 0315 10/02/23 0500 10/02/23 0815  BP: (!) 179/94 (!) 159/91  (!) 155/89  Pulse: 99 97  98  Resp: 20 20  18   Temp: 98.3 F (36.8 C) 98.4 F (36.9 C)  98.6 F (37 C)  TempSrc: Oral Oral  Oral  SpO2: 99% 100%  92%  Weight:   60.3 kg   Height:        Intake/Output Summary (Last 24 hours) at 10/02/2023 0913 Last data filed at 10/02/2023 0359 Gross per 24 hour  Intake 196.03 ml  Output 3700 ml  Net -3503.97 ml   Filed Weights   09/29/23 2255 10/01/23 0500 10/02/23 0500  Weight: 59 kg 61.6 kg 60.3 kg    Physical Examination: Body mass index is 23.55 kg/m.   General:  Average built, not in obvious distress, Communicative, disoriented and confused, on mittens HENT:   No scleral pallor or icterus noted. Oral mucosa is moist.  Chest:    Diminished breath sounds bilaterally. No crackles or wheezes.  CVS: S1 &S2 heard. No murmur.  Regular rate and rhythm. Abdomen: Soft, JP drain in place.  Mild tenderness on palpation.   Extremities: No cyanosis, clubbing or edema.  Peripheral pulses are palpable.  Right upper extremity PICC line in place. Psych: Alert, awake and Communicative, disoriented  CNS:  No cranial nerve deficits.  Moving all extremities. Skin: Warm and dry.  No rashes noted.  Data Reviewed:   CBC: Recent Labs  Lab 09/29/23 1751 09/30/23 0330  09/30/23 0345 09/30/23 0541 10/01/23 0535 10/02/23 0503  WBC 9.1 8.4  --  8.7 13.2* 17.0*  HGB 14.2 12.0 11.6* 11.4* 12.1 12.7  HCT 42.4 35.6* 34.0* 34.1* 36.6 37.2  MCV 91.2 91.8  --  91.2 90.8 88.8  PLT 338 247  --  257 267 292    Basic Metabolic Panel: Recent Labs  Lab 09/29/23 1751 09/30/23 0330 09/30/23 0345 10/01/23 0535 10/02/23 0503  NA 135 138 136 136 136  K 3.7 3.8 3.8 3.4* 3.3*  CL 98 107  --  108 103  CO2 25 22  --  23 25  GLUCOSE 126* 164*  --  137* 131*  BUN 33* 18  --  11 11  CREATININE 1.20*  0.72  --  0.76 0.73  CALCIUM 10.1 8.7*  --  7.9* 8.7*  MG  --  1.7  --  2.0 2.0  PHOS  --  3.9  --  2.1*  --     Liver Function Tests: Recent Labs  Lab 09/29/23 1751 10/01/23 0535  AST 11* 16  ALT 10 18  ALKPHOS 101 64  BILITOT 0.6 0.5  PROT 7.3 5.6*  ALBUMIN 4.6 2.8*     Radiology Studies: DG CHEST PORT 1 VIEW Result Date: 09/30/2023 CLINICAL DATA:  Shortness of breath.  Recent extubation. EXAM: PORTABLE CHEST 1 VIEW COMPARISON:  Prior today FINDINGS: Endotracheal tube is been removed since prior exam. Enteric tube is seen entering the stomach. New right arm PICC line is seen with tip overlying the superior cavoatrial junction. Low lung volumes are seen. Mild bibasilar atelectasis noted. No evidence of pulmonary consolidation. Stable heart size and mediastinal contours. IMPRESSION: Low lung volumes and mild bibasilar atelectasis. New right arm PICC line in appropriate position. Electronically Signed   By: Danae Orleans M.D.   On: 09/30/2023 18:04      LOS: 2 days    Joycelyn Das, MD [facility] Available via Epic secure chat 7am-7pm After these hours, please refer to coverage provider listed on [email or web address] 10/02/2023, 9:13 AM

## 2023-10-02 NOTE — TOC Progression Note (Signed)
 Transition of Care [facility]) - Progression Note    Patient Details  Name: Samantha Ali MRN: [id number] Date of Birth: [birth year]  [facility] CM/SW Contact  Lorri Frederick, LCSW Phone Number: 10/02/2023, 2:15 PM  Clinical Narrative:   Bed offers placed in pt room on medicare choice document.  CSW spoke to husband by phone.  He is stating that he wants his wife to be more able to participate in discussion about DC plan and she is not able right now.  He will look at the list when he comes to the hospital.     Expected Discharge Plan: Skilled Nursing Facility Barriers to Discharge: Continued Medical Work up  Expected Discharge Plan and Services       Living arrangements for the past 2 months: Single Family Home                                       Social Determinants of Health (SDOH) Interventions SDOH Screenings   Food Insecurity: No Food Insecurity (09/30/2023)  Housing: Low Risk  (09/30/2023)  Transportation Needs: No Transportation Needs (09/30/2023)  Utilities: Not At Risk (09/30/2023)  Social Connections: Unknown (09/30/2023)  Tobacco Use: Low Risk  (09/28/2023)    Readmission Risk Interventions     No data to display

## 2023-10-02 NOTE — Hospital Course (Signed)
 Samantha Ali is a 78 y.o. female with past medical history of hyperlipidemia, CKD, history of DVT, factor V Leiden mutation, history of breast cancer, and osteoporosis initially presented to the hospital with acute on abdomen on 09/29/2023.  CT scan done showed perforated duodenal versus gastric ulcer and patient underwent emergent diagnostic laparotomy on 3 5.  There was note of perforation in the anterior portion of duodenal bulb with purulent peritonitis, status post laparoscopic repair and JP drain was placed and patient was transferred to the ICU l intubated.  Patient was started on empiric antibiotics of Zosyn and fluconazole.  Subsequently patient was transferred out of the ICU.  Patient was noted to be agitated and had pulled out NG tube and was noted to have fever tachycardia tachypnea and elevated blood pressure so medical team was consulted for further evaluation and treatment.    Sepsis secondary to duodenal bulb perforation s/p laparoscopic repair Peritonitis secondary to perforation  Status post laparoscopic Cheree Ditto patch of perforated duodenal bulb ulcer by Dr. Dossie Der on 09/30/23.  Blood cultures negative in 1 day. continue IV Zosyn and fluconazole.  Leukocytosis slightly trended up to 17K from 13.2.  Will continue to monitor closely.  Has been started on TPN.  Plans for upper GI study 3/7.  Continue to follow blood cultures, n.p.o., empiric antibiotic.  Acute hypoxic respiratory failure Resolved.  On room air.  Initially was intubated in the ICU with  Delirium Likely hospital induced delirium in the background of mild to moderate dementia as per neurology in the past.   Treated with a rivastigmine patch as unable to tolerate memantine and donepezil previously.  Continue patch soft restraints delirium precautions   Elevated blood pressure. Not on antihypertensives as outpatient.  Currently on as needed metoprolol.   History of recurrent DVT Heterozygote factor 5 Leiden  mutation Patient with prior history of DVT  in the 1990s and subsequent 07/2013 after knee replacement was thought to be provoked.  Records note patient was about to complete her course for Xarelto when found to have extension of prior DVT requiring hospitalization 10/2013.  Questioned possible failure of Xarelto.  She was switched to Coumadin which she continued on Xarelto until 06/2014 when it was discontinued by her primary.  Was supposed to follow-up with oncology as outpatient.  Given history of 3 DVTs in the past,  patient likely needs to be on lifelong anticoagulation.  Currently on Lovenox    Hypokalemia Mild.  Will replace through IV.   Acute kidney injury Initial creatinine up to 1.2.  Creatinine today at this 0.7.  Improved with IV fluids.

## 2023-10-02 NOTE — Progress Notes (Signed)
 2 Days Post-Op  Subjective: CC: More awake and alert this morning. A&O x 1. Reports no cp, sob, abdominal pain, n/v. No flatus or BM. Voiding since foley removal. Worked with PT yesterday.   Afebrile. Tachycardia improved to 98. Required 1 dose of IV metoprolol last night. Hypertensive to 155/89 this am. Received 2 doses of IV hydralazine yesterday in addition to 1 dose of IV metoprolol last night. On RA. WBC 17 (13.2). K 3.3. Mg 2. Cr wnl. Hgb stable.   Objective: Vital signs in last 24 hours: Temp:  [98.3 F (36.8 C)-99.4 F (37.4 C)] 98.6 F (37 C) (03/07 0815) Pulse Rate:  [91-110] 98 (03/07 0815) Resp:  [18-27] 18 (03/07 0815) BP: (155-187)/(78-94) 155/89 (03/07 0815) SpO2:  [92 %-100 %] 92 % (03/07 0815) Weight:  [60.3 kg] 60.3 kg (03/07 0500) Last BM Date :  (PTA)  Intake/Output from previous day: 03/06 0701 - 03/07 0700 In: 196 [IV Piggyback:196] Out: 3700 [Urine:3650; Drains:50] Intake/Output this shift: No intake/output data recorded.  PE: Gen:  Alert, NAD, pleasant Card:  Reg Pulm: CTA b/l. Rate and effort normal. On RA  Abd: Soft, mild distension, only ttp on the R side of her abdomen around the drain today. No rigidity or guarding and otherwise NT. Incisions, cdi. JP SS - 50cc/24 hours.  Ext:  No LE edema  Neuro: Pupils equal and round, EOMI, CN 3-12 grossly intact, mae's to command   Lab Results:  Recent Labs    10/01/23 0535 10/02/23 0503  WBC 13.2* 17.0*  HGB 12.1 12.7  HCT 36.6 37.2  PLT 267 292   BMET Recent Labs    10/01/23 0535 10/02/23 0503  NA 136 136  K 3.4* 3.3*  CL 108 103  CO2 23 25  GLUCOSE 137* 131*  BUN 11 11  CREATININE 0.76 0.73  CALCIUM 7.9* 8.7*   PT/INR No results for input(s): "LABPROT", "INR" in the last 72 hours. CMP     Component Value Date/Time   NA 136 10/02/2023 0503   NA 142 06/02/2022 1351   NA 143 05/30/2013 1040   K 3.3 (L) 10/02/2023 0503   K 4.6 05/30/2013 1040   CL 103 10/02/2023 0503   CL 105  05/05/2012 1058   CO2 25 10/02/2023 0503   CO2 27 05/30/2013 1040   GLUCOSE 131 (H) 10/02/2023 0503   GLUCOSE 98 05/30/2013 1040   GLUCOSE 93 05/05/2012 1058   BUN 11 10/02/2023 0503   BUN 9 06/02/2022 1351   BUN 14.4 05/30/2013 1040   CREATININE 0.73 10/02/2023 0503   CREATININE 1.1 05/30/2013 1040   CALCIUM 8.7 (L) 10/02/2023 0503   CALCIUM 10.1 05/30/2013 1040   PROT 5.6 (L) 10/01/2023 0535   PROT 7.1 06/02/2022 1351   PROT 7.7 05/30/2013 1040   ALBUMIN 2.8 (L) 10/01/2023 0535   ALBUMIN 4.8 06/02/2022 1351   ALBUMIN 3.8 05/30/2013 1040   AST 16 10/01/2023 0535   AST 26 05/30/2013 1040   ALT 18 10/01/2023 0535   ALT 22 05/30/2013 1040   ALKPHOS 64 10/01/2023 0535   ALKPHOS 88 05/30/2013 1040   BILITOT 0.5 10/01/2023 0535   BILITOT 0.5 06/02/2022 1351   BILITOT 0.59 05/30/2013 1040   GFRNONAA >60 10/02/2023 0503   GFRAA 72 (L) 11/15/2013 0630   Lipase     Component Value Date/Time   LIPASE 20 09/29/2023 1751    Studies/Results: DG CHEST PORT 1 VIEW Result Date: 09/30/2023 CLINICAL DATA:  Shortness of breath.  Recent extubation. EXAM: PORTABLE CHEST 1 VIEW COMPARISON:  Prior today FINDINGS: Endotracheal tube is been removed since prior exam. Enteric tube is seen entering the stomach. New right arm PICC line is seen with tip overlying the superior cavoatrial junction. Low lung volumes are seen. Mild bibasilar atelectasis noted. No evidence of pulmonary consolidation. Stable heart size and mediastinal contours. IMPRESSION: Low lung volumes and mild bibasilar atelectasis. New right arm PICC line in appropriate position. Electronically Signed   By: Danae Orleans M.D.   On: 09/30/2023 18:04    Anti-infectives: Anti-infectives (From admission, onward)    Start     Dose/Rate Route Frequency Ordered Stop   10/01/23 0400  fluconazole (DIFLUCAN) IVPB 400 mg        400 mg 100 mL/hr over 120 Minutes Intravenous Every 24 hours 09/30/23 0303     09/30/23 0600  piperacillin-tazobactam  (ZOSYN) IVPB 3.375 g        3.375 g 12.5 mL/hr over 240 Minutes Intravenous Every 8 hours 09/30/23 0246 10/07/23 0559   09/30/23 0400  fluconazole (DIFLUCAN) IVPB 400 mg        400 mg 100 mL/hr over 120 Minutes Intravenous Every 2 hours 09/30/23 0303 09/30/23 0754   09/29/23 2134  ceFEPIme (MAXIPIME) 1 g injection       Note to Pharmacy: Schuyler Amor: cabinet override      09/29/23 2134 09/30/23 0944   09/29/23 2130  ceFEPIme (MAXIPIME) 1 g in sodium chloride 0.9 % 100 mL IVPB        1 g 200 mL/hr over 30 Minutes Intravenous  Once 09/29/23 2119 09/29/23 2218   09/29/23 2130  metroNIDAZOLE (FLAGYL) IVPB 500 mg        500 mg 100 mL/hr over 60 Minutes Intravenous  Once 09/29/23 2119 09/29/23 2315        Assessment/Plan POD 2 s/p Laparoscopic graham patch of perforated duodenal bulb ulcer by Dr. Dossie Der on 09/30/23 - NGT accidentally pulled out 3/6 AM. Leave out for now. Strict NPO. Plan UGI today - Cont PICC/TPN - Cont IV Zosyn/Fluconazole - Cont JP drain, currently SS - Added NSAIDs to allergy list. Check H. Pylori - Mobilize, PT - Delirium precautions, minimize narcotics - Appreciate TRH assist with underlying medical conditions   FEN - NPO, TPN. Replace hypokalemia.  VTE - SCDs, ppx Lovenox. Okay for therapeutic lovenox or heparin gtt from our standpoint.  ID - Zosyn/Fluconazole Foley - out POD 1, voiding.   Hx DVT, factor V Leiden - discussed w/ pharm. She was not on medication for this pre-op. TRH recommending lifelong anticoagulation. Okay for therapeutic lovenox or heparin gtt from our standpoint.  HLD Elevated BP - cont prn meds. TRH following.    LOS: 2 days    Jacinto Halim, [facility] 10/02/2023, 9:50 AM Please see Amion for pager number during day hours 7:00am-4:30pm

## 2023-10-03 DIAGNOSIS — Z9889 Other specified postprocedural states: Secondary | ICD-10-CM | POA: Diagnosis not present

## 2023-10-03 LAB — BASIC METABOLIC PANEL
Anion gap: 12 (ref 5–15)
BUN: 19 mg/dL (ref 8–23)
CO2: 25 mmol/L (ref 22–32)
Calcium: 9.4 mg/dL (ref 8.9–10.3)
Chloride: 106 mmol/L (ref 98–111)
Creatinine, Ser: 0.82 mg/dL (ref 0.44–1.00)
GFR, Estimated: >60 mL/min (ref >60)
Glucose, Bld: 118 mg/dL — ABNORMAL HIGH (ref 70–99)
Potassium: 4.1 mmol/L (ref 3.5–5.1)
Sodium: 143 mmol/L (ref 135–145)

## 2023-10-03 LAB — CBC
HCT: 33.8 % — ABNORMAL LOW (ref 36.0–46.0)
Hemoglobin: 11.3 g/dL — ABNORMAL LOW (ref 12.0–15.0)
MCH: 31.9 pg (ref 26.0–34.0)
MCHC: 33.4 g/dL (ref 30.0–36.0)
MCV: 95.5 fL (ref 80.0–100.0)
Platelets: 254 K/uL (ref 150–400)
RBC: 3.54 MIL/uL — ABNORMAL LOW (ref 3.87–5.11)
RDW: 14.4 % (ref 11.5–15.5)
WBC: 9.2 K/uL (ref 4.0–10.5)
nRBC: 0 % (ref 0.0–0.2)

## 2023-10-03 LAB — GLUCOSE, CAPILLARY
Glucose-Capillary: 125 mg/dL — ABNORMAL HIGH (ref 70–99)
Glucose-Capillary: 108 mg/dL — ABNORMAL HIGH (ref 70–99)
Glucose-Capillary: 121 mg/dL — ABNORMAL HIGH (ref 70–99)
Glucose-Capillary: 127 mg/dL — ABNORMAL HIGH (ref 70–99)
Glucose-Capillary: 137 mg/dL — ABNORMAL HIGH (ref 70–99)

## 2023-10-03 LAB — MAGNESIUM: Magnesium: 2.1 mg/dL (ref 1.7–2.4)

## 2023-10-03 MED ORDER — TRAVASOL 10 % IV SOLN
INTRAVENOUS | Status: AC
  Filled 2023-10-03: qty 700.1

## 2023-10-03 MED ORDER — PANTOPRAZOLE SODIUM 40 MG IV SOLR
40.0000 mg | Freq: Two times a day (BID) | INTRAVENOUS | Status: DC
  Administered 2023-10-03 – 2023-10-04 (×3): 40 mg via INTRAVENOUS
  Filled 2023-10-03 (×3): qty 10

## 2023-10-03 NOTE — Progress Notes (Signed)
 3 Days Post-Op  Subjective: Feeling well this morning. Tolerating clear liquids, denies nausea/vomiting.   Objective: Vital signs in last 24 hours: Temp:  [97.8 F (36.6 C)-99.2 F (37.3 C)] 98.5 F (36.9 C) (03/08 0900) Pulse Rate:  [73-100] 79 (03/08 0907) Resp:  [16-23] 20 (03/08 0344) BP: (131-161)/(79-86) 161/82 (03/08 0907) SpO2:  [92 %-98 %] 96 % (03/08 0907) Weight:  [60.8 kg] 60.8 kg (03/08 0500) Last BM Date : 10/03/23  Intake/Output from previous day: 03/07 0701 - 03/08 0700 In: -  Out: 310 [Urine:275; Drains:35] Intake/Output this shift: No intake/output data recorded.  PE: Gen:  Alert, NAD, pleasant Card:  Reg Pulm: normal work of breathing on room air Abd: Soft, nondistended, incisions clean and dry. JP with serosanguinous drainage. Ext:  No LE edema  Neuro: alert, no focal deficitis  Lab Results:  Recent Labs    10/02/23 0503 10/03/23 0500  WBC 17.0* 9.2  HGB 12.7 11.3*  HCT 37.2 33.8*  PLT 292 254   BMET Recent Labs    10/02/23 0503 10/03/23 0813  NA 136 143  K 3.3* 4.1  CL 103 106  CO2 25 25  GLUCOSE 131* 118*  BUN 11 19  CREATININE 0.73 0.82  CALCIUM 8.7* 9.4   PT/INR No results for input(s): "LABPROT", "INR" in the last 72 hours. CMP     Component Value Date/Time   NA 143 10/03/2023 0813   NA 142 06/02/2022 1351   NA 143 05/30/2013 1040   K 4.1 10/03/2023 0813   K 4.6 05/30/2013 1040   CL 106 10/03/2023 0813   CL 105 05/05/2012 1058   CO2 25 10/03/2023 0813   CO2 27 05/30/2013 1040   GLUCOSE 118 (H) 10/03/2023 0813   GLUCOSE 98 05/30/2013 1040   GLUCOSE 93 05/05/2012 1058   BUN 19 10/03/2023 0813   BUN 9 06/02/2022 1351   BUN 14.4 05/30/2013 1040   CREATININE 0.82 10/03/2023 0813   CREATININE 1.1 05/30/2013 1040   CALCIUM 9.4 10/03/2023 0813   CALCIUM 10.1 05/30/2013 1040   PROT 5.6 (L) 10/01/2023 0535   PROT 7.1 06/02/2022 1351   PROT 7.7 05/30/2013 1040   ALBUMIN 2.8 (L) 10/01/2023 0535   ALBUMIN 4.8  06/02/2022 1351   ALBUMIN 3.8 05/30/2013 1040   AST 16 10/01/2023 0535   AST 26 05/30/2013 1040   ALT 18 10/01/2023 0535   ALT 22 05/30/2013 1040   ALKPHOS 64 10/01/2023 0535   ALKPHOS 88 05/30/2013 1040   BILITOT 0.5 10/01/2023 0535   BILITOT 0.5 06/02/2022 1351   BILITOT 0.59 05/30/2013 1040   GFRNONAA >60 10/03/2023 0813   GFRAA 72 (L) 11/15/2013 0630   Lipase     Component Value Date/Time   LIPASE 20 09/29/2023 1751    Studies/Results: DG UGI W SINGLE CM (SOL OR THIN BA) Result Date: 10/02/2023 CLINICAL DATA:  78 year old female with anterior duodenal bulb perforation status post surgical repair on 09/30/2023. Request for water-soluble UGI. EXAM: DG UGI W SINGLE CM TECHNIQUE: Scout radiograph was obtained. Single contrast examination was performed using water-soluble contrast. This exam was performed by Lawernce Ion, PA-C, and was supervised and interpreted by Roanna Banning, MD. FLUOROSCOPY: Radiation Exposure Index (as provided by the fluoroscopic device): 30.7 mGy Kerma COMPARISON:  CT AP, 09/29/2023.  KUB, 09/30/2023. FINDINGS: Scout Radiograph: Nonspecific gas pattern. Esophagus:  Limited exam showed smooth contour of the esophagus. Esophageal motility:  Not examined. Gastroesophageal reflux:  Not examined. Ingested 13mm barium tablet:  Not given. Stomach: Limited exam showed smooth contour of the stomach. Large hiatal hernia. Gastric emptying: Normal. Duodenum: No postoperative leak noted from the duodenal bulb. Other:  Thoracolumbar scoliosis. IMPRESSION: 1. No fluoroscopic evidence of postoperative leak at the duodenal bulb. 2.  Large hiatal hernia. Electronically Signed   By: Roanna Banning M.D.   On: 10/02/2023 14:20        Assessment/Plan POD 3 s/p Laparoscopic graham patch of perforated duodenal bulb ulcer by Dr. Dossie Der on 09/30/23 - NGT accidentally pulled out 3/6 AM. Upper GI 3/7 with no evidence of leak. Advanced to clear liquids, continue advancing to full liquids  today. - Continue TPN until tolerating PO intake regularly, can likely wean or stop tomorrow. - Cont IV Zosyn/Fluconazole for 4-5 days postop. - Cont JP drain, currently SS - Avoid NSAIDs - H pylori stool antigen pending - Mobilize, PT - Delirium precautions, minimize narcotics - Appreciate TRH assist with underlying medical conditions  - BID PPI  FEN - FLD, TPN, BID PPI VTE - SCDs, lovenox ID - Zosyn/Fluconazole  Hx DVT, factor V Leiden - discussed w/ pharm. She was not on medication for this pre-op. Discussed with TRH today, patient is following with PCP for this.    LOS: 3 days    Fritzi Mandes, MD [facility] 10/03/2023, 9:47 AM Please see Amion for pager number during day hours 7:00am-4:30pm

## 2023-10-03 NOTE — Progress Notes (Signed)
 PROGRESS NOTE    Samantha Ali  WUJ:[id number] DOB: 1946/01/13 DOA: 09/29/2023 PCP: Sigmund Hazel, MD    Brief Narrative:   Samantha Ali is a 78 y.o. female with past medical history of hyperlipidemia, CKD, history of DVT, factor V Leiden mutation, history of breast cancer, and osteoporosis initially presented to the hospital with acute on abdomen on 09/29/2023.  CT scan done showed perforated duodenal versus gastric ulcer and patient underwent emergent diagnostic laparotomy on 3 5.  There was note of perforation in the anterior portion of duodenal bulb with purulent peritonitis, status post laparoscopic repair and JP drain was placed and patient was transferred to the ICU l intubated.  Patient was started on empiric antibiotics of Zosyn and fluconazole.  Subsequently patient was transferred out of the ICU.  Patient was noted to be agitated and had pulled out NG tube and was noted to have fever tachycardia, tachypnea and elevated blood pressure so medical team was consulted for further evaluation and treatment.     Assessment and Plan:  Sepsis secondary to duodenal bulb perforation s/p laparoscopic repair Peritonitis secondary to perforation  Status post laparoscopic Cheree Ditto patch of perforated duodenal bulb ulcer by Dr. Dossie Der on 09/30/23.  Blood cultures negative in 3 days. On  IV Zosyn and fluconazole.  Leukocytosis has normalized today.  Temperature max of 99.2 F.  Will continue to monitor closely.  Has been started on TPN.  Patient underwent upper GI study 3/7 with no evidence of postoperative leak at the duodenal bulb.  Large hiatal hernia.Marland Kitchen  Has been advised on full liquid diet.  Currently on TPN as well.  Acute hypoxic respiratory failure Resolved.  On room air.  Initially was intubated in the ICU.  Delirium Likely hospital induced delirium in the background of mild to moderate dementia as per neurology in the past.   Treated with a rivastigmine patch as unable to tolerate memantine and  donepezil previously.  Continue patch soft restraints delirium precautions, on restraints at this time.   Elevated blood pressure. Not on antihypertensives as outpatient.  Currently on as needed metoprolol.   History of recurrent DVT Heterozygote factor 5 Leiden mutation Patient with prior history of DVT  in the 1990s and subsequent 07/2013 after knee replacement was thought to be provoked.  Records note patient was about to complete her course for Xarelto when found to have extension of prior DVT requiring hospitalization 10/2013.  Questioned possible failure of Xarelto.  She was switched to Coumadin which she continued on Xarelto until 06/2014 when it was discontinued by her primary.  Spoke with the patient's husband at bedside at length.  Patient is currently not on anticoagulation for some time now.  No plans for initiating anticoagulation.  Patient was on aspirin as outpatient.  Will need to follow-up with PCP   Hypokalemia Improved after replacement.  Latest potassium of 4.1.   Acute kidney injury Improved.  Initial creatinine up to 1.2.  Creatinine today at this 0.8.  Improved with IV fluids.      Deconditioning debility.  Seen by PT and recommended skilled nursing facility placement.   DVT prophylaxis: enoxaparin (LOVENOX) injection 40 mg Start: 09/30/23 1000 SCDs Start: 09/30/23 0242   Code Status:     Code Status: Full Code  Disposition: Likely to skilled nursing facility.  As per primary team.  Status is: Inpatient   Family Communication: Spoke son on the phone at bedside on 10/03/2023.   Procedures:   Status post laparoscopic Cheree Ditto  patch of perforated duodenal bulb ulcer by Dr. Dossie Der on 09/30/23.  Antimicrobials:  Fluconazole and Zosyn.  Anti-infectives (From admission, onward)    Start     Dose/Rate Route Frequency Ordered Stop   10/01/23 0400  fluconazole (DIFLUCAN) IVPB 400 mg        400 mg 100 mL/hr over 120 Minutes Intravenous Every 24 hours 09/30/23 0303      09/30/23 0600  piperacillin-tazobactam (ZOSYN) IVPB 3.375 g        3.375 g 12.5 mL/hr over 240 Minutes Intravenous Every 8 hours 09/30/23 0246 10/07/23 0559   09/30/23 0400  fluconazole (DIFLUCAN) IVPB 400 mg        400 mg 100 mL/hr over 120 Minutes Intravenous Every 2 hours 09/30/23 0303 09/30/23 0754   09/29/23 2134  ceFEPIme (MAXIPIME) 1 g injection       Note to Pharmacy: Schuyler Amor: cabinet override      09/29/23 2134 09/30/23 0944   09/29/23 2130  ceFEPIme (MAXIPIME) 1 g in sodium chloride 0.9 % 100 mL IVPB        1 g 200 mL/hr over 30 Minutes Intravenous  Once 09/29/23 2119 09/29/23 2218   09/29/23 2130  metroNIDAZOLE (FLAGYL) IVPB 500 mg        500 mg 100 mL/hr over 60 Minutes Intravenous  Once 09/29/23 2119 09/29/23 2315        Subjective: Today, patient was seen and examined at bedside.   Patient is a poor historian with history of dementia.  Restraints.  No report of nausea vomiting.  Patient denies any abdominal pain shortness of breath or dyspnea.  Has been tolerating clears.  Objective: Vitals:   10/03/23 0344 10/03/23 0500 10/03/23 0900 10/03/23 0907  BP: 131/83   (!) 161/82  Pulse: 73  80 79  Resp: 20     Temp: 97.8 F (36.6 C)  98.5 F (36.9 C)   TempSrc: Oral  Oral Oral  SpO2: 97%   96%  Weight:  60.8 kg    Height:        Intake/Output Summary (Last 24 hours) at 10/03/2023 1028 Last data filed at 10/03/2023 0344 Gross per 24 hour  Intake --  Output 290 ml  Net -290 ml   Filed Weights   10/01/23 0500 10/02/23 0500 10/03/23 0500  Weight: 61.6 kg 60.3 kg 60.8 kg    Physical Examination: Body mass index is 23.74 kg/m.   General:  Average built, not in obvious distress, Communicative, disoriented  on restraints. HENT:   No scleral pallor or icterus noted. Oral mucosa is moist.  Chest:    Diminished breath sounds bilaterally. No crackles or wheezes.  CVS: S1 &S2 heard. No murmur.  Regular rate and rhythm. Abdomen: Soft, nondistended, incision  clean dry and intact.  JP drain in place.  Mild tenderness on palpation.   Extremities: No cyanosis, clubbing or edema.  Peripheral pulses are palpable.  Right upper extremity PICC line in place. Psych: Alert, awake and Communicative, disoriented  CNS:  No cranial nerve deficits.  Moving all extremities. Skin: Warm and dry.  No rashes noted.  Data Reviewed:   CBC: Recent Labs  Lab 09/30/23 0330 09/30/23 0345 09/30/23 0541 10/01/23 0535 10/02/23 0503 10/03/23 0500  WBC 8.4  --  8.7 13.2* 17.0* 9.2  HGB 12.0 11.6* 11.4* 12.1 12.7 11.3*  HCT 35.6* 34.0* 34.1* 36.6 37.2 33.8*  MCV 91.8  --  91.2 90.8 88.8 95.5  PLT 247  --  257 267  292 254    Basic Metabolic Panel: Recent Labs  Lab 09/29/23 1751 09/30/23 0330 09/30/23 0345 10/01/23 0535 10/02/23 0503 10/03/23 0813  NA 135 138 136 136 136 143  K 3.7 3.8 3.8 3.4* 3.3* 4.1  CL 98 107  --  108 103 106  CO2 25 22  --  23 25 25   GLUCOSE 126* 164*  --  137* 131* 118*  BUN 33* 18  --  11 11 19   CREATININE 1.20* 0.72  --  0.76 0.73 0.82  CALCIUM 10.1 8.7*  --  7.9* 8.7* 9.4  MG  --  1.7  --  2.0 2.0 2.1  PHOS  --  3.9  --  2.1*  --   --     Liver Function Tests: Recent Labs  Lab 09/29/23 1751 10/01/23 0535  AST 11* 16  ALT 10 18  ALKPHOS 101 64  BILITOT 0.6 0.5  PROT 7.3 5.6*  ALBUMIN 4.6 2.8*     Radiology Studies: DG UGI W SINGLE CM (SOL OR THIN BA) Result Date: 10/02/2023 CLINICAL DATA:  78 year old female with anterior duodenal bulb perforation status post surgical repair on 09/30/2023. Request for water-soluble UGI. EXAM: DG UGI W SINGLE CM TECHNIQUE: Scout radiograph was obtained. Single contrast examination was performed using water-soluble contrast. This exam was performed by Lawernce Ion, PA-C, and was supervised and interpreted by Roanna Banning, MD. FLUOROSCOPY: Radiation Exposure Index (as provided by the fluoroscopic device): 30.7 mGy Kerma COMPARISON:  CT AP, 09/29/2023.  KUB, 09/30/2023. FINDINGS: Scout  Radiograph: Nonspecific gas pattern. Esophagus:  Limited exam showed smooth contour of the esophagus. Esophageal motility:  Not examined. Gastroesophageal reflux:  Not examined. Ingested 13mm barium tablet:  Not given. Stomach: Limited exam showed smooth contour of the stomach. Large hiatal hernia. Gastric emptying: Normal. Duodenum: No postoperative leak noted from the duodenal bulb. Other:  Thoracolumbar scoliosis. IMPRESSION: 1. No fluoroscopic evidence of postoperative leak at the duodenal bulb. 2.  Large hiatal hernia. Electronically Signed   By: Roanna Banning M.D.   On: 10/02/2023 14:20      LOS: 3 days    Joycelyn Das, MD [facility] Available via Epic secure chat 7am-7pm After these hours, please refer to coverage provider listed on [email or web address] 10/03/2023, 10:28 AM

## 2023-10-03 NOTE — Progress Notes (Addendum)
 PHARMACY - TOTAL PARENTERAL NUTRITION CONSULT NOTE   Indication:  anticipated prolonged NPO following duodenal ulcer repair   Patient Measurements: Height: 5\' 3"  (160 cm) Weight: 60.8 kg (134 lb 0.6 oz) IBW/kg (Calculated) : 52.4 TPN AdjBW (KG): 59 Body mass index is 23.74 kg/m. Usual Weight: 59kg on admission   Assessment:  Patient admitted with chief compliant of abdominal pain. CT abdomen suggests perforated pyloric channel ulcer but also could be a colon pathology due to recent constipation. Patient underwent ex-lap on 3/5 which should perforated duodenal ulcer with pneumoperitoneum and purulent peritonitis. Ulcer was then patched and patient was closed. Anticipate patient to be NPO for at least 5 days until able to undergo an UGI to ensure duodenal repair was successful. Pharmacy consulted to start TPN.   Patient previously intubated and was unable to be interviewed for prior to admission nutritional information. NGT pulled out by patient 3/06 AM, surgery to leave out for now. Patient to remain NPO for UGI procedure 3/07.   Glucose / Insulin: no hx of DM, no recent A1C, BG 125-142, used 6 units insulin/24 hr Electrolytes: K: 4.1, Mg: 2.1, Phos 2.1, HCO3: 25, Cl: 106, CoCa 10.4, all others WNL  Renal: Scr/BUN WNL  Hepatic: T-bili 0.6, AST/ALT WNL, albumin: 2.8, alk phos WNL, TG: 56  Intake / Output; MIVF: UOP 0.8 ml/kg/hr, 50mL drain, net +2.9L, LR 41mL/hr   GI Imaging: 3/04 CT abdomen: moderate free fluid in abdomen consistent with perforation, potential duodenal ulcer  GI Surgeries / Procedures:  3/05: ex-lap and duodenal ulcer repair   Central access: PICC to be placed 3/05  TPN start date: 3/05   Nutritional Goals: Goal TPN rate is 60 mL/hr (provides 70 g of protein and 1500 kcals per day, meeting 100% of nutritional needs)  RD Assessment: 1500-1700 kcal  70-90 grams protein  Estimated Needs Total Energy Estimated Needs: 1500-1700 kcal Total Protein Estimated Needs:  70-90 gm Total Fluid Estimated Needs: > 1.5L/day  Current Nutrition:  NPO and TPN 3/07 CLD (no nausea/vomiting reported) 3/08 FLD  Plan: Continue TPN at 60 mL/hr to provide 100% of nutritional needs Electrolytes in TPN: Na 25 mEq/L, K to 40 mEq/L, Ca 5 mEq/L, Mg 3 mEq/L, and Phos 15 mmol/L. Cl:Ac 1:2  IV Kcl 40 mEq outside of TPN Continue standard MVI and trace elements to TPN Adjust Sensitive q6h SSI and adjust as needed  Monitor TPN labs on Mon/Thurs, daily until stabilization F/u toleration of diet, plans for weaning TPN - tenatively starting 3/09  Thank you for allowing pharmacy to be a part of this patient's care.  Thelma Barge, PharmD, BCPS Clinical Pharmacist

## 2023-10-03 NOTE — Plan of Care (Signed)
  Problem: Education: Goal: Knowledge of General Education information will improve Description: Including pain rating scale, medication(s)/side effects and non-pharmacologic comfort measures Outcome: Progressing   Problem: Health Behavior/Discharge Planning: Goal: Ability to manage health-related needs will improve Outcome: Progressing   Problem: Clinical Measurements: Goal: Ability to maintain clinical measurements within normal limits will improve Outcome: Progressing Goal: Will remain free from infection Outcome: Progressing Goal: Diagnostic test results will improve Outcome: Progressing Goal: Respiratory complications will improve Outcome: Progressing Goal: Cardiovascular complication will be avoided Outcome: Progressing   Problem: Activity: Goal: Risk for activity intolerance will decrease Outcome: Progressing   Problem: Nutrition: Goal: Adequate nutrition will be maintained Outcome: Progressing   Problem: Coping: Goal: Level of anxiety will decrease Outcome: Progressing   Problem: Elimination: Goal: Will not experience complications related to bowel motility Outcome: Progressing Goal: Will not experience complications related to urinary retention Outcome: Progressing   Problem: Pain Managment: Goal: General experience of comfort will improve and/or be controlled Outcome: Progressing   Problem: Safety: Goal: Ability to remain free from injury will improve Outcome: Progressing   Problem: Skin Integrity: Goal: Risk for impaired skin integrity will decrease Outcome: Progressing   Problem: Activity: Goal: Ability to tolerate increased activity will improve Outcome: Progressing   Problem: Respiratory: Goal: Ability to maintain a clear airway and adequate ventilation will improve Outcome: Progressing   Problem: Role Relationship: Goal: Method of communication will improve Outcome: Progressing   Problem: Safety: Goal: Non-violent Restraint(s) Outcome:  Progressing   Problem: Education: Goal: Ability to describe self-care measures that may prevent or decrease complications (Diabetes Survival Skills Education) will improve Outcome: Progressing Goal: Individualized Educational Video(s) Outcome: Progressing   Problem: Coping: Goal: Ability to adjust to condition or change in health will improve Outcome: Progressing   Problem: Fluid Volume: Goal: Ability to maintain a balanced intake and output will improve Outcome: Progressing   Problem: Health Behavior/Discharge Planning: Goal: Ability to identify and utilize available resources and services will improve Outcome: Progressing Goal: Ability to manage health-related needs will improve Outcome: Progressing   Problem: Metabolic: Goal: Ability to maintain appropriate glucose levels will improve Outcome: Progressing   Problem: Nutritional: Goal: Maintenance of adequate nutrition will improve Outcome: Progressing Goal: Progress toward achieving an optimal weight will improve Outcome: Progressing   Problem: Skin Integrity: Goal: Risk for impaired skin integrity will decrease Outcome: Progressing   Problem: Tissue Perfusion: Goal: Adequacy of tissue perfusion will improve Outcome: Progressing

## 2023-10-04 DIAGNOSIS — Z9889 Other specified postprocedural states: Secondary | ICD-10-CM | POA: Diagnosis not present

## 2023-10-04 LAB — GLUCOSE, CAPILLARY
Glucose-Capillary: 104 mg/dL — ABNORMAL HIGH (ref 70–99)
Glucose-Capillary: 124 mg/dL — ABNORMAL HIGH (ref 70–99)
Glucose-Capillary: 123 mg/dL — ABNORMAL HIGH (ref 70–99)
Glucose-Capillary: 92 mg/dL (ref 70–99)

## 2023-10-04 LAB — CBC
HCT: 33.3 % — ABNORMAL LOW (ref 36.0–46.0)
Hemoglobin: 10.8 g/dL — ABNORMAL LOW (ref 12.0–15.0)
MCH: 31.2 pg (ref 26.0–34.0)
MCHC: 32.4 g/dL (ref 30.0–36.0)
MCV: 96.2 fL (ref 80.0–100.0)
Platelets: 260 10*3/uL (ref 150–400)
RBC: 3.46 MIL/uL — ABNORMAL LOW (ref 3.87–5.11)
RDW: 14.3 % (ref 11.5–15.5)
WBC: 8.2 10*3/uL (ref 4.0–10.5)
nRBC: 0 % (ref 0.0–0.2)

## 2023-10-04 LAB — BASIC METABOLIC PANEL
Anion gap: 10 (ref 5–15)
BUN: 19 mg/dL (ref 8–23)
CO2: 23 mmol/L (ref 22–32)
Calcium: 9.1 mg/dL (ref 8.9–10.3)
Chloride: 103 mmol/L (ref 98–111)
Creatinine, Ser: 0.75 mg/dL (ref 0.44–1.00)
GFR, Estimated: >60 mL/min (ref >60)
Glucose, Bld: 124 mg/dL — ABNORMAL HIGH (ref 70–99)
Potassium: 4.1 mmol/L (ref 3.5–5.1)
Sodium: 136 mmol/L (ref 135–145)

## 2023-10-04 LAB — MAGNESIUM: Magnesium: 2.1 mg/dL (ref 1.7–2.4)

## 2023-10-04 MED ORDER — PANTOPRAZOLE SODIUM 40 MG PO TBEC
40.0000 mg | DELAYED_RELEASE_TABLET | Freq: Two times a day (BID) | ORAL | Status: DC
  Administered 2023-10-04 – 2023-10-07 (×6): 40 mg via ORAL
  Filled 2023-10-04 (×6): qty 1

## 2023-10-04 NOTE — Plan of Care (Signed)
 Problem: Education: Goal: Knowledge of General Education information will improve Description: Including pain rating scale, medication(s)/side effects and non-pharmacologic comfort measures 10/04/2023 2351 by Carlene Coria, RN Outcome: Progressing 10/04/2023 2019 by Carlene Coria, RN Outcome: Progressing   Problem: Health Behavior/Discharge Planning: Goal: Ability to manage health-related needs will improve 10/04/2023 2351 by Carlene Coria, RN Outcome: Progressing 10/04/2023 2019 by Carlene Coria, RN Outcome: Progressing   Problem: Clinical Measurements: Goal: Ability to maintain clinical measurements within normal limits will improve 10/04/2023 2351 by Carlene Coria, RN Outcome: Progressing 10/04/2023 2019 by Carlene Coria, RN Outcome: Progressing Goal: Will remain free from infection 10/04/2023 2351 by Carlene Coria, RN Outcome: Progressing 10/04/2023 2019 by Carlene Coria, RN Outcome: Progressing Goal: Diagnostic test results will improve 10/04/2023 2351 by Carlene Coria, RN Outcome: Progressing 10/04/2023 2019 by Carlene Coria, RN Outcome: Progressing Goal: Respiratory complications will improve 10/04/2023 2351 by Carlene Coria, RN Outcome: Progressing 10/04/2023 2019 by Carlene Coria, RN Outcome: Progressing Goal: Cardiovascular complication will be avoided 10/04/2023 2351 by Carlene Coria, RN Outcome: Progressing 10/04/2023 2019 by Carlene Coria, RN Outcome: Progressing   Problem: Activity: Goal: Risk for activity intolerance will decrease 10/04/2023 2351 by Carlene Coria, RN Outcome: Progressing 10/04/2023 2019 by Carlene Coria, RN Outcome: Progressing   Problem: Nutrition: Goal: Adequate nutrition will be maintained 10/04/2023 2351 by Carlene Coria, RN Outcome: Progressing 10/04/2023 2019 by Carlene Coria, RN Outcome: Progressing   Problem: Coping: Goal: Level of anxiety will decrease 10/04/2023  2351 by Carlene Coria, RN Outcome: Progressing 10/04/2023 2019 by Carlene Coria, RN Outcome: Progressing   Problem: Elimination: Goal: Will not experience complications related to bowel motility 10/04/2023 2351 by Carlene Coria, RN Outcome: Progressing 10/04/2023 2019 by Carlene Coria, RN Outcome: Progressing Goal: Will not experience complications related to urinary retention 10/04/2023 2351 by Carlene Coria, RN Outcome: Progressing 10/04/2023 2019 by Carlene Coria, RN Outcome: Progressing   Problem: Pain Managment: Goal: General experience of comfort will improve and/or be controlled 10/04/2023 2351 by Carlene Coria, RN Outcome: Progressing 10/04/2023 2019 by Carlene Coria, RN Outcome: Progressing   Problem: Safety: Goal: Ability to remain free from injury will improve 10/04/2023 2351 by Carlene Coria, RN Outcome: Progressing 10/04/2023 2019 by Carlene Coria, RN Outcome: Progressing   Problem: Skin Integrity: Goal: Risk for impaired skin integrity will decrease 10/04/2023 2351 by Carlene Coria, RN Outcome: Progressing 10/04/2023 2019 by Carlene Coria, RN Outcome: Progressing   Problem: Activity: Goal: Ability to tolerate increased activity will improve 10/04/2023 2351 by Carlene Coria, RN Outcome: Progressing 10/04/2023 2019 by Carlene Coria, RN Outcome: Progressing   Problem: Respiratory: Goal: Ability to maintain a clear airway and adequate ventilation will improve 10/04/2023 2351 by Carlene Coria, RN Outcome: Progressing 10/04/2023 2019 by Carlene Coria, RN Outcome: Progressing   Problem: Role Relationship: Goal: Method of communication will improve 10/04/2023 2351 by Carlene Coria, RN Outcome: Progressing 10/04/2023 2019 by Carlene Coria, RN Outcome: Progressing   Problem: Safety: Goal: Non-violent Restraint(s) 10/04/2023 2351 by Carlene Coria, RN Outcome: Progressing 10/04/2023 2019 by  Carlene Coria, RN Outcome: Progressing   Problem: Education: Goal: Ability to describe self-care measures that may prevent or decrease complications (Diabetes Survival Skills Education) will improve 10/04/2023 2351 by Carlene Coria, RN Outcome: Progressing 10/04/2023 2019 by Carlene Coria, RN Outcome: Progressing Goal: Individualized  Educational Video(s) 10/04/2023 2351 by Carlene Coria, RN Outcome: Progressing 10/04/2023 2019 by Carlene Coria, RN Outcome: Progressing   Problem: Coping: Goal: Ability to adjust to condition or change in health will improve 10/04/2023 2351 by Carlene Coria, RN Outcome: Progressing 10/04/2023 2019 by Carlene Coria, RN Outcome: Progressing   Problem: Fluid Volume: Goal: Ability to maintain a balanced intake and output will improve 10/04/2023 2351 by Carlene Coria, RN Outcome: Progressing 10/04/2023 2019 by Carlene Coria, RN Outcome: Progressing   Problem: Health Behavior/Discharge Planning: Goal: Ability to identify and utilize available resources and services will improve 10/04/2023 2351 by Carlene Coria, RN Outcome: Progressing 10/04/2023 2019 by Carlene Coria, RN Outcome: Progressing Goal: Ability to manage health-related needs will improve 10/04/2023 2351 by Carlene Coria, RN Outcome: Progressing 10/04/2023 2019 by Carlene Coria, RN Outcome: Progressing   Problem: Metabolic: Goal: Ability to maintain appropriate glucose levels will improve 10/04/2023 2351 by Carlene Coria, RN Outcome: Progressing 10/04/2023 2019 by Carlene Coria, RN Outcome: Progressing   Problem: Nutritional: Goal: Maintenance of adequate nutrition will improve 10/04/2023 2351 by Carlene Coria, RN Outcome: Progressing 10/04/2023 2019 by Carlene Coria, RN Outcome: Progressing Goal: Progress toward achieving an optimal weight will improve 10/04/2023 2351 by Carlene Coria, RN Outcome: Progressing 10/04/2023  2019 by Carlene Coria, RN Outcome: Progressing   Problem: Skin Integrity: Goal: Risk for impaired skin integrity will decrease 10/04/2023 2351 by Carlene Coria, RN Outcome: Progressing 10/04/2023 2019 by Carlene Coria, RN Outcome: Progressing   Problem: Tissue Perfusion: Goal: Adequacy of tissue perfusion will improve 10/04/2023 2351 by Carlene Coria, RN Outcome: Progressing 10/04/2023 2019 by Carlene Coria, RN Outcome: Progressing

## 2023-10-04 NOTE — Progress Notes (Addendum)
 PHARMACY - TOTAL PARENTERAL NUTRITION CONSULT NOTE   Indication:  anticipated prolonged NPO following duodenal ulcer repair   Patient Measurements: Height: 5\' 3"  (160 cm) Weight: 60.8 kg (134 lb 0.6 oz) IBW/kg (Calculated) : 52.4 TPN AdjBW (KG): 59 Body mass index is 23.74 kg/m. Usual Weight: 59kg on admission   Assessment:  Patient admitted with chief compliant of abdominal pain. CT abdomen suggests perforated pyloric channel ulcer but also could be a colon pathology due to recent constipation. Patient underwent ex-lap on 3/5 which should perforated duodenal ulcer with pneumoperitoneum and purulent peritonitis. Ulcer was then patched and patient was closed. Anticipate patient to be NPO for at least 5 days until able to undergo an UGI to ensure duodenal repair was successful. Pharmacy consulted to start TPN.   Patient previously intubated and was unable to be interviewed for prior to admission nutritional information. NGT pulled out by patient 3/06 AM, surgery to leave out for now. Patient to remain NPO for UGI procedure 3/07. Tolerated clears and full liquid diet 3/08. Progressing to regular diet 3/09.  Glucose / Insulin: no hx of DM, no recent A1C, BG 125-142, used 2 units insulin/24 hr Electrolytes: K: 4.1, Mg: 2.1, Phos 2.1, HCO3: 25, Cl: 106, CoCa 10.4, all others WNL  Renal: Scr/BUN WNL  Hepatic: T-bili 0.6, AST/ALT WNL, albumin: 2.8, alk phos WNL, TG: 56  Intake / Output; MIVF: UOP 0.8 ml/kg/hr, 50mL drain, net +2.9L, LR 24mL/hr   GI Imaging: 3/04 CT abdomen: moderate free fluid in abdomen consistent with perforation, potential duodenal ulcer  GI Surgeries / Procedures:  3/05: ex-lap and duodenal ulcer repair   Central access: PICC to be placed 3/05  TPN start date: 3/05   Nutritional Goals: Goal TPN rate is 60 mL/hr (provides 70 g of protein and 1500 kcals per day, meeting 100% of nutritional needs)  RD Assessment: 1500-1700 kcal  70-90 grams protein  Estimated  Needs Total Energy Estimated Needs: 1500-1700 kcal Total Protein Estimated Needs: 70-90 gm Total Fluid Estimated Needs: > 1.5L/day  Current Nutrition:  NPO and TPN 3/07 CLD (no nausea/vomiting reported) 3/08 FLD 3/09 regular diet  Plan:  Stop TPN after current bag finishes @ 1800 Consider discontinuing Sensitive q6h SSI once TPN completes  Thank you for allowing pharmacy to be a part of this patient's care.  Thelma Barge, PharmD, BCPS Clinical Pharmacist

## 2023-10-04 NOTE — Progress Notes (Signed)
 4 Days Post-Op  Subjective: No acute complaints this morning. Denies abdominal pain, nausea and vomiting.  Objective: Vital signs in last 24 hours: Temp:  [96.5 F (35.8 C)-98.6 F (37 C)] 98.6 F (37 C) (03/09 0817) Pulse Rate:  [73-87] 73 (03/09 0320) Resp:  [18-24] 20 (03/09 0817) BP: (134-156)/(71-98) 156/98 (03/09 0817) SpO2:  [92 %-98 %] 95 % (03/09 0320) Weight:  [60.8 kg] 60.8 kg (03/09 0442) Last BM Date : 10/03/23  Intake/Output from previous day: 03/08 0701 - 03/09 0700 In: 903.8 [I.V.:603.8; IV Piggyback:300] Out: -  Intake/Output this shift: No intake/output data recorded.  PE: Gen:  Alert, NAD, pleasant Pulm: normal work of breathing on room air Abd: Soft, nondistended, incisions clean and dry. JP with serous fluid. Neuro: alert, no focal deficitis  Lab Results:  Recent Labs    10/02/23 0503 10/03/23 0500  WBC 17.0* 9.2  HGB 12.7 11.3*  HCT 37.2 33.8*  PLT 292 254   BMET Recent Labs    10/02/23 0503 10/03/23 0813  NA 136 143  K 3.3* 4.1  CL 103 106  CO2 25 25  GLUCOSE 131* 118*  BUN 11 19  CREATININE 0.73 0.82  CALCIUM 8.7* 9.4   PT/INR No results for input(s): "LABPROT", "INR" in the last 72 hours. CMP     Component Value Date/Time   NA 143 10/03/2023 0813   NA 142 06/02/2022 1351   NA 143 05/30/2013 1040   K 4.1 10/03/2023 0813   K 4.6 05/30/2013 1040   CL 106 10/03/2023 0813   CL 105 05/05/2012 1058   CO2 25 10/03/2023 0813   CO2 27 05/30/2013 1040   GLUCOSE 118 (H) 10/03/2023 0813   GLUCOSE 98 05/30/2013 1040   GLUCOSE 93 05/05/2012 1058   BUN 19 10/03/2023 0813   BUN 9 06/02/2022 1351   BUN 14.4 05/30/2013 1040   CREATININE 0.82 10/03/2023 0813   CREATININE 1.1 05/30/2013 1040   CALCIUM 9.4 10/03/2023 0813   CALCIUM 10.1 05/30/2013 1040   PROT 5.6 (L) 10/01/2023 0535   PROT 7.1 06/02/2022 1351   PROT 7.7 05/30/2013 1040   ALBUMIN 2.8 (L) 10/01/2023 0535   ALBUMIN 4.8 06/02/2022 1351   ALBUMIN 3.8 05/30/2013  1040   AST 16 10/01/2023 0535   AST 26 05/30/2013 1040   ALT 18 10/01/2023 0535   ALT 22 05/30/2013 1040   ALKPHOS 64 10/01/2023 0535   ALKPHOS 88 05/30/2013 1040   BILITOT 0.5 10/01/2023 0535   BILITOT 0.5 06/02/2022 1351   BILITOT 0.59 05/30/2013 1040   GFRNONAA >60 10/03/2023 0813   GFRAA 72 (L) 11/15/2013 0630   Lipase     Component Value Date/Time   LIPASE 20 09/29/2023 1751    Studies/Results: DG UGI W SINGLE CM (SOL OR THIN BA) Result Date: 10/02/2023 CLINICAL DATA:  78 year old female with anterior duodenal bulb perforation status post surgical repair on 09/30/2023. Request for water-soluble UGI. EXAM: DG UGI W SINGLE CM TECHNIQUE: Scout radiograph was obtained. Single contrast examination was performed using water-soluble contrast. This exam was performed by Lawernce Ion, PA-C, and was supervised and interpreted by Roanna Banning, MD. FLUOROSCOPY: Radiation Exposure Index (as provided by the fluoroscopic device): 30.7 mGy Kerma COMPARISON:  CT AP, 09/29/2023.  KUB, 09/30/2023. FINDINGS: Scout Radiograph: Nonspecific gas pattern. Esophagus:  Limited exam showed smooth contour of the esophagus. Esophageal motility:  Not examined. Gastroesophageal reflux:  Not examined. Ingested 13mm barium tablet:  Not given. Stomach: Limited exam showed smooth  contour of the stomach. Large hiatal hernia. Gastric emptying: Normal. Duodenum: No postoperative leak noted from the duodenal bulb. Other:  Thoracolumbar scoliosis. IMPRESSION: 1. No fluoroscopic evidence of postoperative leak at the duodenal bulb. 2.  Large hiatal hernia. Electronically Signed   By: Roanna Banning M.D.   On: 10/02/2023 14:20        Assessment/Plan POD 4 s/p Laparoscopic graham patch of perforated duodenal bulb ulcer by Dr. Dossie Der on 09/30/23 - NGT accidentally pulled out 3/6 AM. Upper GI 3/7 with no evidence of leak. Tolerating liquids and drain remains serous, advance to soft diet. - Discontinue TPN after current bag runs  out. - Cont IV Zosyn/Fluconazole until POD5 (one day remaining). - Keep JP drain today, remove prior to discharge if remains serous. - Avoid NSAIDs - H pylori stool antigen pending - Mobilize, PT - Delirium precautions, minimize narcotics - Appreciate TRH assist with underlying medical conditions  - BID PPI  FEN - Soft diet, BID PPI VTE - SCDs, lovenox ID - Zosyn/Fluconazole, day 4/5 Dispo: progressive care, PT currently recommending SNF at discharge. Anticipate patient will be medically stable for discharge in next 1-2 days.  Hx DVT, factor V Leiden - discussed w/ pharm. She was not on medication for this pre-op. Discussed with TRH, patient is following with PCP for this.    LOS: 4 days    Fritzi Mandes, MD [facility] Surgery 10/04/2023, 9:18 AM Please see Amion for pager number during day hours 7:00am-4:30pm

## 2023-10-04 NOTE — Progress Notes (Signed)
 PROGRESS NOTE    Samantha Ali  AVW:[id number] DOB: 07-17-1946 DOA: 09/29/2023 PCP: Sigmund Hazel, MD    Brief Narrative:   Samantha Ali is a 78 y.o. female with past medical history of hyperlipidemia, CKD, history of DVT, factor V Leiden mutation, history of breast cancer, and osteoporosis initially presented to the hospital with acute on abdomen on 09/29/2023.  CT scan done showed perforated duodenal versus gastric ulcer and patient underwent emergent diagnostic laparotomy on 3 5.  There was note of perforation in the anterior portion of duodenal bulb with purulent peritonitis, status post laparoscopic repair and JP drain was placed and patient was transferred to the ICU l intubated.  Patient was started on empiric antibiotics of Zosyn and fluconazole.  Subsequently patient was transferred out of the ICU.  Patient was noted to be agitated and had pulled out NG tube and was noted to have fever tachycardia, tachypnea and elevated blood pressure so medical team was consulted for further evaluation and treatment.     Assessment and Plan:  Sepsis secondary to duodenal bulb perforation s/p laparoscopic repair Peritonitis secondary to perforation  Status post laparoscopic Cheree Ditto patch of perforated duodenal bulb ulcer by Dr. Dossie Der on 09/30/23.  Blood cultures negative in 4 days. On  IV Zosyn and fluconazole.  Leukocytosis has normalized x2 Temperature max of 98.40F.  on TPN.  Patient underwent upper GI study 3/7 with no evidence of postoperative leak at the duodenal bulb.  Large hiatal hernia.Marland Kitchen  Has been advanced to soft diet today.  Plan is to discontinue TPN after today's dose.    Acute hypoxic respiratory failure Resolved.  On room air.  Initially was intubated in the ICU.  Delirium Likely hospital induced delirium in the background of mild to moderate dementia as per neurology in the past.   Treated with a rivastigmine patch as unable to tolerate memantine and donepezil previously.  Much  improved today.   Elevated blood pressure. Not on antihypertensives as outpatient.  Currently on as needed metoprolol.   History of recurrent DVT Heterozygote factor 5 Leiden mutation Patient with prior history of DVT  in the 1990s and subsequent 07/2013 after knee replacement was thought to be provoked.  Records note patient was about to complete her course for Xarelto when found to have extension of prior DVT requiring hospitalization 10/2013.  Questioned possible failure of Xarelto.  She was switched to Coumadin which she continued on Xarelto until 06/2014 when it was discontinued by her primary.  Spoke with the patient's husband at bedside at length on 10/03/2023.Marland Kitchen  Patient is currently not on anticoagulation for some time now.  No plans for initiating anticoagulation.  Patient was on aspirin as outpatient.  Will need to follow-up with PCP   Hypokalemia Improved after replacement.  Latest potassium of 4.1.   Acute kidney injury Improved.  Initial creatinine up to 1.2.  Creatinine at 0.8.  Improved with IV fluids.      Deconditioning debility.  Seen by PT and recommended skilled nursing facility placement.   DVT prophylaxis: enoxaparin (LOVENOX) injection 40 mg Start: 09/30/23 1000 SCDs Start: 09/30/23 0242   Code Status:     Code Status: Full Code  Disposition: Likely to skilled nursing facility.  As per primary team.  Status is: Inpatient   Family Communication: Spoke son on the phone at bedside on 10/03/2023.   Procedures:   Status post laparoscopic Cheree Ditto patch of perforated duodenal bulb ulcer by Dr. Dossie Der on 09/30/23.  Antimicrobials:  Fluconazole and Zosyn.  Anti-infectives (From admission, onward)    Start     Dose/Rate Route Frequency Ordered Stop   10/01/23 0400  fluconazole (DIFLUCAN) IVPB 400 mg        400 mg 100 mL/hr over 120 Minutes Intravenous Every 24 hours 09/30/23 0303 10/05/23 0921   09/30/23 0600  piperacillin-tazobactam (ZOSYN) IVPB 3.375 g         3.375 g 12.5 mL/hr over 240 Minutes Intravenous Every 8 hours 09/30/23 0246 10/05/23 0921   09/30/23 0400  fluconazole (DIFLUCAN) IVPB 400 mg        400 mg 100 mL/hr over 120 Minutes Intravenous Every 2 hours 09/30/23 0303 09/30/23 0754   09/29/23 2134  ceFEPIme (MAXIPIME) 1 g injection       Note to Pharmacy: Samantha Ali: cabinet override      09/29/23 2134 09/30/23 0944   09/29/23 2130  ceFEPIme (MAXIPIME) 1 g in sodium chloride 0.9 % 100 mL IVPB        1 g 200 mL/hr over 30 Minutes Intravenous  Once 09/29/23 2119 09/29/23 2218   09/29/23 2130  metroNIDAZOLE (FLAGYL) IVPB 500 mg        500 mg 100 mL/hr over 60 Minutes Intravenous  Once 09/29/23 2119 09/29/23 2315       Subjective: Today, patient was seen and examined at bedside.  Communicative and interactive today.  Denies any nausea vomiting pain or shortness of breath.  Has been tolerating oral diet.  Has had a bowel movement.  Objective: Vitals:   10/03/23 2313 10/04/23 0320 10/04/23 0442 10/04/23 0817  BP: 134/75 (!) 150/71  (!) 156/98  Pulse: 78 73    Resp: 18 20  20   Temp: 98.6 F (37 C) 98 F (36.7 C)  98.6 F (37 C)  TempSrc: Oral Oral  Oral  SpO2: 95% 95%    Weight:   60.8 kg   Height:        Intake/Output Summary (Last 24 hours) at 10/04/2023 1145 Last data filed at 10/04/2023 0400 Gross per 24 hour  Intake 903.77 ml  Output --  Net 903.77 ml   Filed Weights   10/02/23 0500 10/03/23 0500 10/04/23 0442  Weight: 60.3 kg 60.8 kg 60.8 kg    Physical Examination: Body mass index is 23.74 kg/m.   General:  Average built, not in obvious distress, Communicative, oriented to place in time. HENT:   No scleral pallor or icterus noted. Oral mucosa is moist.  Chest:    Diminished breath sounds bilaterally. No crackles or wheezes.  CVS: S1 &S2 heard. No murmur.  Regular rate and rhythm. Abdomen: Soft, nondistended, incision clean dry and intact.  JP drain in place.   Extremities: No cyanosis, clubbing or edema.   Peripheral pulses are palpable.  Right upper extremity PICC line in place. Psych: Alert, awake and Communicative, disoriented  CNS:  No cranial nerve deficits.  Moving all extremities. Skin: Warm and dry.  No rashes noted.  Data Reviewed:   CBC: Recent Labs  Lab 09/30/23 0541 10/01/23 0535 10/02/23 0503 10/03/23 0500 10/04/23 0909  WBC 8.7 13.2* 17.0* 9.2 8.2  HGB 11.4* 12.1 12.7 11.3* 10.8*  HCT 34.1* 36.6 37.2 33.8* 33.3*  MCV 91.2 90.8 88.8 95.5 96.2  PLT 257 267 292 254 260    Basic Metabolic Panel: Recent Labs  Lab 09/29/23 1751 09/30/23 0330 09/30/23 0345 10/01/23 0535 10/02/23 0503 10/03/23 0813  NA 135 138 136 136 136 143  K 3.7 3.8  3.8 3.4* 3.3* 4.1  CL 98 107  --  108 103 106  CO2 25 22  --  23 25 25   GLUCOSE 126* 164*  --  137* 131* 118*  BUN 33* 18  --  11 11 19   CREATININE 1.20* 0.72  --  0.76 0.73 0.82  CALCIUM 10.1 8.7*  --  7.9* 8.7* 9.4  MG  --  1.7  --  2.0 2.0 2.1  PHOS  --  3.9  --  2.1*  --   --     Liver Function Tests: Recent Labs  Lab 09/29/23 1751 10/01/23 0535  AST 11* 16  ALT 10 18  ALKPHOS 101 64  BILITOT 0.6 0.5  PROT 7.3 5.6*  ALBUMIN 4.6 2.8*     Radiology Studies: DG UGI W SINGLE CM (SOL OR THIN BA) Result Date: 10/02/2023 CLINICAL DATA:  78 year old female with anterior duodenal bulb perforation status post surgical repair on 09/30/2023. Request for water-soluble UGI. EXAM: DG UGI W SINGLE CM TECHNIQUE: Scout radiograph was obtained. Single contrast examination was performed using water-soluble contrast. This exam was performed by Lawernce Ion, PA-C, and was supervised and interpreted by Roanna Banning, MD. FLUOROSCOPY: Radiation Exposure Index (as provided by the fluoroscopic device): 30.7 mGy Kerma COMPARISON:  CT AP, 09/29/2023.  KUB, 09/30/2023. FINDINGS: Scout Radiograph: Nonspecific gas pattern. Esophagus:  Limited exam showed smooth contour of the esophagus. Esophageal motility:  Not examined. Gastroesophageal reflux:  Not  examined. Ingested 13mm barium tablet:  Not given. Stomach: Limited exam showed smooth contour of the stomach. Large hiatal hernia. Gastric emptying: Normal. Duodenum: No postoperative leak noted from the duodenal bulb. Other:  Thoracolumbar scoliosis. IMPRESSION: 1. No fluoroscopic evidence of postoperative leak at the duodenal bulb. 2.  Large hiatal hernia. Electronically Signed   By: Roanna Banning M.D.   On: 10/02/2023 14:20      LOS: 4 days    Joycelyn Das, MD [facility] Available via Epic secure chat 7am-7pm After these hours, please refer to coverage provider listed on [email or web address] 10/04/2023, 11:45 AM

## 2023-10-04 NOTE — Plan of Care (Signed)
 Problem: Education: Goal: Knowledge of General Education information will improve Description: Including pain rating scale, medication(s)/side effects and non-pharmacologic comfort measures 10/04/2023 0106 by Carlene Coria, RN Outcome: Progressing 10/03/2023 2118 by Carlene Coria, RN Outcome: Progressing   Problem: Health Behavior/Discharge Planning: Goal: Ability to manage health-related needs will improve 10/04/2023 0106 by Carlene Coria, RN Outcome: Progressing 10/03/2023 2118 by Carlene Coria, RN Outcome: Progressing   Problem: Clinical Measurements: Goal: Ability to maintain clinical measurements within normal limits will improve 10/04/2023 0106 by Carlene Coria, RN Outcome: Progressing 10/03/2023 2118 by Carlene Coria, RN Outcome: Progressing Goal: Will remain free from infection 10/04/2023 0106 by Carlene Coria, RN Outcome: Progressing 10/03/2023 2118 by Carlene Coria, RN Outcome: Progressing Goal: Diagnostic test results will improve 10/04/2023 0106 by Carlene Coria, RN Outcome: Progressing 10/03/2023 2118 by Carlene Coria, RN Outcome: Progressing Goal: Respiratory complications will improve 10/04/2023 0106 by Carlene Coria, RN Outcome: Progressing 10/03/2023 2118 by Carlene Coria, RN Outcome: Progressing Goal: Cardiovascular complication will be avoided 10/04/2023 0106 by Carlene Coria, RN Outcome: Progressing 10/03/2023 2118 by Carlene Coria, RN Outcome: Progressing   Problem: Activity: Goal: Risk for activity intolerance will decrease 10/04/2023 0106 by Carlene Coria, RN Outcome: Progressing 10/03/2023 2118 by Carlene Coria, RN Outcome: Progressing   Problem: Nutrition: Goal: Adequate nutrition will be maintained 10/04/2023 0106 by Carlene Coria, RN Outcome: Progressing 10/03/2023 2118 by Carlene Coria, RN Outcome: Progressing   Problem: Coping: Goal: Level of anxiety will decrease 10/04/2023  0106 by Carlene Coria, RN Outcome: Progressing 10/03/2023 2118 by Carlene Coria, RN Outcome: Progressing   Problem: Elimination: Goal: Will not experience complications related to bowel motility 10/04/2023 0106 by Carlene Coria, RN Outcome: Progressing 10/03/2023 2118 by Carlene Coria, RN Outcome: Progressing Goal: Will not experience complications related to urinary retention 10/04/2023 0106 by Carlene Coria, RN Outcome: Progressing 10/03/2023 2118 by Carlene Coria, RN Outcome: Progressing   Problem: Pain Managment: Goal: General experience of comfort will improve and/or be controlled 10/04/2023 0106 by Carlene Coria, RN Outcome: Progressing 10/03/2023 2118 by Carlene Coria, RN Outcome: Progressing   Problem: Safety: Goal: Ability to remain free from injury will improve 10/04/2023 0106 by Carlene Coria, RN Outcome: Progressing 10/03/2023 2118 by Carlene Coria, RN Outcome: Progressing   Problem: Skin Integrity: Goal: Risk for impaired skin integrity will decrease 10/04/2023 0106 by Carlene Coria, RN Outcome: Progressing 10/03/2023 2118 by Carlene Coria, RN Outcome: Progressing   Problem: Activity: Goal: Ability to tolerate increased activity will improve 10/04/2023 0106 by Carlene Coria, RN Outcome: Progressing 10/03/2023 2118 by Carlene Coria, RN Outcome: Progressing   Problem: Respiratory: Goal: Ability to maintain a clear airway and adequate ventilation will improve 10/04/2023 0106 by Carlene Coria, RN Outcome: Progressing 10/03/2023 2118 by Carlene Coria, RN Outcome: Progressing   Problem: Role Relationship: Goal: Method of communication will improve 10/04/2023 0106 by Carlene Coria, RN Outcome: Progressing 10/03/2023 2118 by Carlene Coria, RN Outcome: Progressing   Problem: Safety: Goal: Non-violent Restraint(s) 10/04/2023 0106 by Carlene Coria, RN Outcome: Progressing 10/03/2023 2118 by  Carlene Coria, RN Outcome: Progressing   Problem: Education: Goal: Ability to describe self-care measures that may prevent or decrease complications (Diabetes Survival Skills Education) will improve 10/04/2023 0106 by Carlene Coria, RN Outcome: Progressing 10/03/2023 2118 by Carlene Coria, RN Outcome: Progressing Goal: Individualized  Educational Video(s) 10/04/2023 0106 by Carlene Coria, RN Outcome: Progressing 10/03/2023 2118 by Carlene Coria, RN Outcome: Progressing   Problem: Coping: Goal: Ability to adjust to condition or change in health will improve 10/04/2023 0106 by Carlene Coria, RN Outcome: Progressing 10/03/2023 2118 by Carlene Coria, RN Outcome: Progressing   Problem: Fluid Volume: Goal: Ability to maintain a balanced intake and output will improve 10/04/2023 0106 by Carlene Coria, RN Outcome: Progressing 10/03/2023 2118 by Carlene Coria, RN Outcome: Progressing   Problem: Health Behavior/Discharge Planning: Goal: Ability to identify and utilize available resources and services will improve 10/04/2023 0106 by Carlene Coria, RN Outcome: Progressing 10/03/2023 2118 by Carlene Coria, RN Outcome: Progressing Goal: Ability to manage health-related needs will improve 10/04/2023 0106 by Carlene Coria, RN Outcome: Progressing 10/03/2023 2118 by Carlene Coria, RN Outcome: Progressing   Problem: Metabolic: Goal: Ability to maintain appropriate glucose levels will improve 10/04/2023 0106 by Carlene Coria, RN Outcome: Progressing 10/03/2023 2118 by Carlene Coria, RN Outcome: Progressing   Problem: Nutritional: Goal: Maintenance of adequate nutrition will improve 10/04/2023 0106 by Carlene Coria, RN Outcome: Progressing 10/03/2023 2118 by Carlene Coria, RN Outcome: Progressing Goal: Progress toward achieving an optimal weight will improve 10/04/2023 0106 by Carlene Coria, RN Outcome: Progressing 10/03/2023  2118 by Carlene Coria, RN Outcome: Progressing   Problem: Skin Integrity: Goal: Risk for impaired skin integrity will decrease 10/04/2023 0106 by Carlene Coria, RN Outcome: Progressing 10/03/2023 2118 by Carlene Coria, RN Outcome: Progressing   Problem: Tissue Perfusion: Goal: Adequacy of tissue perfusion will improve 10/04/2023 0106 by Carlene Coria, RN Outcome: Progressing 10/03/2023 2118 by Carlene Coria, RN Outcome: Progressing

## 2023-10-05 DIAGNOSIS — Z9889 Other specified postprocedural states: Secondary | ICD-10-CM | POA: Diagnosis not present

## 2023-10-05 LAB — CBC
HCT: 32.6 % — ABNORMAL LOW (ref 36.0–46.0)
Hemoglobin: 10.8 g/dL — ABNORMAL LOW (ref 12.0–15.0)
MCH: 30.7 pg (ref 26.0–34.0)
MCHC: 33.1 g/dL (ref 30.0–36.0)
MCV: 92.6 fL (ref 80.0–100.0)
Platelets: 277 10*3/uL (ref 150–400)
RBC: 3.52 MIL/uL — ABNORMAL LOW (ref 3.87–5.11)
RDW: 14 % (ref 11.5–15.5)
WBC: 9.9 10*3/uL (ref 4.0–10.5)
nRBC: 0 % (ref 0.0–0.2)

## 2023-10-05 LAB — CULTURE, BLOOD (ROUTINE X 2)
Culture: NO GROWTH
Culture: NO GROWTH
Special Requests: ADEQUATE

## 2023-10-05 LAB — BASIC METABOLIC PANEL
Anion gap: 6 (ref 5–15)
BUN: 21 mg/dL (ref 8–23)
CO2: 26 mmol/L (ref 22–32)
Calcium: 8.8 mg/dL — ABNORMAL LOW (ref 8.9–10.3)
Chloride: 106 mmol/L (ref 98–111)
Creatinine, Ser: 1.03 mg/dL — ABNORMAL HIGH (ref 0.44–1.00)
GFR, Estimated: 56 mL/min — ABNORMAL LOW (ref >60)
Glucose, Bld: 96 mg/dL (ref 70–99)
Potassium: 3.9 mmol/L (ref 3.5–5.1)
Sodium: 138 mmol/L (ref 135–145)

## 2023-10-05 LAB — GLUCOSE, CAPILLARY
Glucose-Capillary: 99 mg/dL (ref 70–99)
Glucose-Capillary: 90 mg/dL (ref 70–99)
Glucose-Capillary: 85 mg/dL (ref 70–99)
Glucose-Capillary: 119 mg/dL — ABNORMAL HIGH (ref 70–99)

## 2023-10-05 LAB — MAGNESIUM: Magnesium: 2 mg/dL (ref 1.7–2.4)

## 2023-10-05 NOTE — Progress Notes (Signed)
 5 Days Post-Op  Subjective: No acute complaints. Denies nausea/vomiting. No abdominal pain. She is not sure what she ate yesterday.  Objective: Vital signs in last 24 hours: Temp:  [98.2 F (36.8 C)-98.7 F (37.1 C)] 98.6 F (37 C) (03/10 0807) Pulse Rate:  [74-89] 80 (03/10 0807) Resp:  [12-16] 12 (03/10 0807) BP: (129-152)/(74-79) 152/74 (03/10 0807) SpO2:  [96 %-97 %] 97 % (03/10 0807) Weight:  [60.8 kg] 60.8 kg (03/10 0500) Last BM Date : 10/04/23  Intake/Output from previous day: 03/09 0701 - 03/10 0700 In: 10 [I.V.:10] Out: -  Intake/Output this shift: No intake/output data recorded.  PE: Gen:  Alert, NAD, pleasant Pulm: normal work of breathing on room air Abd: Soft, nondistended, incisions clean and dry. JP with serous fluid. Neuro: alert, no focal deficitis  Lab Results:  Recent Labs    10/04/23 0909 10/05/23 0727  WBC 8.2 9.9  HGB 10.8* 10.8*  HCT 33.3* 32.6*  PLT 260 277   BMET Recent Labs    10/04/23 1155 10/05/23 0727  NA 136 138  K 4.1 3.9  CL 103 106  CO2 23 26  GLUCOSE 124* 96  BUN 19 21  CREATININE 0.75 1.03*  CALCIUM 9.1 8.8*   PT/INR No results for input(s): "LABPROT", "INR" in the last 72 hours. CMP     Component Value Date/Time   NA 138 10/05/2023 0727   NA 142 06/02/2022 1351   NA 143 05/30/2013 1040   K 3.9 10/05/2023 0727   K 4.6 05/30/2013 1040   CL 106 10/05/2023 0727   CL 105 05/05/2012 1058   CO2 26 10/05/2023 0727   CO2 27 05/30/2013 1040   GLUCOSE 96 10/05/2023 0727   GLUCOSE 98 05/30/2013 1040   GLUCOSE 93 05/05/2012 1058   BUN 21 10/05/2023 0727   BUN 9 06/02/2022 1351   BUN 14.4 05/30/2013 1040   CREATININE 1.03 (H) 10/05/2023 0727   CREATININE 1.1 05/30/2013 1040   CALCIUM 8.8 (L) 10/05/2023 0727   CALCIUM 10.1 05/30/2013 1040   PROT 5.6 (L) 10/01/2023 0535   PROT 7.1 06/02/2022 1351   PROT 7.7 05/30/2013 1040   ALBUMIN 2.8 (L) 10/01/2023 0535   ALBUMIN 4.8 06/02/2022 1351   ALBUMIN 3.8  05/30/2013 1040   AST 16 10/01/2023 0535   AST 26 05/30/2013 1040   ALT 18 10/01/2023 0535   ALT 22 05/30/2013 1040   ALKPHOS 64 10/01/2023 0535   ALKPHOS 88 05/30/2013 1040   BILITOT 0.5 10/01/2023 0535   BILITOT 0.5 06/02/2022 1351   BILITOT 0.59 05/30/2013 1040   GFRNONAA 56 (L) 10/05/2023 0727   GFRAA 72 (L) 11/15/2013 0630   Lipase     Component Value Date/Time   LIPASE 20 09/29/2023 1751    Studies/Results: No results found.       Assessment/Plan POD 5 s/p Laparoscopic graham patch of perforated duodenal bulb ulcer by Dr. Dossie Der on 09/30/23 - NGT accidentally pulled out 3/6 AM. Upper GI 3/7 with no evidence of leak. On soft diet. - Zosyn/fluconazole to stop today. - Remove JP drains - Avoid NSAIDs - H pylori stool antigen in process - Mobilize, PT - Delirium precautions, minimize narcotics - Appreciate TRH assist with underlying medical conditions  - BID PPI  FEN - Soft diet, BID PPI VTE - SCDs, lovenox ID - Zosyn/Fluconazole, day 4/5 Dispo: progressive care, PT recommending SNF at discharge. Need to discuss further when patient's husband arrives today. Medically the patient is stable for  discharge once dispo plans have been confirmed/arranged.  Hx DVT, factor V Leiden - discussed w/ pharm. She was not on medication for this pre-op. Discussed with TRH, patient is following with PCP for this.    LOS: 5 days    Fritzi Mandes, MD [facility] 10/05/2023, 9:09 AM Please see Amion for pager number during day hours 7:00am-4:30pm

## 2023-10-05 NOTE — Plan of Care (Signed)
 Skyah has been up walking 3 times today. Once with PT and 2x with Nursing. She has ambulated to the BR and back with assistance of a FWW and her spouse. VSS are stable, she complains of mild pain in her abdomen after activity which is relieved by her prescribed pain medication. Plan of care discussed with patient and spouse. Call light in reach will continue to monitor.

## 2023-10-05 NOTE — Progress Notes (Signed)
 Physical Therapy Treatment Patient Details Name: Samantha Ali MRN: [id number] DOB: 02/07/1946 Today's Date: 10/05/2023   History of Present Illness The pt is a 78 yo female presenting 3/4 with chest pain and lower abdominal pain, CT showed ruptured ulcer in intestine. S/P laparoscopic patch of perforated ulcer 3/5. PMH includes: arthritis, breast cancer, CKD, DVT, GERD, HLD, R TKA, and R rotator cuff surgery.    PT Comments  The pt was agreeable to session, is up ambulating in the room with her spouse upon arrival of PT. The pt was able to demo great improvement in mobility today, ambulating ~400 ft with CGA and progressed from use of RW to hallway rail (to mimic cane). The pt did require cues for wayfinding, posture, positioning in RW, and safety, as well as light assist to complete sit-stand transfers. Therefore, continue to recommend skilled rehab <3hours/day after d/c to facilitate maximal return of independence, strength, stability, and mobility prior to return home with spouse where she will have to be on her own at times while he is at work.    If plan is discharge home, recommend the following: Assistance with cooking/housework;Direct supervision/assist for medications management;Direct supervision/assist for financial management;Assist for transportation;Help with stairs or ramp for entrance;Supervision due to cognitive status;A little help with walking and/or transfers;A little help with bathing/dressing/bathroom   Can travel by private vehicle     Yes  Equipment Recommendations  None recommended by PT    Recommendations for Other Services       Precautions / Restrictions Precautions Precautions: Fall Recall of Precautions/Restrictions: Impaired Precaution/Restrictions Comments: abdominal precautions, JP drain RLQ Restrictions Weight Bearing Restrictions Per Provider Order: No     Mobility  Bed Mobility Overal bed mobility: Needs Assistance             General bed  mobility comments: pt standing in bathroom with spouse upon arrival    Transfers Overall transfer level: Needs assistance Equipment used: Rolling walker (2 wheels) Transfers: Sit to/from Stand Sit to Stand: Contact guard assist           General transfer comment: CGA with cues for hand placement    Ambulation/Gait Ambulation/Gait assistance: Contact guard assist Gait Distance (Feet): 400 Feet Assistive device: Rolling walker (2 wheels), 1 person hand held assist (hallway rail) Gait Pattern/deviations: Trunk flexed Gait velocity: decreased     General Gait Details: pt with narrow BOS and heald/neck flexed until directly cued. progressed from use of RW to hallway rail to mimic cane.     Balance Overall balance assessment: Needs assistance Sitting-balance support: Feet supported Sitting balance-Leahy Scale: Good Sitting balance - Comments: CGA to supwervision   Standing balance support: No upper extremity supported, During functional activity, Single extremity supported Standing balance-Leahy Scale: Fair Standing balance comment: can static stand without UE support, benefits from at least single UE support for gait                            Communication Communication Communication: No apparent difficulties  Cognition Arousal: Alert Behavior During Therapy: WFL for tasks assessed/performed   PT - Cognitive impairments: History of cognitive impairments, Awareness, Orientation, Problem solving, Safety/Judgement   Orientation impairments: Place, Time, Situation (pt unsure where she is, year or month, and did not know why she was at hospital)                   PT - Cognition Comments: pt knew name and  day of week, unsure of location, year, or month. following simple commands well Following commands: Impaired Following commands impaired: Follows one step commands inconsistently, Follows one step commands with increased time    Cueing Cueing Techniques:  Verbal cues, Tactile cues  Exercises      General Comments General comments (skin integrity, edema, etc.): VSS on RA      Pertinent Vitals/Pain Pain Assessment Pain Assessment: No/denies pain     PT Goals (current goals can now be found in the care plan section) Acute Rehab PT Goals Patient Stated Goal: to be able to be home alone while spouse at work - per spouse PT Goal Formulation: With patient/family Time For Goal Achievement: 10/15/23 Potential to Achieve Goals: Good Progress towards PT goals: Progressing toward goals    Frequency    Min 1X/week       AM-PAC PT "6 Clicks" Mobility   Outcome Measure  Help needed turning from your back to your side while in a flat bed without using bedrails?: A Little Help needed moving from lying on your back to sitting on the side of a flat bed without using bedrails?: A Little Help needed moving to and from a bed to a chair (including a wheelchair)?: A Little Help needed standing up from a chair using your arms (e.g., wheelchair or bedside chair)?: A Little Help needed to walk in hospital room?: A Little Help needed climbing 3-5 steps with a railing? : A Little 6 Click Score: 18    End of Session Equipment Utilized During Treatment: Gait belt Activity Tolerance: Patient tolerated treatment well Patient left: with call bell/phone within reach;with family/visitor present;in chair;with chair alarm set Nurse Communication: Mobility status PT Visit Diagnosis: Unsteadiness on feet (R26.81);Other abnormalities of gait and mobility (R26.89)     Time: 8295-6213 PT Time Calculation (min) (ACUTE ONLY): 30 min  Charges:    $Gait Training: 8-22 mins $Therapeutic Activity: 8-22 mins PT General Charges $$ ACUTE PT VISIT: 1 Visit                     Vickki Muff, PT, DPT   Acute Rehabilitation Department Office [phone] Secure Chat Communication Preferred   Ronnie Derby 10/05/2023, 2:31 PM

## 2023-10-05 NOTE — Progress Notes (Signed)
 PROGRESS NOTE    Samantha Ali  GNF:[id number] DOB: [birth year] DOA: 09/29/2023 PCP: Sigmund Hazel, MD    Brief Narrative:   Samantha Ali is a 78 y.o. female with past medical history of hyperlipidemia, CKD, history of DVT, factor V Leiden mutation, history of breast cancer, and osteoporosis initially presented to the hospital with acute on abdomen on 09/29/2023.  CT scan done showed perforated duodenal versus gastric ulcer and patient underwent emergent diagnostic laparotomy on 3 5.  There was note of perforation in the anterior portion of duodenal bulb with purulent peritonitis, status post laparoscopic repair and JP drain was placed and patient was transferred to the ICU l intubated.  Patient was started on empiric antibiotics of Zosyn and fluconazole.  Subsequently patient was transferred out of the ICU.  Patient was noted to be agitated and had pulled out NG tube and was noted to have fever tachycardia, tachypnea and elevated blood pressure so medical team was consulted for further evaluation and treatment.     Assessment and Plan:  Sepsis secondary to duodenal bulb perforation s/p laparoscopic repair Peritonitis secondary to perforation  Status post laparoscopic Cheree Ditto patch of perforated duodenal bulb ulcer by Dr. Dossie Der on 09/30/23.  Blood cultures negative in 5 days.  Completed IV Zosyn and fluconazole.  Leukocytosis has normalized x3,  Temperature max of 98.82F.    Patient underwent upper GI study 3/7 with no evidence of postoperative leak at the duodenal bulb.  Large hiatal hernia..  Tolerating oral diet.  Off TPN.  Acute hypoxic respiratory failure Resolved.  On room air.  Initially was intubated in the ICU.  Delirium Likely hospital induced delirium in the background of mild to moderate dementia as per neurology in the past.   Treated with a rivastigmine patch as unable to tolerate memantine and donepezil previously.  Remained stable.  Communicative.   Elevated blood pressure. Not  on antihypertensives as outpatient.  Currently on as needed metoprolol.   History of recurrent DVT Heterozygote factor 5 Leiden mutation Patient with prior history of DVT  in the 1990s and subsequent 07/2013 after knee replacement was thought to be provoked.    Patient is currently not on anticoagulation for some time now likely from her PCP.Marland Kitchen  No plans for initiating anticoagulation.  Patient was on aspirin as outpatient.  Will need to follow-up with PCP   Hypokalemia Improved after replacement.  Latest potassium of 3.9.   Acute kidney injury Improved.  Initial creatinine up to 1.2.  Creatinine of 1.0 at this time.  Improved with IV fluids.    Deconditioning debility.  Seen by PT and recommended skilled nursing facility placement.   DVT prophylaxis: enoxaparin (LOVENOX) injection 40 mg Start: 09/30/23 1000 SCDs Start: 09/30/23 0242   Code Status:     Code Status: Full Code  Disposition:  skilled nursing facility.  As per primary team.  Status is: Inpatient   Family Communication: Spoke son on the phone at bedside on 10/03/2023.   Procedures:   Status post laparoscopic Cheree Ditto patch of perforated duodenal bulb ulcer by Dr. Dossie Der on 09/30/23.  Antimicrobials:  Fluconazole and Zosyn-completed.  Anti-infectives (From admission, onward)    Start     Dose/Rate Route Frequency Ordered Stop   10/01/23 0400  fluconazole (DIFLUCAN) IVPB 400 mg        400 mg 100 mL/hr over 120 Minutes Intravenous Every 24 hours 09/30/23 0303 10/05/23 0710   09/30/23 0600  piperacillin-tazobactam (ZOSYN) IVPB 3.375 g  3.375 g 12.5 mL/hr over 240 Minutes Intravenous Every 8 hours 09/30/23 0246 10/05/23 0921   09/30/23 0400  fluconazole (DIFLUCAN) IVPB 400 mg        400 mg 100 mL/hr over 120 Minutes Intravenous Every 2 hours 09/30/23 0303 09/30/23 0754   09/29/23 2134  ceFEPIme (MAXIPIME) 1 g injection       Note to Pharmacy: Schuyler Amor: cabinet override      09/29/23 2134 09/30/23 0944    09/29/23 2130  ceFEPIme (MAXIPIME) 1 g in sodium chloride 0.9 % 100 mL IVPB        1 g 200 mL/hr over 30 Minutes Intravenous  Once 09/29/23 2119 09/29/23 2218   09/29/23 2130  metroNIDAZOLE (FLAGYL) IVPB 500 mg        500 mg 100 mL/hr over 60 Minutes Intravenous  Once 09/29/23 2119 09/29/23 2315       Subjective: Today, patient was seen and examined at bedside.  Denies interval complaints.  Tolerating oral diet.  No abdominal pain nausea or vomiting.  Objective: Vitals:   10/04/23 2317 10/05/23 0319 10/05/23 0500 10/05/23 0807  BP: 129/77 135/79  (!) 152/74  Pulse: 89 74  80  Resp: 16 16  12   Temp: 98.4 F (36.9 C) 98.2 F (36.8 C)  98.6 F (37 C)  TempSrc: Oral Oral  Oral  SpO2: 96%   97%  Weight:   60.8 kg   Height:        Intake/Output Summary (Last 24 hours) at 10/05/2023 1058 Last data filed at 10/04/2023 2119 Gross per 24 hour  Intake 10 ml  Output --  Net 10 ml   Filed Weights   10/03/23 0500 10/04/23 0442 10/05/23 0500  Weight: 60.8 kg 60.8 kg 60.8 kg    Physical Examination: Body mass index is 23.74 kg/m.   General:  Average built, not in obvious distress, Communicative, oriented to place and place. HENT:   No scleral pallor or icterus noted. Oral mucosa is moist.  Chest:    Diminished breath sounds bilaterally. No crackles or wheezes.  CVS: S1 &S2 heard. No murmur.  Regular rate and rhythm. Abdomen: Soft,  JP drain in place.   Extremities: No cyanosis, clubbing or edema.  Peripheral pulses are palpable.  Right upper extremity PICC line in place. Psych: Alert, awake and Communicative, disoriented  CNS:  No cranial nerve deficits.  Moving all extremities. Skin: Warm and dry.  No rashes noted.  Data Reviewed:   CBC: Recent Labs  Lab 10/01/23 0535 10/02/23 0503 10/03/23 0500 10/04/23 0909 10/05/23 0727  WBC 13.2* 17.0* 9.2 8.2 9.9  HGB 12.1 12.7 11.3* 10.8* 10.8*  HCT 36.6 37.2 33.8* 33.3* 32.6*  MCV 90.8 88.8 95.5 96.2 92.6  PLT 267 292 254  260 277    Basic Metabolic Panel: Recent Labs  Lab 09/30/23 0330 09/30/23 0345 10/01/23 0535 10/02/23 0503 10/03/23 0813 10/04/23 1155 10/05/23 0727  NA 138   < > 136 136 143 136 138  K 3.8   < > 3.4* 3.3* 4.1 4.1 3.9  CL 107  --  108 103 106 103 106  CO2 22  --  23 25 25 23 26   GLUCOSE 164*  --  137* 131* 118* 124* 96  BUN 18  --  11 11 19 19 21   CREATININE 0.72  --  0.76 0.73 0.82 0.75 1.03*  CALCIUM 8.7*  --  7.9* 8.7* 9.4 9.1 8.8*  MG 1.7  --  2.0 2.0 2.1  2.1 2.0  PHOS 3.9  --  2.1*  --   --   --   --    < > = values in this interval not displayed.    Liver Function Tests: Recent Labs  Lab 09/29/23 1751 10/01/23 0535  AST 11* 16  ALT 10 18  ALKPHOS 101 64  BILITOT 0.6 0.5  PROT 7.3 5.6*  ALBUMIN 4.6 2.8*     Radiology Studies: No results found.     LOS: 5 days    Joycelyn Das, MD [facility] Available via Epic secure chat 7am-7pm After these hours, please refer to coverage provider listed on [email or web address] 10/05/2023, 10:58 AM

## 2023-10-05 NOTE — TOC Progression Note (Signed)
 Transition of Care [facility]) - Progression Note    Patient Details  Name: Samantha Ali MRN: [id number] Date of Birth: [birth year]  Transition of Care [facility] CM/SW Contact  Eduard Roux, [place] Phone Number: 10/05/2023, 5:38 PM  Clinical Narrative:     CSW contacted patient's spouse- he chose [facility], however, that is not one of the facilities that has offered. Sent message to [facility], request they review the referral - CSW waiting on response   Antony Blackbird, MSW, LCSW Clinical Social Worker    Expected Discharge Plan: Skilled Nursing Facility Barriers to Discharge: Continued Medical Work up  Expected Discharge Plan and Services       Living arrangements for the past 2 months: Single Family Home                                       Social Determinants of Health (SDOH) Interventions SDOH Screenings   Food Insecurity: No Food Insecurity (09/30/2023)  Housing: Low Risk  (09/30/2023)  Transportation Needs: No Transportation Needs (09/30/2023)  Utilities: Not At Risk (09/30/2023)  Social Connections: Unknown (09/30/2023)  Tobacco Use: Low Risk  (09/28/2023)    Readmission Risk Interventions     No data to display

## 2023-10-06 DIAGNOSIS — Z9889 Other specified postprocedural states: Secondary | ICD-10-CM | POA: Diagnosis not present

## 2023-10-06 LAB — GLUCOSE, CAPILLARY
Glucose-Capillary: 101 mg/dL — ABNORMAL HIGH (ref 70–99)
Glucose-Capillary: 108 mg/dL — ABNORMAL HIGH (ref 70–99)
Glucose-Capillary: 108 mg/dL — ABNORMAL HIGH (ref 70–99)

## 2023-10-06 LAB — H. PYLORI ANTIGEN, STOOL: H. Pylori Stool Ag, Eia: NEGATIVE

## 2023-10-06 MED ORDER — PROSOURCE PLUS PO LIQD
30.0000 mL | Freq: Two times a day (BID) | ORAL | Status: DC
  Administered 2023-10-06 – 2023-10-07 (×3): 30 mL via ORAL
  Filled 2023-10-06 (×4): qty 30

## 2023-10-06 MED ORDER — ENSURE ENLIVE PO LIQD: 237.0000 mL | Freq: Two times a day (BID) | ORAL | Status: DC

## 2023-10-06 MED ORDER — ACETAMINOPHEN 500 MG PO TABS: 1000.0000 mg | ORAL_TABLET | Freq: Four times a day (QID) | ORAL | Status: AC | PRN

## 2023-10-06 MED ORDER — AMLODIPINE BESYLATE 5 MG PO TABS
5.0000 mg | ORAL_TABLET | Freq: Every day | ORAL | Status: DC
  Administered 2023-10-06 – 2023-10-07 (×2): 5 mg via ORAL
  Filled 2023-10-06 (×2): qty 1

## 2023-10-06 MED ORDER — BOOST / RESOURCE BREEZE PO LIQD CUSTOM
1.0000 | Freq: Three times a day (TID) | ORAL | Status: DC
  Administered 2023-10-06 (×2): 1 via ORAL

## 2023-10-06 MED ORDER — AMLODIPINE BESYLATE 5 MG PO TABS: 5.0000 mg | ORAL_TABLET | Freq: Every day | ORAL | Status: AC

## 2023-10-06 MED ORDER — ENSURE ENLIVE PO LIQD
237.0000 mL | Freq: Two times a day (BID) | ORAL | Status: DC
  Administered 2023-10-06 – 2023-10-07 (×3): 237 mL via ORAL

## 2023-10-06 MED ORDER — OXYCODONE HCL 5 MG PO TABS
2.5000 mg | ORAL_TABLET | ORAL | 0 refills | Status: DC | PRN
Start: 2023-10-06 — End: 2024-05-19

## 2023-10-06 MED ORDER — METHOCARBAMOL 500 MG PO TABS: 500.0000 mg | ORAL_TABLET | Freq: Four times a day (QID) | ORAL | Status: DC | PRN

## 2023-10-06 MED ORDER — PANTOPRAZOLE SODIUM 40 MG PO TBEC: 40.0000 mg | DELAYED_RELEASE_TABLET | Freq: Two times a day (BID) | ORAL | Status: DC

## 2023-10-06 NOTE — Plan of Care (Signed)
 Problem: Education: Goal: Knowledge of General Education information will improve Description: Including pain rating scale, medication(s)/side effects and non-pharmacologic comfort measures 10/06/2023 2333 by Carlene Coria, RN Outcome: Progressing 10/06/2023 2037 by Carlene Coria, RN Outcome: Progressing   Problem: Health Behavior/Discharge Planning: Goal: Ability to manage health-related needs will improve 10/06/2023 2333 by Carlene Coria, RN Outcome: Progressing 10/06/2023 2037 by Carlene Coria, RN Outcome: Progressing   Problem: Clinical Measurements: Goal: Ability to maintain clinical measurements within normal limits will improve 10/06/2023 2333 by Carlene Coria, RN Outcome: Progressing 10/06/2023 2037 by Carlene Coria, RN Outcome: Progressing Goal: Will remain free from infection 10/06/2023 2333 by Carlene Coria, RN Outcome: Progressing 10/06/2023 2037 by Carlene Coria, RN Outcome: Progressing Goal: Diagnostic test results will improve 10/06/2023 2333 by Carlene Coria, RN Outcome: Progressing 10/06/2023 2037 by Carlene Coria, RN Outcome: Progressing Goal: Respiratory complications will improve 10/06/2023 2333 by Carlene Coria, RN Outcome: Progressing 10/06/2023 2037 by Carlene Coria, RN Outcome: Progressing Goal: Cardiovascular complication will be avoided 10/06/2023 2333 by Carlene Coria, RN Outcome: Progressing 10/06/2023 2037 by Carlene Coria, RN Outcome: Progressing   Problem: Activity: Goal: Risk for activity intolerance will decrease 10/06/2023 2333 by Carlene Coria, RN Outcome: Progressing 10/06/2023 2037 by Carlene Coria, RN Outcome: Progressing   Problem: Nutrition: Goal: Adequate nutrition will be maintained 10/06/2023 2333 by Carlene Coria, RN Outcome: Progressing 10/06/2023 2037 by Carlene Coria, RN Outcome: Progressing   Problem: Coping: Goal: Level of anxiety will  decrease 10/06/2023 2333 by Carlene Coria, RN Outcome: Progressing 10/06/2023 2037 by Carlene Coria, RN Outcome: Progressing   Problem: Elimination: Goal: Will not experience complications related to bowel motility 10/06/2023 2333 by Carlene Coria, RN Outcome: Progressing 10/06/2023 2037 by Carlene Coria, RN Outcome: Progressing Goal: Will not experience complications related to urinary retention 10/06/2023 2333 by Carlene Coria, RN Outcome: Progressing 10/06/2023 2037 by Carlene Coria, RN Outcome: Progressing   Problem: Pain Managment: Goal: General experience of comfort will improve and/or be controlled 10/06/2023 2333 by Carlene Coria, RN Outcome: Progressing 10/06/2023 2037 by Carlene Coria, RN Outcome: Progressing   Problem: Safety: Goal: Ability to remain free from injury will improve 10/06/2023 2333 by Carlene Coria, RN Outcome: Progressing 10/06/2023 2037 by Carlene Coria, RN Outcome: Progressing   Problem: Skin Integrity: Goal: Risk for impaired skin integrity will decrease 10/06/2023 2333 by Carlene Coria, RN Outcome: Progressing 10/06/2023 2037 by Carlene Coria, RN Outcome: Progressing   Problem: Activity: Goal: Ability to tolerate increased activity will improve 10/06/2023 2333 by Carlene Coria, RN Outcome: Progressing 10/06/2023 2037 by Carlene Coria, RN Outcome: Progressing   Problem: Respiratory: Goal: Ability to maintain a clear airway and adequate ventilation will improve 10/06/2023 2333 by Carlene Coria, RN Outcome: Progressing 10/06/2023 2037 by Carlene Coria, RN Outcome: Progressing   Problem: Role Relationship: Goal: Method of communication will improve 10/06/2023 2333 by Carlene Coria, RN Outcome: Progressing 10/06/2023 2037 by Carlene Coria, RN Outcome: Progressing   Problem: Safety: Goal: Non-violent Restraint(s) 10/06/2023 2333 by Carlene Coria,  RN Outcome: Progressing 10/06/2023 2037 by Carlene Coria, RN Outcome: Progressing   Problem: Education: Goal: Ability to describe self-care measures that may prevent or decrease complications (Diabetes Survival Skills Education) will improve 10/06/2023 2333 by Carlene Coria, RN Outcome: Progressing 10/06/2023 2037 by Carlene Coria, RN Outcome: Progressing Goal: Individualized  Educational Video(s) 10/06/2023 2333 by Carlene Coria, RN Outcome: Progressing 10/06/2023 2037 by Carlene Coria, RN Outcome: Progressing   Problem: Coping: Goal: Ability to adjust to condition or change in health will improve 10/06/2023 2333 by Carlene Coria, RN Outcome: Progressing 10/06/2023 2037 by Carlene Coria, RN Outcome: Progressing   Problem: Fluid Volume: Goal: Ability to maintain a balanced intake and output will improve 10/06/2023 2333 by Carlene Coria, RN Outcome: Progressing 10/06/2023 2037 by Carlene Coria, RN Outcome: Progressing   Problem: Health Behavior/Discharge Planning: Goal: Ability to identify and utilize available resources and services will improve 10/06/2023 2333 by Carlene Coria, RN Outcome: Progressing 10/06/2023 2037 by Carlene Coria, RN Outcome: Progressing Goal: Ability to manage health-related needs will improve 10/06/2023 2333 by Carlene Coria, RN Outcome: Progressing 10/06/2023 2037 by Carlene Coria, RN Outcome: Progressing   Problem: Metabolic: Goal: Ability to maintain appropriate glucose levels will improve 10/06/2023 2333 by Carlene Coria, RN Outcome: Progressing 10/06/2023 2037 by Carlene Coria, RN Outcome: Progressing   Problem: Nutritional: Goal: Maintenance of adequate nutrition will improve 10/06/2023 2333 by Carlene Coria, RN Outcome: Progressing 10/06/2023 2037 by Carlene Coria, RN Outcome: Progressing Goal: Progress toward achieving an optimal weight will improve 10/06/2023  2333 by Carlene Coria, RN Outcome: Progressing 10/06/2023 2037 by Carlene Coria, RN Outcome: Progressing   Problem: Skin Integrity: Goal: Risk for impaired skin integrity will decrease 10/06/2023 2333 by Carlene Coria, RN Outcome: Progressing 10/06/2023 2037 by Carlene Coria, RN Outcome: Progressing   Problem: Tissue Perfusion: Goal: Adequacy of tissue perfusion will improve 10/06/2023 2333 by Carlene Coria, RN Outcome: Progressing 10/06/2023 2037 by Carlene Coria, RN Outcome: Progressing

## 2023-10-06 NOTE — Progress Notes (Incomplete)
 Nutrition Follow-up  DOCUMENTATION CODES:   Non-severe (moderate) malnutrition in context of acute illness/injury  INTERVENTION:  .prosource  Bbost breeze pt does not like ensure    NUTRITION DIAGNOSIS:   Moderate Malnutrition related to acute illness as evidenced by mild fat depletion, mild muscle depletion.  ***  GOAL:   Patient will meet greater than or equal to 90% of their needs  ***  MONITOR:   Labs, I & O's, Diet advancement  REASON FOR ASSESSMENT:   Consult New TPN/TNA  ASSESSMENT:   78 y.o female with PMH of GERD, DVT, HLD, CKD, osteoporosis, breast cancer (2004). Who presented with severe abdominal pain and constipation. Found to have duodenal ulcer perforation.  s/p Laparoscopic graham patch of perforated duodenal bulb ulcer.  ***  Patietn sitting inchair on vivist, husband at bedisde  Had 50% of eggs and 90% bacon. Eating pretty good. Husband in room reprots she had a pretty good appetiet before but it is not what it used to be but still pretty good. New weight recommend new weight. Medically ready for discharge. Delirium improved, awaiting SNF.  Admit weight: *** Current weight: ***  Last Weight  Most recent update: 10/06/2023  5:07 AM    Weight  60.8 kg (134 lb 0.6 oz)             Average Meal Intake: ***-***: ***% intake x *** recorded meals  Nutritionally Relevant Medications: Scheduled Meds:  (feeding supplement) PROSource Plus  30 mL Oral BID BM   acetaminophen  1,000 mg Oral Q6H   amLODipine  5 mg Oral Daily   Chlorhexidine Gluconate Cloth  6 each Topical Q0600   enoxaparin (LOVENOX) injection  40 mg Subcutaneous Q24H   feeding supplement  1 Container Oral TID BM   feeding supplement  237 mL Oral BID BM   insulin aspart  0-5 Units Subcutaneous QHS   insulin aspart  0-9 Units Subcutaneous TID WC   melatonin  3 mg Oral QHS   pantoprazole  40 mg Oral BID   rivastigmine  4.6 mg Transdermal Daily   sodium chloride flush  10-40 mL  Intracatheter Q12H   Continuous Infusions: PRN Meds:.hydrALAZINE, HYDROmorphone (DILAUDID) injection, methocarbamol, metoprolol tartrate, mouth rinse, oxyCODONE, sodium chloride flush  Labs Reviewed: *** CBG ranges from ***-*** mg/dL over the last 24 hours HgbA1c ***   NUTRITION - FOCUSED PHYSICAL EXAM:  {RD Focused Exam List:21252}  Diet Order:   Diet Order             DIET SOFT Room service appropriate? Yes; Fluid consistency: Thin  Diet effective now                   EDUCATION NEEDS:   Not appropriate for education at this time  Skin:  Skin Assessment: Skin Integrity Issues: Skin Integrity Issues:: Incisions Incisions: Abdomen  Last BM:  PTA  Height:   Ht Readings from Last 1 Encounters:  09/29/23 5\' 3"  (1.6 m)    Weight:   Wt Readings from Last 1 Encounters:  10/06/23 60.8 kg    Ideal Body Weight:  52.3 kg  BMI:  Body mass index is 23.74 kg/m.  Estimated Nutritional Needs:   Kcal:  1500-1700 kcal  Protein:  70-90 gm  Fluid:  > 1.5L/day    ***

## 2023-10-06 NOTE — Progress Notes (Signed)
 6 Days Post-Op  Subjective: No acute changes. Stable. Tolerating soft diet. Denies pain and nausea this morning.  Objective: Vital signs in last 24 hours: Temp:  [98.2 F (36.8 C)-98.8 F (37.1 C)] 98.2 F (36.8 C) (03/11 0313) Pulse Rate:  [74-92] 92 (03/10 2253) Resp:  [12-20] 20 (03/11 0313) BP: (142-159)/(74-82) 159/82 (03/10 2253) SpO2:  [92 %-97 %] 93 % (03/10 2253) Weight:  [60.8 kg] 60.8 kg (03/11 0500) Last BM Date : 10/05/23  Intake/Output from previous day: 03/10 0701 - 03/11 0700 In: 110 [P.O.:100; I.V.:10] Out: 15 [Drains:15] Intake/Output this shift: No intake/output data recorded.  PE: Gen:  Alert, NAD, pleasant Pulm: normal work of breathing on room air Abd: Soft, nondistended, incisions clean and dry. Clean dry dressing over prior drain site. Neuro: alert, no focal deficitis  Lab Results:  Recent Labs    10/04/23 0909 10/05/23 0727  WBC 8.2 9.9  HGB 10.8* 10.8*  HCT 33.3* 32.6*  PLT 260 277   BMET Recent Labs    10/04/23 1155 10/05/23 0727  NA 136 138  K 4.1 3.9  CL 103 106  CO2 23 26  GLUCOSE 124* 96  BUN 19 21  CREATININE 0.75 1.03*  CALCIUM 9.1 8.8*   PT/INR No results for input(s): "LABPROT", "INR" in the last 72 hours. CMP     Component Value Date/Time   NA 138 10/05/2023 0727   NA 142 06/02/2022 1351   NA 143 05/30/2013 1040   K 3.9 10/05/2023 0727   K 4.6 05/30/2013 1040   CL 106 10/05/2023 0727   CL 105 05/05/2012 1058   CO2 26 10/05/2023 0727   CO2 27 05/30/2013 1040   GLUCOSE 96 10/05/2023 0727   GLUCOSE 98 05/30/2013 1040   GLUCOSE 93 05/05/2012 1058   BUN 21 10/05/2023 0727   BUN 9 06/02/2022 1351   BUN 14.4 05/30/2013 1040   CREATININE 1.03 (H) 10/05/2023 0727   CREATININE 1.1 05/30/2013 1040   CALCIUM 8.8 (L) 10/05/2023 0727   CALCIUM 10.1 05/30/2013 1040   PROT 5.6 (L) 10/01/2023 0535   PROT 7.1 06/02/2022 1351   PROT 7.7 05/30/2013 1040   ALBUMIN 2.8 (L) 10/01/2023 0535   ALBUMIN 4.8 06/02/2022  1351   ALBUMIN 3.8 05/30/2013 1040   AST 16 10/01/2023 0535   AST 26 05/30/2013 1040   ALT 18 10/01/2023 0535   ALT 22 05/30/2013 1040   ALKPHOS 64 10/01/2023 0535   ALKPHOS 88 05/30/2013 1040   BILITOT 0.5 10/01/2023 0535   BILITOT 0.5 06/02/2022 1351   BILITOT 0.59 05/30/2013 1040   GFRNONAA 56 (L) 10/05/2023 0727   GFRAA 72 (L) 11/15/2013 0630   Lipase     Component Value Date/Time   LIPASE 20 09/29/2023 1751    Studies/Results: No results found.       Assessment/Plan POD 6 s/p Laparoscopic graham patch of perforated duodenal bulb ulcer by Dr. Dossie Der on 09/30/23 - Upper GI 3/7 with no evidence of leak. On soft diet. - Avoid NSAIDs - H pylori stool antigen negative - Mobilize, PT - Delirium precautions, minimize narcotics - Appreciate TRH assist with underlying medical conditions  - BID PPI  FEN - Soft diet, BID PPI VTE - SCDs, lovenox ID - Completed 5 days Zosyn/fluconazole postop Dispo: Med-surg status. PT recommending SNF at discharge. Referral placed yesterday. Patient is medically stable for discharge once she has placement.  Hx DVT, factor V Leiden - Was not on medication for this pre-op. Discussed  with TRH, patient is following with PCP for this.    LOS: 6 days    Fritzi Mandes, MD [facility] 10/06/2023, 8:05 AM Please see Amion for pager number during day hours 7:00am-4:30pm

## 2023-10-06 NOTE — Discharge Instructions (Signed)

## 2023-10-06 NOTE — Discharge Summary (Signed)
 [facility] Discharge Summary   Patient ID: Samantha Ali MRN: [id number] DOB/AGE: January 01, 1946 78 y.o.  Admit date: 09/29/2023 Discharge date: 10/07/2023  Admitting Diagnosis: Perforated gastric ulcer  Discharge Diagnosis Perforated duodenal ulcer S/P graham patch   Consultants Critical care Internal medicine   Imaging: No results found.  Procedures Dr. Renae Fickle Stechschulte (09/30/23) - laparoscopic graham patch repair of perforated duodenal bulb ulcer  Hospital Course:  Patient is a 78 year old female who presented to the ED with abdominal pain.  Workup showed pneumoperitoneum with concern for perforated ulcer.  Patient was admitted and underwent procedure listed above.  Tolerated procedure and was transferred to the ICU post-operatively. CCM was consulted for ventilator management. Patient extubated on POD1 and tolerated well. TPN was started on POD1. NGT was removed accidentally on POD1, left out and patient kept strictly NPO. UGI POD2 without leak, patient started on CLD. Diet was advanced as tolerated. H. Pylori was negative. Drain removed on POD5. Patient was evaluated by therapies during admission and recommended for home with homehealth PT. She has the support of her husband at home.  On POD#7, the patient was voiding well, tolerating diet, ambulating well, pain well controlled, vital signs stable, incisions c/d/i and felt stable for discharge home.  Patient will follow up in our office as below and knows to call with questions or concerns.   Physical Exam: General:  Alert, NAD, pleasant, comfortable Abd:  Soft, ND, mild tenderness, incisions C/D/I   I or a member of my team have reviewed this patient in the Controlled Substance Database.   Allergies as of 10/06/2023       Reactions   Aricept [donepezil]    Bad dreams   Nsaids    Perf duodenal ulcer        Medication List     STOP taking these medications    HYDROcodone-acetaminophen 5-325 MG  tablet Commonly known as: NORCO/VICODIN       TAKE these medications    acetaminophen 500 MG tablet Commonly known as: TYLENOL Take 2 tablets (1,000 mg total) by mouth every 6 (six) hours as needed for mild pain (pain score 1-3) or fever. What changed:  how much to take when to take this reasons to take this additional instructions   feeding supplement Liqd Take 237 mLs by mouth 2 (two) times daily between meals.   methocarbamol 500 MG tablet Commonly known as: ROBAXIN Take 1 tablet (500 mg total) by mouth every 6 (six) hours as needed for muscle spasms. What changed:  when to take this reasons to take this   oxyCODONE 5 MG immediate release tablet Commonly known as: Oxy IR/ROXICODONE Take 0.5-1 tablets (2.5-5 mg total) by mouth every 4 (four) hours as needed for moderate pain (pain score 4-6) or severe pain (pain score 7-10).   pantoprazole 40 MG tablet Commonly known as: PROTONIX Take 1 tablet (40 mg total) by mouth 2 (two) times daily.   rivastigmine 4.6 mg/24hr Commonly known as: Exelon Place 1 patch (4.6 mg total) onto the skin daily.   Salicylic Acid 50 % Soln Apply 1 application  topically See admin instructions. Apply topically to affected area(s) at bedtime   sertraline 25 MG tablet Commonly known as: ZOLOFT Take 25 mg by mouth daily.           Signed: Hosie Spangle , [facility] 10/06/2023, 9:57 AM Please see Amion for pager number during day hours 7:00am-4:30pm

## 2023-10-06 NOTE — TOC Progression Note (Signed)
 Transition of Care [facility]) - Progression Note    Patient Details  Name: Samantha Ali MRN: [id number] Date of Birth: [birth year]  Transition of Care [facility]) CM/SW Contact  Eduard Roux, [place] Phone Number: 10/06/2023, 9:58 AM  Clinical Narrative:     Sent message to [facility]- request they review referral for STR. Waiting on response.  Antony Blackbird, MSW, LCSW Clinical Social Worker    Expected Discharge Plan: Skilled Nursing Facility Barriers to Discharge: Insurance Authorization, SNF Pending bed offer  Expected Discharge Plan and Services       Living arrangements for the past 2 months: Single Family Home                                       Social Determinants of Health (SDOH) Interventions SDOH Screenings   Food Insecurity: No Food Insecurity (09/30/2023)  Housing: Low Risk  (09/30/2023)  Transportation Needs: No Transportation Needs (09/30/2023)  Utilities: Not At Risk (09/30/2023)  Social Connections: Unknown (09/30/2023)  Tobacco Use: Low Risk  (09/28/2023)    Readmission Risk Interventions     No data to display

## 2023-10-06 NOTE — Plan of Care (Signed)
 Problem: Education: Goal: Knowledge of General Education information will improve Description: Including pain rating scale, medication(s)/side effects and non-pharmacologic comfort measures 10/06/2023 0208 by Carlene Coria, RN Outcome: Progressing 10/05/2023 2029 by Carlene Coria, RN Outcome: Progressing   Problem: Health Behavior/Discharge Planning: Goal: Ability to manage health-related needs will improve 10/06/2023 0208 by Carlene Coria, RN Outcome: Progressing 10/05/2023 2029 by Carlene Coria, RN Outcome: Progressing   Problem: Clinical Measurements: Goal: Ability to maintain clinical measurements within normal limits will improve 10/06/2023 0208 by Carlene Coria, RN Outcome: Progressing 10/05/2023 2029 by Carlene Coria, RN Outcome: Progressing Goal: Will remain free from infection 10/06/2023 0208 by Carlene Coria, RN Outcome: Progressing 10/05/2023 2029 by Carlene Coria, RN Outcome: Progressing Goal: Diagnostic test results will improve 10/06/2023 0208 by Carlene Coria, RN Outcome: Progressing 10/05/2023 2029 by Carlene Coria, RN Outcome: Progressing Goal: Respiratory complications will improve 10/06/2023 0208 by Carlene Coria, RN Outcome: Progressing 10/05/2023 2029 by Carlene Coria, RN Outcome: Progressing Goal: Cardiovascular complication will be avoided 10/06/2023 0208 by Carlene Coria, RN Outcome: Progressing 10/05/2023 2029 by Carlene Coria, RN Outcome: Progressing   Problem: Activity: Goal: Risk for activity intolerance will decrease 10/06/2023 0208 by Carlene Coria, RN Outcome: Progressing 10/05/2023 2029 by Carlene Coria, RN Outcome: Progressing   Problem: Nutrition: Goal: Adequate nutrition will be maintained 10/06/2023 0208 by Carlene Coria, RN Outcome: Progressing 10/05/2023 2029 by Carlene Coria, RN Outcome: Progressing   Problem: Coping: Goal: Level of anxiety will  decrease 10/06/2023 0208 by Carlene Coria, RN Outcome: Progressing 10/05/2023 2029 by Carlene Coria, RN Outcome: Progressing   Problem: Elimination: Goal: Will not experience complications related to bowel motility 10/06/2023 0208 by Carlene Coria, RN Outcome: Progressing 10/05/2023 2029 by Carlene Coria, RN Outcome: Progressing Goal: Will not experience complications related to urinary retention 10/06/2023 0208 by Carlene Coria, RN Outcome: Progressing 10/05/2023 2029 by Carlene Coria, RN Outcome: Progressing   Problem: Pain Managment: Goal: General experience of comfort will improve and/or be controlled 10/06/2023 0208 by Carlene Coria, RN Outcome: Progressing 10/05/2023 2029 by Carlene Coria, RN Outcome: Progressing   Problem: Safety: Goal: Ability to remain free from injury will improve 10/06/2023 0208 by Carlene Coria, RN Outcome: Progressing 10/05/2023 2029 by Carlene Coria, RN Outcome: Progressing   Problem: Skin Integrity: Goal: Risk for impaired skin integrity will decrease 10/06/2023 0208 by Carlene Coria, RN Outcome: Progressing 10/05/2023 2029 by Carlene Coria, RN Outcome: Progressing   Problem: Activity: Goal: Ability to tolerate increased activity will improve 10/06/2023 0208 by Carlene Coria, RN Outcome: Progressing 10/05/2023 2029 by Carlene Coria, RN Outcome: Progressing   Problem: Respiratory: Goal: Ability to maintain a clear airway and adequate ventilation will improve 10/06/2023 0208 by Carlene Coria, RN Outcome: Progressing 10/05/2023 2029 by Carlene Coria, RN Outcome: Progressing   Problem: Role Relationship: Goal: Method of communication will improve 10/06/2023 0208 by Carlene Coria, RN Outcome: Progressing 10/05/2023 2029 by Carlene Coria, RN Outcome: Progressing   Problem: Safety: Goal: Non-violent Restraint(s) 10/06/2023 0208 by Carlene Coria,  RN Outcome: Progressing 10/05/2023 2029 by Carlene Coria, RN Outcome: Progressing   Problem: Education: Goal: Ability to describe self-care measures that may prevent or decrease complications (Diabetes Survival Skills Education) will improve 10/06/2023 0208 by Carlene Coria, RN Outcome: Progressing 10/05/2023 2029 by Carlene Coria, RN Outcome: Progressing Goal: Individualized  Educational Video(s) 10/06/2023 0208 by Carlene Coria, RN Outcome: Progressing 10/05/2023 2029 by Carlene Coria, RN Outcome: Progressing   Problem: Coping: Goal: Ability to adjust to condition or change in health will improve 10/06/2023 0208 by Carlene Coria, RN Outcome: Progressing 10/05/2023 2029 by Carlene Coria, RN Outcome: Progressing   Problem: Fluid Volume: Goal: Ability to maintain a balanced intake and output will improve 10/06/2023 0208 by Carlene Coria, RN Outcome: Progressing 10/05/2023 2029 by Carlene Coria, RN Outcome: Progressing   Problem: Health Behavior/Discharge Planning: Goal: Ability to identify and utilize available resources and services will improve 10/06/2023 0208 by Carlene Coria, RN Outcome: Progressing 10/05/2023 2029 by Carlene Coria, RN Outcome: Progressing Goal: Ability to manage health-related needs will improve 10/06/2023 0208 by Carlene Coria, RN Outcome: Progressing 10/05/2023 2029 by Carlene Coria, RN Outcome: Progressing   Problem: Metabolic: Goal: Ability to maintain appropriate glucose levels will improve 10/06/2023 0208 by Carlene Coria, RN Outcome: Progressing 10/05/2023 2029 by Carlene Coria, RN Outcome: Progressing   Problem: Nutritional: Goal: Maintenance of adequate nutrition will improve 10/06/2023 0208 by Carlene Coria, RN Outcome: Progressing 10/05/2023 2029 by Carlene Coria, RN Outcome: Progressing Goal: Progress toward achieving an optimal weight will improve 10/06/2023  0208 by Carlene Coria, RN Outcome: Progressing 10/05/2023 2029 by Carlene Coria, RN Outcome: Progressing   Problem: Skin Integrity: Goal: Risk for impaired skin integrity will decrease 10/06/2023 0208 by Carlene Coria, RN Outcome: Progressing 10/05/2023 2029 by Carlene Coria, RN Outcome: Progressing   Problem: Tissue Perfusion: Goal: Adequacy of tissue perfusion will improve 10/06/2023 0208 by Carlene Coria, RN Outcome: Progressing 10/05/2023 2029 by Carlene Coria, RN Outcome: Progressing

## 2023-10-06 NOTE — Progress Notes (Addendum)
 PROGRESS NOTE    Samantha Ali  XIP:[id number] DOB: 09/12/1945 DOA: 09/29/2023 PCP: Sigmund Hazel, MD    Brief Narrative:   Samantha Ali is a 78 y.o. female with past medical history of hyperlipidemia, CKD, history of DVT, factor V Leiden mutation, history of breast cancer, and osteoporosis initially presented to the hospital with acute on abdomen on 09/29/2023.  CT scan done showed perforated duodenal versus gastric ulcer and patient underwent emergent diagnostic laparotomy on 3 5.  There was note of perforation in the anterior portion of duodenal bulb with purulent peritonitis, status post laparoscopic repair and JP drain was placed and patient was transferred to the ICU and was intubated.  Patient was started on empiric antibiotics of Zosyn and fluconazole.  Subsequently patient was transferred out of the ICU.  Patient was noted to be agitated and had pulled out NG tube and was noted to have fever tachycardia, tachypnea and elevated blood pressure so medical team was consulted for further evaluation and treatment.  At this time, patient's delirium has improved and patient is awaiting for skilled nursing facility.    Assessment and Plan:  Sepsis secondary to duodenal bulb perforation s/p laparoscopic repair Peritonitis secondary to perforation  Status post laparoscopic Cheree Ditto patch of perforated duodenal bulb ulcer by Dr. Dossie Der on 09/30/23.  Blood cultures negative in 5 days.  Completed IV Zosyn and fluconazole.  Leukocytosis has normalized x3,  Temperature max of 98.45F.    Patient underwent upper GI study 3/7 with no evidence of postoperative leak at the duodenal bulb.  Large hiatal hernia..  Tolerating oral diet.  Off TPN.  Further plan as per general surgery.  Acute hypoxic respiratory failure Resolved.  On room air.  Initially was intubated in the ICU.  Delirium Resolved.  Likely hospital induced delirium in the background of mild to moderate dementia as per neurology in the past.    Treated with a rivastigmine patch as unable to tolerate memantine and donepezil previously.     Elevated blood pressure. Not on antihypertensives as outpatient.  Currently on as needed metoprolol.  Will change to low-dose amlodipine since blood pressure seems to be on marginally high side.  Recommend continuation of low-dose amlodipine on discharge.  Added on discharge MAR.   History of recurrent DVT Heterozygote factor 5 Leiden mutation Patient with prior history of DVT  in the 1990s and subsequent 07/2013 after knee replacement was thought to be provoked.    Patient is currently not on anticoagulation for some time now likely from her PCP.  No plans for initiating anticoagulation.  Patient was on aspirin as outpatient.  Will need to follow-up with PCP   Hypokalemia Improved after replacement.  Latest potassium of 3.9.   Acute kidney injury Improved.  Initial creatinine up to 1.2.  Creatinine of 1.0 at this time.  Improved with IV fluids.    Deconditioning debility.  Seen by PT and recommended skilled nursing facility placement.   DVT prophylaxis: enoxaparin (LOVENOX) injection 40 mg Start: 09/30/23 1000 SCDs Start: 09/30/23 0242   Code Status:     Code Status: Full Code  Disposition:  skilled nursing facility.  As per primary team.  Patient is medically stable for disposition.  Communicated with general surgery.  Will sign off at this time.  Status is: Inpatient   Family Communication: Spoke son on the phone at bedside on 10/03/2023.   Procedures:   Status post laparoscopic Cheree Ditto patch of perforated duodenal bulb ulcer by Dr. Dossie Der on  09/30/23.  Antimicrobials:  Fluconazole and Zosyn-completed.  Anti-infectives (From admission, onward)    Start     Dose/Rate Route Frequency Ordered Stop   10/01/23 0400  fluconazole (DIFLUCAN) IVPB 400 mg        400 mg 100 mL/hr over 120 Minutes Intravenous Every 24 hours 09/30/23 0303 10/05/23 0710   09/30/23 0600   piperacillin-tazobactam (ZOSYN) IVPB 3.375 g        3.375 g 12.5 mL/hr over 240 Minutes Intravenous Every 8 hours 09/30/23 0246 10/05/23 1000   09/30/23 0400  fluconazole (DIFLUCAN) IVPB 400 mg        400 mg 100 mL/hr over 120 Minutes Intravenous Every 2 hours 09/30/23 0303 09/30/23 0754   09/29/23 2134  ceFEPIme (MAXIPIME) 1 g injection       Note to Pharmacy: Schuyler Amor: cabinet override      09/29/23 2134 09/30/23 0944   09/29/23 2130  ceFEPIme (MAXIPIME) 1 g in sodium chloride 0.9 % 100 mL IVPB        1 g 200 mL/hr over 30 Minutes Intravenous  Once 09/29/23 2119 09/29/23 2218   09/29/23 2130  metroNIDAZOLE (FLAGYL) IVPB 500 mg        500 mg 100 mL/hr over 60 Minutes Intravenous  Once 09/29/23 2119 09/29/23 2315       Subjective: Today, patient was seen and examined at bedside.  Patient denies any interval complaints.  No nausea vomiting fever chills or rigor.  Patient's husband at bedside.  Tolerating oral diet.  Awaiting for skilled nursing facility.    Objective: Vitals:   10/05/23 2253 10/06/23 0313 10/06/23 0500 10/06/23 0849  BP: (!) 159/82   (!) 149/75  Pulse: 92   78  Resp: 16 20  18   Temp: 98.8 F (37.1 C) 98.2 F (36.8 C)  98 F (36.7 C)  TempSrc: Oral Oral  Oral  SpO2: 93%   94%  Weight:   60.8 kg   Height:        Intake/Output Summary (Last 24 hours) at 10/06/2023 1007 Last data filed at 10/06/2023 0500 Gross per 24 hour  Intake 110 ml  Output --  Net 110 ml   Filed Weights   10/04/23 0442 10/05/23 0500 10/06/23 0500  Weight: 60.8 kg 60.8 kg 60.8 kg    Physical Examination: Body mass index is 23.74 kg/m.   General:  Average built, not in obvious distress, Communicative, oriented HENT:   No scleral pallor or icterus noted. Oral mucosa is moist.  Chest:    Diminished breath sounds bilaterally. No crackles or wheezes.  CVS: S1 &S2 heard. No murmur.  Regular rate and rhythm. Abdomen: Soft, nontender bowel sounds are present. Laparascopic ports.    Extremities: No cyanosis, clubbing or edema.  Peripheral pulses are palpable.  Right upper extremity PICC line in place. Psych: Alert, awake and Communicative, oriented CNS:  No cranial nerve deficits.  Moving all extremities. Skin: Warm and dry.  No rashes noted.  Data Reviewed:   CBC: Recent Labs  Lab 10/01/23 0535 10/02/23 0503 10/03/23 0500 10/04/23 0909 10/05/23 0727  WBC 13.2* 17.0* 9.2 8.2 9.9  HGB 12.1 12.7 11.3* 10.8* 10.8*  HCT 36.6 37.2 33.8* 33.3* 32.6*  MCV 90.8 88.8 95.5 96.2 92.6  PLT 267 292 254 260 277    Basic Metabolic Panel: Recent Labs  Lab 09/30/23 0330 09/30/23 0345 10/01/23 0535 10/02/23 0503 10/03/23 0813 10/04/23 1155 10/05/23 0727  NA 138   < > 136 136 143 136  138  K 3.8   < > 3.4* 3.3* 4.1 4.1 3.9  CL 107  --  108 103 106 103 106  CO2 22  --  23 25 25 23 26   GLUCOSE 164*  --  137* 131* 118* 124* 96  BUN 18  --  11 11 19 19 21   CREATININE 0.72  --  0.76 0.73 0.82 0.75 1.03*  CALCIUM 8.7*  --  7.9* 8.7* 9.4 9.1 8.8*  MG 1.7  --  2.0 2.0 2.1 2.1 2.0  PHOS 3.9  --  2.1*  --   --   --   --    < > = values in this interval not displayed.    Liver Function Tests: Recent Labs  Lab 09/29/23 1751 10/01/23 0535  AST 11* 16  ALT 10 18  ALKPHOS 101 64  BILITOT 0.6 0.5  PROT 7.3 5.6*  ALBUMIN 4.6 2.8*     Radiology Studies: No results found.     LOS: 6 days    Joycelyn Das, MD [facility] Available via Epic secure chat 7am-7pm After these hours, please refer to coverage provider listed on [email or web address] 10/06/2023, 10:07 AM

## 2023-10-06 NOTE — TOC Progression Note (Signed)
 Transition of Care [facility]) - Progression Note    Patient Details  Name: Samantha Ali MRN: [id number] Date of Birth: 1946/04/19  [facility] CM/SW Contact  Eduard Roux, [place] Phone Number: 10/06/2023, 1:50 PM  Clinical Narrative:     11:40 am CSW met with patient's spouse,Samantha Ali. CSW introduced self and explained role. CSW informed Samantha Ali did not have any availability. He decided on [facility].   CSW confirmed with [facility].   2:30pm -Insurance authorization started and pending: reference # [id number].   TOC will continue to follow and assist with discharge planning.  Antony Blackbird, MSW, LCSW Clinical Social Worker     Expected Discharge Plan: Skilled Nursing Facility Barriers to Discharge: Insurance Authorization, SNF Pending bed offer  Expected Discharge Plan and Services       Living arrangements for the past 2 months: Single Family Home                                       Social Determinants of Health (SDOH) Interventions SDOH Screenings   Food Insecurity: No Food Insecurity (09/30/2023)  Housing: Low Risk  (09/30/2023)  Transportation Needs: No Transportation Needs (09/30/2023)  Utilities: Not At Risk (09/30/2023)  Social Connections: Unknown (09/30/2023)  Tobacco Use: Low Risk  (09/28/2023)    Readmission Risk Interventions     No data to display

## 2023-10-07 ENCOUNTER — Encounter (HOSPITAL_COMMUNITY): Payer: Self-pay

## 2023-10-07 LAB — GLUCOSE, CAPILLARY
Glucose-Capillary: 98 mg/dL (ref 70–99)
Glucose-Capillary: 105 mg/dL — ABNORMAL HIGH (ref 70–99)
Glucose-Capillary: 130 mg/dL — ABNORMAL HIGH (ref 70–99)

## 2023-10-07 MED ORDER — PANTOPRAZOLE SODIUM 40 MG PO TBEC: DELAYED_RELEASE_TABLET | ORAL | Status: AC

## 2023-10-07 MED ORDER — SERTRALINE HCL 50 MG PO TABS
25.0000 mg | ORAL_TABLET | Freq: Every day | ORAL | Status: DC
  Administered 2023-10-07: 25 mg via ORAL
  Filled 2023-10-07: qty 1

## 2023-10-07 NOTE — Progress Notes (Signed)
 Physical Therapy Treatment and Discharge  Patient Details Name: Samantha Ali MRN: [id number] DOB: [birth year] Today's Date: 10/07/2023   History of Present Illness The pt is a 78 yo female presenting 3/4 with chest pain and lower abdominal pain, CT showed ruptured ulcer in intestine. S/P laparoscopic patch of perforated ulcer 3/5. PMH includes: arthritis, breast cancer, CKD, DVT, GERD, HLD, R TKA, and R rotator cuff surgery.    PT Comments  Patient agreeable and eager to participate with therapy. Patient demonstrates good technique and an improvement with tolerance to functional activities. Under supervision, patient able transition herself EOB with no additional assistance from therapy. Patient requires CGA for sit > stand and stand > sit transfer to ensure safety. Patient requested to ambulate around the majority of the unit (343ft) , following stair negotiation. Patient was able to ascend/descend 2-steps with HHA +1 to ensure safety and stability. Patient educated on sequencing for stair negotiation. The patient will make an excellent candidate for HHPT to address her remaining impairments. No Acute PT needs recommended at this time.    If plan is discharge home, recommend the following: Assistance with cooking/housework;Direct supervision/assist for medications management;Direct supervision/assist for financial management;Assist for transportation;Help with stairs or ramp for entrance;Supervision due to cognitive status;A little help with walking and/or transfers;A little help with bathing/dressing/bathroom   Can travel by private vehicle     Yes  Equipment Recommendations  Rolling walker (2 wheels)    Recommendations for Other Services       Precautions / Restrictions Precautions Precautions: Fall Recall of Precautions/Restrictions: Impaired Precaution/Restrictions Comments: abdominal precautions, JP drain RLQ Restrictions Weight Bearing Restrictions Per Provider Order: No      Mobility  Bed Mobility Overal bed mobility: Needs Assistance Bed Mobility: Supine to Sit, Sit to Supine     Supine to sit: Supervision Sit to supine: Supervision   General bed mobility comments: Patient able to maneuver herself EOB without any assitance, use HOB rails to assist with shifting hips forward    Transfers Overall transfer level: Needs assistance Equipment used: Rolling walker (2 wheels) Transfers: Sit to/from Stand Sit to Stand: Contact guard assist           General transfer comment: CGA with cues for hand placement and to ensure safety    Ambulation/Gait Ambulation/Gait assistance: Contact guard assist, Supervision Gait Distance (Feet): 350 Feet Assistive device: Rolling walker (2 wheels), 1 person hand held assist Gait Pattern/deviations: Trunk flexed Gait velocity: decreased     General Gait Details: Patient requested to walk around the unit after walking 2Laps around the unit with her husband this morning   Stairs Stairs: Yes Stairs assistance: Contact guard assist Stair Management: No rails, Forwards Number of Stairs: 3 General stair comments: HHA +1   Wheelchair Mobility     Tilt Bed    Modified Rankin (Stroke Patients Only)       Balance Overall balance assessment: Needs assistance Sitting-balance support: Feet supported Sitting balance-Leahy Scale: Good Sitting balance - Comments: CGA to supwervision   Standing balance support: No upper extremity supported, During functional activity, Single extremity supported Standing balance-Leahy Scale: Fair Standing balance comment: can static stand without UE support, benefits from at least single UE support for gait                            Communication Communication Communication: No apparent difficulties  Cognition Arousal: Alert Behavior During Therapy: [facility] for tasks assessed/performed  PT - Cognitive impairments: History of cognitive impairments, Problem solving,  Safety/Judgement, Memory                         Following commands: Intact      Cueing    Exercises      General Comments        Pertinent Vitals/Pain Pain Assessment Pain Assessment: Faces Faces Pain Scale: Hurts a little bit Pain Location: R abdominal Pain Descriptors / Indicators: Grimacing Pain Intervention(s): Monitored during session    Home Living                          Prior Function            PT Goals (current goals can now be found in the care plan section) Acute Rehab PT Goals Patient Stated Goal: to be able to be home alone while spouse at work - per spouse PT Goal Formulation: With patient/family Time For Goal Achievement: 10/15/23 Potential to Achieve Goals: Good Progress towards PT goals: Progressing toward goals    Frequency    Min 1X/week      PT Plan      Co-evaluation              AM-PAC PT "6 Clicks" Mobility   Outcome Measure  Help needed turning from your back to your side while in a flat bed without using bedrails?: A Little Help needed moving from lying on your back to sitting on the side of a flat bed without using bedrails?: A Little Help needed moving to and from a bed to a chair (including a wheelchair)?: A Little Help needed standing up from a chair using your arms (e.g., wheelchair or bedside chair)?: A Little Help needed to walk in hospital room?: A Little Help needed climbing 3-5 steps with a railing? : A Little 6 Click Score: 18    End of Session Equipment Utilized During Treatment: Gait belt Activity Tolerance: Patient tolerated treatment well Patient left: in bed;with bed alarm set;with call bell/phone within reach Nurse Communication: Mobility status PT Visit Diagnosis: Unsteadiness on feet (R26.81);Other abnormalities of gait and mobility (R26.89)     Time: 6387-5643 PT Time Calculation (min) (ACUTE ONLY): 28 min  Charges:    $Therapeutic Activity: 23-37 mins PT General  Charges $$ ACUTE PT VISIT: 1 Visit                    Samantha Ali, SPT   Somtochukwu Woollard 10/07/2023, 2:57 PM

## 2023-10-07 NOTE — Progress Notes (Signed)
 7 Days Post-Op  Subjective: No acute changes. Tolerating diet. Up in chair this morning. Denies nausea.  Objective: Vital signs in last 24 hours: Temp:  [98 F (36.7 C)-98.6 F (37 C)] 98.4 F (36.9 C) (03/12 0824) Pulse Rate:  [80-87] 87 (03/12 0824) Resp:  [16-19] 16 (03/12 0824) BP: (109-139)/(71-78) 109/78 (03/12 0824) SpO2:  [94 %-96 %] 96 % (03/12 0824) Weight:  [60.8 kg] 60.8 kg (03/12 0500) Last BM Date : 10/05/23  Intake/Output from previous day: 03/11 0701 - 03/12 0700 In: 10 [I.V.:10] Out: -  Intake/Output this shift: No intake/output data recorded.  PE: Gen:  Alert, NAD, pleasant Pulm: normal work of breathing on room air Abd: Soft, nondistended, incisions clean and dry. Prior drain site is clean and dry (dressing removed). Neuro: alert, no focal deficitis  Lab Results:  Recent Labs    10/05/23 0727  WBC 9.9  HGB 10.8*  HCT 32.6*  PLT 277   BMET Recent Labs    10/04/23 1155 10/05/23 0727  NA 136 138  K 4.1 3.9  CL 103 106  CO2 23 26  GLUCOSE 124* 96  BUN 19 21  CREATININE 0.75 1.03*  CALCIUM 9.1 8.8*   PT/INR No results for input(s): "LABPROT", "INR" in the last 72 hours. CMP     Component Value Date/Time   NA 138 10/05/2023 0727   NA 142 06/02/2022 1351   NA 143 05/30/2013 1040   K 3.9 10/05/2023 0727   K 4.6 05/30/2013 1040   CL 106 10/05/2023 0727   CL 105 05/05/2012 1058   CO2 26 10/05/2023 0727   CO2 27 05/30/2013 1040   GLUCOSE 96 10/05/2023 0727   GLUCOSE 98 05/30/2013 1040   GLUCOSE 93 05/05/2012 1058   BUN 21 10/05/2023 0727   BUN 9 06/02/2022 1351   BUN 14.4 05/30/2013 1040   CREATININE 1.03 (H) 10/05/2023 0727   CREATININE 1.1 05/30/2013 1040   CALCIUM 8.8 (L) 10/05/2023 0727   CALCIUM 10.1 05/30/2013 1040   PROT 5.6 (L) 10/01/2023 0535   PROT 7.1 06/02/2022 1351   PROT 7.7 05/30/2013 1040   ALBUMIN 2.8 (L) 10/01/2023 0535   ALBUMIN 4.8 06/02/2022 1351   ALBUMIN 3.8 05/30/2013 1040   AST 16 10/01/2023 0535    AST 26 05/30/2013 1040   ALT 18 10/01/2023 0535   ALT 22 05/30/2013 1040   ALKPHOS 64 10/01/2023 0535   ALKPHOS 88 05/30/2013 1040   BILITOT 0.5 10/01/2023 0535   BILITOT 0.5 06/02/2022 1351   BILITOT 0.59 05/30/2013 1040   GFRNONAA 56 (L) 10/05/2023 0727   GFRAA 72 (L) 11/15/2013 0630   Lipase     Component Value Date/Time   LIPASE 20 09/29/2023 1751    Studies/Results: No results found.       Assessment/Plan POD 7 s/p Laparoscopic graham patch of perforated duodenal bulb ulcer by Dr. Dossie Der on 09/30/23 - Upper GI 3/7 with no evidence of leak. On soft diet. - No NSAIDs, reviewed this again with patient and husband - H pylori stool antigen negative - Mobilize, PT - Delirium precautions, minimize narcotics - BID PPI  FEN - Soft diet, BID PPI VTE - SCDs, lovenox ID - Completed 5 days Zosyn/fluconazole postop. No further abx indicated. Dispo: Med-surg status. Awaiting insurance authorization for SNF placement. Patient is medically stable for discharge once this is approved.  Hx DVT, factor V Leiden - Was not on medication for this pre-op. Discussed with TRH, patient is following with  PCP for this.    LOS: 7 days    Fritzi Mandes, MD [facility] 10/07/2023, 9:43 AM Please see Amion for pager number during day hours 7:00am-4:30pm

## 2023-10-07 NOTE — Progress Notes (Signed)
 Patient's husband called the unit and informed me he went to [facility] but there is not record of any prescription at [facility]. Patient's husband informed to call the unit in the morning to get it fixed

## 2023-10-07 NOTE — TOC Progression Note (Signed)
 Transition of Care (TOC) - Progression Note  Donn Pierini RN,BSN Transitions of Care Unit 4NP (Non Trauma)- RN Case Manager See Treatment Team for direct Phone #   Patient Details  Name: Samantha Ali MRN: [id number] Date of Birth: Jan 09, 1946  Transition of Care [facility]) CM/SW Contact  Zenda Alpers Lenn Sink, RN Phone Number: 10/07/2023, 5:07 PM  Clinical Narrative:    Pt stable for transition home w/ spouse,  Orders placed for [facility] and DME needs. Pt confused, spouse not present at bedside.  Cm attempted to call spouse- no answer- VM left with request for return call.   List for [facility] choice left at bedside- Per CMS guidelines from [email or web address] website with star ratings (copy placed in shadow chart).  CM requested RW from [employer] to be delivered to room prior to discharge.   CM will f/u with spouse for [facility] needs and referral .    Expected Discharge Plan: Skilled Nursing Facility Barriers to Discharge: Barriers Resolved  Expected Discharge Plan and Services   Discharge Planning Services: CM Consult Post Acute Care Choice: Home Health, Durable Medical Equipment Living arrangements for the past 2 months: Single Family Home Expected Discharge Date: 10/07/23               DME Arranged: Dan Humphreys rolling DME Agency: [employer] Date DME Agency Contacted: 10/07/23 Time DME Agency Contacted: 1600 Representative spoke with at DME Agency: Beryl Meager Arranged: PT           Social Determinants of Health (SDOH) Interventions SDOH Screenings   Food Insecurity: No Food Insecurity (09/30/2023)  Housing: Low Risk  (09/30/2023)  Transportation Needs: No Transportation Needs (09/30/2023)  Utilities: Not At Risk (09/30/2023)  Social Connections: Unknown (09/30/2023)  Tobacco Use: Low Risk  (10/07/2023)    Readmission Risk Interventions     No data to display

## 2023-10-07 NOTE — Care Management (Signed)
 ED RNCM received call from [facility] nurse on 4NP concerning patient discharging home needing HH arranged. Met with patient and husband at bedside to discuss [facility] preference from the list. Patient and husband does not have a HH preference wants HH company within network.  Referral sent to liaison Denyse Amass at [facility] referral was accepted.Explained that they will receive a call from the office arranging the initial [facility] visit. Patient and spouse verbalized understanding Information placed on AVS and updated Building services engineer.  No further CM needs identified.

## 2023-10-07 NOTE — Progress Notes (Signed)
 Explained procedure of PICC line removal. HOB less than 45*. Pt held breath upon line removal. Pressure held for 5 min and pt instructed to remain in bed for 30 min. Pressure drsg applied and instructed to monitor and apply pressure directly to site for any s/sx of bleeding and to report as needed. Instructed to keep drsg CDI for 24 hours. Pt/spouse VU. Tomasita Morrow, RN VAST

## 2023-10-07 NOTE — Progress Notes (Signed)
 Pt discharge education and instructions completed with pt and spouse Catrinia Racicot at bedside. All questions and concerns addressed. Pt discharged home with spouse Molly Maduro to transport her home. Pt's spouse handed pt's oxycodone prescription and pt to pick up electronically sent prescription from [facility] [address]. Pt's husband notified about pt's prescriptions changed [facility] [place] [facility]. Pt transported off unit via wheelchair with belongings and spouse to the side. Dionne Bucy RN

## 2023-10-09 DIAGNOSIS — Z9181 History of falling: Secondary | ICD-10-CM | POA: Diagnosis not present

## 2023-10-09 DIAGNOSIS — M810 Age-related osteoporosis without current pathological fracture: Secondary | ICD-10-CM | POA: Diagnosis not present

## 2023-10-09 DIAGNOSIS — E440 Moderate protein-calorie malnutrition: Secondary | ICD-10-CM | POA: Diagnosis not present

## 2023-10-09 DIAGNOSIS — F0394 Unspecified dementia, unspecified severity, with anxiety: Secondary | ICD-10-CM | POA: Diagnosis not present

## 2023-10-09 DIAGNOSIS — E785 Hyperlipidemia, unspecified: Secondary | ICD-10-CM | POA: Diagnosis not present

## 2023-10-09 DIAGNOSIS — F0393 Unspecified dementia, unspecified severity, with mood disturbance: Secondary | ICD-10-CM | POA: Diagnosis not present

## 2023-10-09 DIAGNOSIS — G4700 Insomnia, unspecified: Secondary | ICD-10-CM | POA: Diagnosis not present

## 2023-10-09 DIAGNOSIS — K449 Diaphragmatic hernia without obstruction or gangrene: Secondary | ICD-10-CM | POA: Diagnosis not present

## 2023-10-09 DIAGNOSIS — K219 Gastro-esophageal reflux disease without esophagitis: Secondary | ICD-10-CM | POA: Diagnosis not present

## 2023-10-09 DIAGNOSIS — Z853 Personal history of malignant neoplasm of breast: Secondary | ICD-10-CM | POA: Diagnosis not present

## 2023-10-09 DIAGNOSIS — H53143 Visual discomfort, bilateral: Secondary | ICD-10-CM | POA: Diagnosis not present

## 2023-10-09 DIAGNOSIS — Z86718 Personal history of other venous thrombosis and embolism: Secondary | ICD-10-CM | POA: Diagnosis not present

## 2023-10-09 DIAGNOSIS — D6851 Activated protein C resistance: Secondary | ICD-10-CM | POA: Diagnosis not present

## 2023-10-09 DIAGNOSIS — Z48815 Encounter for surgical aftercare following surgery on the digestive system: Secondary | ICD-10-CM | POA: Diagnosis not present

## 2023-10-09 DIAGNOSIS — Z96651 Presence of right artificial knee joint: Secondary | ICD-10-CM | POA: Diagnosis not present

## 2023-10-09 DIAGNOSIS — N189 Chronic kidney disease, unspecified: Secondary | ICD-10-CM | POA: Diagnosis not present

## 2023-10-09 DIAGNOSIS — F32A Depression, unspecified: Secondary | ICD-10-CM | POA: Diagnosis not present

## 2023-10-09 DIAGNOSIS — F03918 Unspecified dementia, unspecified severity, with other behavioral disturbance: Secondary | ICD-10-CM | POA: Diagnosis not present

## 2023-10-09 DIAGNOSIS — G43909 Migraine, unspecified, not intractable, without status migrainosus: Secondary | ICD-10-CM | POA: Diagnosis not present

## 2023-10-11 ENCOUNTER — Other Ambulatory Visit: Payer: Self-pay | Admitting: Adult Health

## 2023-10-12 NOTE — Telephone Encounter (Signed)
 Last seen on 04/14/23 Follow up scheduled on 12/22/23

## 2023-10-13 DIAGNOSIS — F32A Depression, unspecified: Secondary | ICD-10-CM | POA: Diagnosis not present

## 2023-10-13 DIAGNOSIS — G43909 Migraine, unspecified, not intractable, without status migrainosus: Secondary | ICD-10-CM | POA: Diagnosis not present

## 2023-10-13 DIAGNOSIS — G4700 Insomnia, unspecified: Secondary | ICD-10-CM | POA: Diagnosis not present

## 2023-10-13 DIAGNOSIS — Z96651 Presence of right artificial knee joint: Secondary | ICD-10-CM | POA: Diagnosis not present

## 2023-10-13 DIAGNOSIS — F0393 Unspecified dementia, unspecified severity, with mood disturbance: Secondary | ICD-10-CM | POA: Diagnosis not present

## 2023-10-13 DIAGNOSIS — H53143 Visual discomfort, bilateral: Secondary | ICD-10-CM | POA: Diagnosis not present

## 2023-10-13 DIAGNOSIS — F03918 Unspecified dementia, unspecified severity, with other behavioral disturbance: Secondary | ICD-10-CM | POA: Diagnosis not present

## 2023-10-13 DIAGNOSIS — K219 Gastro-esophageal reflux disease without esophagitis: Secondary | ICD-10-CM | POA: Diagnosis not present

## 2023-10-13 DIAGNOSIS — E440 Moderate protein-calorie malnutrition: Secondary | ICD-10-CM | POA: Diagnosis not present

## 2023-10-13 DIAGNOSIS — E785 Hyperlipidemia, unspecified: Secondary | ICD-10-CM | POA: Diagnosis not present

## 2023-10-13 DIAGNOSIS — Z48815 Encounter for surgical aftercare following surgery on the digestive system: Secondary | ICD-10-CM | POA: Diagnosis not present

## 2023-10-13 DIAGNOSIS — N189 Chronic kidney disease, unspecified: Secondary | ICD-10-CM | POA: Diagnosis not present

## 2023-10-13 DIAGNOSIS — D6851 Activated protein C resistance: Secondary | ICD-10-CM | POA: Diagnosis not present

## 2023-10-13 DIAGNOSIS — M810 Age-related osteoporosis without current pathological fracture: Secondary | ICD-10-CM | POA: Diagnosis not present

## 2023-10-13 DIAGNOSIS — K449 Diaphragmatic hernia without obstruction or gangrene: Secondary | ICD-10-CM | POA: Diagnosis not present

## 2023-10-13 DIAGNOSIS — F0394 Unspecified dementia, unspecified severity, with anxiety: Secondary | ICD-10-CM | POA: Diagnosis not present

## 2023-10-16 DIAGNOSIS — F03918 Unspecified dementia, unspecified severity, with other behavioral disturbance: Secondary | ICD-10-CM | POA: Diagnosis not present

## 2023-10-16 DIAGNOSIS — G4700 Insomnia, unspecified: Secondary | ICD-10-CM | POA: Diagnosis not present

## 2023-10-16 DIAGNOSIS — G43909 Migraine, unspecified, not intractable, without status migrainosus: Secondary | ICD-10-CM | POA: Diagnosis not present

## 2023-10-16 DIAGNOSIS — F32A Depression, unspecified: Secondary | ICD-10-CM | POA: Diagnosis not present

## 2023-10-16 DIAGNOSIS — E440 Moderate protein-calorie malnutrition: Secondary | ICD-10-CM | POA: Diagnosis not present

## 2023-10-16 DIAGNOSIS — D6851 Activated protein C resistance: Secondary | ICD-10-CM | POA: Diagnosis not present

## 2023-10-16 DIAGNOSIS — K219 Gastro-esophageal reflux disease without esophagitis: Secondary | ICD-10-CM | POA: Diagnosis not present

## 2023-10-16 DIAGNOSIS — M810 Age-related osteoporosis without current pathological fracture: Secondary | ICD-10-CM | POA: Diagnosis not present

## 2023-10-16 DIAGNOSIS — H53143 Visual discomfort, bilateral: Secondary | ICD-10-CM | POA: Diagnosis not present

## 2023-10-16 DIAGNOSIS — Z48815 Encounter for surgical aftercare following surgery on the digestive system: Secondary | ICD-10-CM | POA: Diagnosis not present

## 2023-10-16 DIAGNOSIS — K449 Diaphragmatic hernia without obstruction or gangrene: Secondary | ICD-10-CM | POA: Diagnosis not present

## 2023-10-16 DIAGNOSIS — F0393 Unspecified dementia, unspecified severity, with mood disturbance: Secondary | ICD-10-CM | POA: Diagnosis not present

## 2023-10-16 DIAGNOSIS — Z96651 Presence of right artificial knee joint: Secondary | ICD-10-CM | POA: Diagnosis not present

## 2023-10-16 DIAGNOSIS — N189 Chronic kidney disease, unspecified: Secondary | ICD-10-CM | POA: Diagnosis not present

## 2023-10-16 DIAGNOSIS — F0394 Unspecified dementia, unspecified severity, with anxiety: Secondary | ICD-10-CM | POA: Diagnosis not present

## 2023-10-16 DIAGNOSIS — E785 Hyperlipidemia, unspecified: Secondary | ICD-10-CM | POA: Diagnosis not present

## 2023-10-19 DIAGNOSIS — N1831 Chronic kidney disease, stage 3a: Secondary | ICD-10-CM | POA: Diagnosis not present

## 2023-10-19 DIAGNOSIS — Z8711 Personal history of peptic ulcer disease: Secondary | ICD-10-CM | POA: Diagnosis not present

## 2023-10-19 DIAGNOSIS — I129 Hypertensive chronic kidney disease with stage 1 through stage 4 chronic kidney disease, or unspecified chronic kidney disease: Secondary | ICD-10-CM | POA: Diagnosis not present

## 2023-10-20 DIAGNOSIS — M810 Age-related osteoporosis without current pathological fracture: Secondary | ICD-10-CM | POA: Diagnosis not present

## 2023-10-20 DIAGNOSIS — K449 Diaphragmatic hernia without obstruction or gangrene: Secondary | ICD-10-CM | POA: Diagnosis not present

## 2023-10-20 DIAGNOSIS — F32A Depression, unspecified: Secondary | ICD-10-CM | POA: Diagnosis not present

## 2023-10-20 DIAGNOSIS — Z96651 Presence of right artificial knee joint: Secondary | ICD-10-CM | POA: Diagnosis not present

## 2023-10-20 DIAGNOSIS — N189 Chronic kidney disease, unspecified: Secondary | ICD-10-CM | POA: Diagnosis not present

## 2023-10-20 DIAGNOSIS — G43909 Migraine, unspecified, not intractable, without status migrainosus: Secondary | ICD-10-CM | POA: Diagnosis not present

## 2023-10-20 DIAGNOSIS — F03918 Unspecified dementia, unspecified severity, with other behavioral disturbance: Secondary | ICD-10-CM | POA: Diagnosis not present

## 2023-10-20 DIAGNOSIS — K219 Gastro-esophageal reflux disease without esophagitis: Secondary | ICD-10-CM | POA: Diagnosis not present

## 2023-10-20 DIAGNOSIS — G4700 Insomnia, unspecified: Secondary | ICD-10-CM | POA: Diagnosis not present

## 2023-10-20 DIAGNOSIS — F0393 Unspecified dementia, unspecified severity, with mood disturbance: Secondary | ICD-10-CM | POA: Diagnosis not present

## 2023-10-20 DIAGNOSIS — E785 Hyperlipidemia, unspecified: Secondary | ICD-10-CM | POA: Diagnosis not present

## 2023-10-20 DIAGNOSIS — H53143 Visual discomfort, bilateral: Secondary | ICD-10-CM | POA: Diagnosis not present

## 2023-10-20 DIAGNOSIS — Z48815 Encounter for surgical aftercare following surgery on the digestive system: Secondary | ICD-10-CM | POA: Diagnosis not present

## 2023-10-20 DIAGNOSIS — E440 Moderate protein-calorie malnutrition: Secondary | ICD-10-CM | POA: Diagnosis not present

## 2023-10-20 DIAGNOSIS — D6851 Activated protein C resistance: Secondary | ICD-10-CM | POA: Diagnosis not present

## 2023-10-20 DIAGNOSIS — F0394 Unspecified dementia, unspecified severity, with anxiety: Secondary | ICD-10-CM | POA: Diagnosis not present

## 2023-10-22 DIAGNOSIS — Z96651 Presence of right artificial knee joint: Secondary | ICD-10-CM | POA: Diagnosis not present

## 2023-10-22 DIAGNOSIS — F32A Depression, unspecified: Secondary | ICD-10-CM | POA: Diagnosis not present

## 2023-10-22 DIAGNOSIS — M810 Age-related osteoporosis without current pathological fracture: Secondary | ICD-10-CM | POA: Diagnosis not present

## 2023-10-22 DIAGNOSIS — D6851 Activated protein C resistance: Secondary | ICD-10-CM | POA: Diagnosis not present

## 2023-10-22 DIAGNOSIS — N189 Chronic kidney disease, unspecified: Secondary | ICD-10-CM | POA: Diagnosis not present

## 2023-10-22 DIAGNOSIS — H53143 Visual discomfort, bilateral: Secondary | ICD-10-CM | POA: Diagnosis not present

## 2023-10-22 DIAGNOSIS — F03918 Unspecified dementia, unspecified severity, with other behavioral disturbance: Secondary | ICD-10-CM | POA: Diagnosis not present

## 2023-10-22 DIAGNOSIS — K219 Gastro-esophageal reflux disease without esophagitis: Secondary | ICD-10-CM | POA: Diagnosis not present

## 2023-10-22 DIAGNOSIS — Z48815 Encounter for surgical aftercare following surgery on the digestive system: Secondary | ICD-10-CM | POA: Diagnosis not present

## 2023-10-22 DIAGNOSIS — G4700 Insomnia, unspecified: Secondary | ICD-10-CM | POA: Diagnosis not present

## 2023-10-22 DIAGNOSIS — K449 Diaphragmatic hernia without obstruction or gangrene: Secondary | ICD-10-CM | POA: Diagnosis not present

## 2023-10-22 DIAGNOSIS — E785 Hyperlipidemia, unspecified: Secondary | ICD-10-CM | POA: Diagnosis not present

## 2023-10-22 DIAGNOSIS — E440 Moderate protein-calorie malnutrition: Secondary | ICD-10-CM | POA: Diagnosis not present

## 2023-10-22 DIAGNOSIS — G43909 Migraine, unspecified, not intractable, without status migrainosus: Secondary | ICD-10-CM | POA: Diagnosis not present

## 2023-10-22 DIAGNOSIS — F0393 Unspecified dementia, unspecified severity, with mood disturbance: Secondary | ICD-10-CM | POA: Diagnosis not present

## 2023-10-22 DIAGNOSIS — F0394 Unspecified dementia, unspecified severity, with anxiety: Secondary | ICD-10-CM | POA: Diagnosis not present

## 2023-10-27 DIAGNOSIS — E785 Hyperlipidemia, unspecified: Secondary | ICD-10-CM | POA: Diagnosis not present

## 2023-10-27 DIAGNOSIS — M810 Age-related osteoporosis without current pathological fracture: Secondary | ICD-10-CM | POA: Diagnosis not present

## 2023-10-27 DIAGNOSIS — F0394 Unspecified dementia, unspecified severity, with anxiety: Secondary | ICD-10-CM | POA: Diagnosis not present

## 2023-10-27 DIAGNOSIS — Z96651 Presence of right artificial knee joint: Secondary | ICD-10-CM | POA: Diagnosis not present

## 2023-10-27 DIAGNOSIS — K449 Diaphragmatic hernia without obstruction or gangrene: Secondary | ICD-10-CM | POA: Diagnosis not present

## 2023-10-27 DIAGNOSIS — E440 Moderate protein-calorie malnutrition: Secondary | ICD-10-CM | POA: Diagnosis not present

## 2023-10-27 DIAGNOSIS — G43909 Migraine, unspecified, not intractable, without status migrainosus: Secondary | ICD-10-CM | POA: Diagnosis not present

## 2023-10-27 DIAGNOSIS — D6851 Activated protein C resistance: Secondary | ICD-10-CM | POA: Diagnosis not present

## 2023-10-27 DIAGNOSIS — K219 Gastro-esophageal reflux disease without esophagitis: Secondary | ICD-10-CM | POA: Diagnosis not present

## 2023-10-27 DIAGNOSIS — G4700 Insomnia, unspecified: Secondary | ICD-10-CM | POA: Diagnosis not present

## 2023-10-27 DIAGNOSIS — H53143 Visual discomfort, bilateral: Secondary | ICD-10-CM | POA: Diagnosis not present

## 2023-10-27 DIAGNOSIS — F03918 Unspecified dementia, unspecified severity, with other behavioral disturbance: Secondary | ICD-10-CM | POA: Diagnosis not present

## 2023-10-27 DIAGNOSIS — Z48815 Encounter for surgical aftercare following surgery on the digestive system: Secondary | ICD-10-CM | POA: Diagnosis not present

## 2023-10-27 DIAGNOSIS — N189 Chronic kidney disease, unspecified: Secondary | ICD-10-CM | POA: Diagnosis not present

## 2023-10-27 DIAGNOSIS — F32A Depression, unspecified: Secondary | ICD-10-CM | POA: Diagnosis not present

## 2023-10-27 DIAGNOSIS — F0393 Unspecified dementia, unspecified severity, with mood disturbance: Secondary | ICD-10-CM | POA: Diagnosis not present

## 2023-11-02 DIAGNOSIS — Z1231 Encounter for screening mammogram for malignant neoplasm of breast: Secondary | ICD-10-CM | POA: Diagnosis not present

## 2023-11-04 ENCOUNTER — Ambulatory Visit: Payer: Medicare Other | Admitting: Adult Health

## 2023-11-04 DIAGNOSIS — G4700 Insomnia, unspecified: Secondary | ICD-10-CM | POA: Diagnosis not present

## 2023-11-04 DIAGNOSIS — Z96651 Presence of right artificial knee joint: Secondary | ICD-10-CM | POA: Diagnosis not present

## 2023-11-04 DIAGNOSIS — G43909 Migraine, unspecified, not intractable, without status migrainosus: Secondary | ICD-10-CM | POA: Diagnosis not present

## 2023-11-04 DIAGNOSIS — F0393 Unspecified dementia, unspecified severity, with mood disturbance: Secondary | ICD-10-CM | POA: Diagnosis not present

## 2023-11-04 DIAGNOSIS — F03918 Unspecified dementia, unspecified severity, with other behavioral disturbance: Secondary | ICD-10-CM | POA: Diagnosis not present

## 2023-11-04 DIAGNOSIS — K449 Diaphragmatic hernia without obstruction or gangrene: Secondary | ICD-10-CM | POA: Diagnosis not present

## 2023-11-04 DIAGNOSIS — F0394 Unspecified dementia, unspecified severity, with anxiety: Secondary | ICD-10-CM | POA: Diagnosis not present

## 2023-11-04 DIAGNOSIS — N189 Chronic kidney disease, unspecified: Secondary | ICD-10-CM | POA: Diagnosis not present

## 2023-11-04 DIAGNOSIS — M810 Age-related osteoporosis without current pathological fracture: Secondary | ICD-10-CM | POA: Diagnosis not present

## 2023-11-04 DIAGNOSIS — E440 Moderate protein-calorie malnutrition: Secondary | ICD-10-CM | POA: Diagnosis not present

## 2023-11-04 DIAGNOSIS — E785 Hyperlipidemia, unspecified: Secondary | ICD-10-CM | POA: Diagnosis not present

## 2023-11-04 DIAGNOSIS — Z48815 Encounter for surgical aftercare following surgery on the digestive system: Secondary | ICD-10-CM | POA: Diagnosis not present

## 2023-11-04 DIAGNOSIS — H53143 Visual discomfort, bilateral: Secondary | ICD-10-CM | POA: Diagnosis not present

## 2023-11-04 DIAGNOSIS — D6851 Activated protein C resistance: Secondary | ICD-10-CM | POA: Diagnosis not present

## 2023-11-04 DIAGNOSIS — F32A Depression, unspecified: Secondary | ICD-10-CM | POA: Diagnosis not present

## 2023-11-04 DIAGNOSIS — K219 Gastro-esophageal reflux disease without esophagitis: Secondary | ICD-10-CM | POA: Diagnosis not present

## 2023-11-05 ENCOUNTER — Ambulatory Visit: Admitting: Adult Health

## 2023-11-05 ENCOUNTER — Encounter: Payer: Self-pay | Admitting: Adult Health

## 2023-11-05 VITALS — BP 129/68 | HR 80 | Wt 124.0 lb

## 2023-11-05 DIAGNOSIS — F03C11 Unspecified dementia, severe, with agitation: Secondary | ICD-10-CM | POA: Diagnosis not present

## 2023-11-05 MED ORDER — RIVASTIGMINE 9.5 MG/24HR TD PT24
9.5000 mg | MEDICATED_PATCH | Freq: Every day | TRANSDERMAL | 12 refills | Status: AC
Start: 2023-11-05 — End: ?

## 2023-11-05 NOTE — Patient Instructions (Addendum)
 Your Plan:  Continue Exelon transdermal patch - will increase to 9.5mg /day to further help slow cognitive decline -  ensure you are replacing every night to ensure the patch is working correctly   Ensure routine physical and cognitive exercises as well as ensuring adequate sleep and healthy diet     Follow-up in 6 months or call earlier if needed     Thank you for coming to see [place] at [facility]. I hope we have been able to provide you high quality care today.  You may receive a patient satisfaction survey over the next few weeks. We would appreciate your feedback and comments so that we may continue to improve ourselves and the health of our patients.

## 2023-11-05 NOTE — Progress Notes (Signed)
 [facility]  PATIENT: Samantha Ali DOB: 1946-04-23  PRIMARY NEUROLOGIST: Dr. Marjory Lies REFERRING CLINICIAN: Sigmund Hazel, MD HISTORY FROM: patient and husband REASON FOR VISIT: Memory loss   HISTORICAL  CHIEF COMPLAINT:  Chief Complaint  Patient presents with   Memory Loss    Rm 8 with spouse Pt is well and stable, pt reports memory and cognition is the same. No new concerns.     HISTORY OF PRESENT ILLNESS:   Update 11/05/2023 JM: Patient returns for follow-up visit accompanied by her husband.  Reports overall cognition stable since prior visit. MMSE today 11/30 (prior 15/30).   Remains on Exelon 4.6mg  transdermal patch, tolerating well, some nights patient will refuse to allow husband to place new patch but eventually will let him.  Can have occasional agitation but otherwise no significant behavioral concerns.  She continues to hide objects around the home.  Reports she sleeps well.  Relatively good appetite.  No questions or concerns at this time.      History provided for reference purposes only Update 04/14/2023 JM: Patient returns for follow-up visit accompanied by her husband.  Started on Exelon transdermal patches at prior visit.  Believes memory stable. Continues to maintain ADL's independently.  Husband assist with IADLs.  Denies behavioral concerns. Appetite has been good. Sleeping well, has not needed trazodone (rx'd by PCP).  Tries to stay active at home but overall relatively sedentary.  No routine physical or mental activities.  MMSE today 15/30 (17/30). Is tearful today during visit due to continued memory difficulties and that fact that there is no cure.   Update 10/08/2022 JM: Returns for follow-up visit accompanied by her husband.  Was started on Namenda at prior visit but had difficulty tolerating therefore discontinued.  Reports cognition has been relatively stable, has good days and bad days. Does get frustrated easily with memory loss, such as  forgetting where she places items. Per husband, she will frequently misplace items such as her purse, checkbook, phone, etc and become very upset when she and her husband cannot locate these items. Husband has been trying to work on ensuring she keeps these items in the same place over the past week.  She does admit to being sedentary, does not do any type of physical activity or mental stimulation. Sleeping well. Appetite good for the most part.  MMSE today 17/30 (prior 15/30), becomes tearful during memory testing due to frustration with missing questions.   Update 06/02/2022 JM: Patient returns per request due to concerns of worsening memory accompanied by her husband. Feels like her memory has been a gradual decline since prior visit, can have good days and bad days.  More issues with short-term memory and at times delayed recall.  Husband believes memory has more acutely declined over the past 2 to 3 weeks.  Was recently seen by PCP, denies any work-up at that time for acute decline.  Has been experiencing increased urinary frequency over the past couple of weeks, denies any painful urination or bloody urine.  She is still able to maintain all of her ADLs, able to do light cleaning, husband assists with cooking and bill paying.  Husband questions use of medications, reports previously trying donepezil and had difficulty tolerating, denies previously trying memantine.   Consult visit 04/24/2020 Dr. Marjory Lies: 78 year old female here for evaluation memory loss. Patient has had 6 months of mild short-term memory loss, losing things, forgetting recent conversations. No major changes in ADLs. She takes care of her own personal  hygiene, bathing, dressing, feeding, household chores. She still able to drive, shop, manage finances and operate independently outside the home. She was empirically tried on donepezil and memantine but could not tolerate these because of side effects.   REVIEW OF SYSTEMS: Full 14 system  review of systems performed and negative with exception of: As per HPI.  ALLERGIES: Allergies  Allergen Reactions   Aricept [Donepezil]     Bad dreams   Nsaids     Perf duodenal ulcer    HOME MEDICATIONS: Outpatient Medications Prior to Visit  Medication Sig Dispense Refill   acetaminophen (TYLENOL) 500 MG tablet Take 2 tablets (1,000 mg total) by mouth every 6 (six) hours as needed for mild pain (pain score 1-3) or fever.     amLODipine (NORVASC) 5 MG tablet Take 1 tablet (5 mg total) by mouth daily.     feeding supplement (ENSURE ENLIVE / ENSURE PLUS) LIQD Take 237 mLs by mouth 2 (two) times daily between meals.     methocarbamol (ROBAXIN) 500 MG tablet Take 1 tablet (500 mg total) by mouth every 6 (six) hours as needed for muscle spasms.     oxyCODONE (OXY IR/ROXICODONE) 5 MG immediate release tablet Take 0.5-1 tablets (2.5-5 mg total) by mouth every 4 (four) hours as needed for moderate pain (pain score 4-6) or severe pain (pain score 7-10). 30 tablet 0   pantoprazole (PROTONIX) 40 MG tablet Take 1 tablet (40 mg total) by mouth 2 (two) times daily for 42 days, THEN 1 tablet (40 mg total) daily.     Salicylic Acid 50 % SOLN Apply 1 application  topically See admin instructions. Apply topically to affected area(s) at bedtime     sertraline (ZOLOFT) 25 MG tablet Take 25 mg by mouth daily.     rivastigmine (EXELON) 4.6 mg/24hr Place 1 patch (4.6 mg total) onto the skin daily. 30 patch 2   No facility-administered medications prior to visit.    PAST MEDICAL HISTORY: Past Medical History:  Diagnosis Date   Arthritis    R knee   Breast cancer (HCC) 2004   chemo   Cancer (HCC)    CKD (chronic kidney disease)    Clot 1996   in the right leg- in the muscle     DVT (deep venous thrombosis) (HCC) 1990's   probably related to positive phlebitis   Factor 5 Leiden mutation, heterozygous (HCC) 11/21/2013   GERD (gastroesophageal reflux disease)    Headache(784.0)    migraines- most  often 2-3 times /week    Heterozygous factor V Leiden mutation (HCC) 01/16/2014   Hyperlipidemia    Insomnia    Migraine    Osteoporosis    Photophobia of both eyes    unknown etiology    PAST SURGICAL HISTORY: Past Surgical History:  Procedure Laterality Date   BREAST LUMPECTOMY  02/21/2003   BREAST SURGERY  1987   right lumpectomy for benign disease   EYE SURGERY     cataracts removed -IOL   KNEE SURGERY     LAPAROSCOPY N/A 09/30/2023   Procedure: LAPAROSCOPIC [facility];  Surgeon: Quentin Ore, MD;  Location: [facility] OR;  Service: General;  Laterality: N/A;   MASTECTOMY Left 2004   L parial mastectomy   SENTINEL LYMPH NODE BIOPSY  02/21/2003   SHOULDER SURGERY Right 03/2010   rotator cuff   TONSILLECTOMY     TOTAL KNEE ARTHROPLASTY Right 08/09/2013   Procedure: TOTAL KNEE ARTHROPLASTY;  Surgeon: Velna Ochs, MD;  Location: [facility] OR;  Service: Orthopedics;  Laterality: Right;   TUBAL LIGATION      FAMILY HISTORY: Family History  Problem Relation Age of Onset   Heart disease Father     SOCIAL HISTORY: Social History   Socioeconomic History   Marital status: Married    Spouse name: Molly Maduro   Number of children: 1   Years of education: 12   Highest education level: Not on file  Occupational History   Occupation: collections  Tobacco Use   Smoking status: Never   Smokeless tobacco: Never  Substance and Sexual Activity   Alcohol use: Never    Alcohol/week: 0.0 standard drinks of alcohol   Drug use: Never   Sexual activity: Not on file  Other Topics Concern   Not on file  Social History Narrative   Lives with husband   No caffiene   Social Drivers of Corporate investment banker Strain: Not on file  Food Insecurity: No Food Insecurity (09/30/2023)   Hunger Vital Sign    Worried About Running Out of Food in the Last Year: Never true    Ran Out of Food in the Last Year: Never true  Transportation Needs: No Transportation Needs (09/30/2023)    PRAPARE - Administrator, Civil Service (Medical): No    Lack of Transportation (Non-Medical): No  Physical Activity: Not on file  Stress: Not on file  Social Connections: Unknown (09/30/2023)   Social Connection and Isolation Panel [NHANES]    Frequency of Communication with Friends and Family: Patient unable to answer    Frequency of Social Gatherings with Friends and Family: Patient unable to answer    Attends Religious Services: Patient declined    Active Member of Clubs or Organizations: Patient unable to answer    Attends Banker Meetings: Patient unable to answer    Marital Status: Patient unable to answer  Intimate Partner Violence: Not At Risk (09/30/2023)   Humiliation, Afraid, Rape, and Kick questionnaire    Fear of Current or Ex-Partner: No    Emotionally Abused: No    Physically Abused: No    Sexually Abused: No     PHYSICAL EXAM  GENERAL EXAM/CONSTITUTIONAL: Vitals:  Vitals:   11/05/23 1248  BP: 129/68  Pulse: 80  Weight: 124 lb (56.2 kg)       Body mass index is 21.97 kg/m. Wt Readings from Last 3 Encounters:  11/05/23 124 lb (56.2 kg)  10/07/23 134 lb 0.6 oz (60.8 kg)  09/28/23 130 lb (59 kg)   Patient is in no distress; well developed, nourished and groomed; neck is supple  CARDIOVASCULAR: Examination of carotid arteries is normal; no carotid bruits Regular rate and rhythm, no murmurs Examination of peripheral vascular system by observation and palpation is normal  MUSCULOSKELETAL: Gait, strength, tone, movements noted in Neurologic exam below  NEUROLOGIC: MENTAL STATUS:     11/05/2023   12:48 PM 04/14/2023    2:54 PM 10/08/2022    3:30 PM  MMSE - Mini Mental State Exam  Orientation to time 0 1 4  Orientation to Place 3 5 5   Registration 3 3 3   Attention/ Calculation 0 1 1  Recall 0 0 0  Language- name 2 objects 1 2 2   Language- repeat 1 1 1   Language- follow 3 step command 2 1 0  Language- read & follow direction  1 1 1   Write a sentence 0 0 0  Copy design 0 0 0  Total  score 11 15 17    awake, alert recent memory impaired and remote memory intact normal attention and concentration language fluent, comprehension intact, naming intact fund of knowledge appropriate  CRANIAL NERVE:  2nd - no papilledema on fundoscopic exam 2nd, 3rd, 4th, 6th - pupils equal and reactive to light, visual fields full to confrontation, extraocular muscles intact, no nystagmus 5th - facial sensation symmetric 7th - facial strength symmetric 8th - hearing intact 9th - palate elevates symmetrically, uvula midline 11th - shoulder shrug symmetric 12th - tongue protrusion midline  MOTOR:  normal bulk and tone, full strength in the BUE, BLE  SENSORY:  normal and symmetric to light touch, temperature, vibration  COORDINATION:  finger-nose-finger, fine finger movements normal  REFLEXES:  deep tendon reflexes present and symmetric  GAIT/STATION:  narrow based gait     DIAGNOSTIC DATA (LABS, IMAGING, TESTING) - I reviewed patient records, labs, notes, testing and imaging myself where available.  Lab Results  Component Value Date   WBC 9.9 10/05/2023   HGB 10.8 (L) 10/05/2023   HCT 32.6 (L) 10/05/2023   MCV 92.6 10/05/2023   PLT 277 10/05/2023      Component Value Date/Time   NA 138 10/05/2023 0727   NA 142 06/02/2022 1351   NA 143 05/30/2013 1040   K 3.9 10/05/2023 0727   K 4.6 05/30/2013 1040   CL 106 10/05/2023 0727   CL 105 05/05/2012 1058   CO2 26 10/05/2023 0727   CO2 27 05/30/2013 1040   GLUCOSE 96 10/05/2023 0727   GLUCOSE 98 05/30/2013 1040   GLUCOSE 93 05/05/2012 1058   BUN 21 10/05/2023 0727   BUN 9 06/02/2022 1351   BUN 14.4 05/30/2013 1040   CREATININE 1.03 (H) 10/05/2023 0727   CREATININE 1.1 05/30/2013 1040   CALCIUM 8.8 (L) 10/05/2023 0727   CALCIUM 10.1 05/30/2013 1040   PROT 5.6 (L) 10/01/2023 0535   PROT 7.1 06/02/2022 1351   PROT 7.7 05/30/2013 1040   ALBUMIN 2.8 (L)  10/01/2023 0535   ALBUMIN 4.8 06/02/2022 1351   ALBUMIN 3.8 05/30/2013 1040   AST 16 10/01/2023 0535   AST 26 05/30/2013 1040   ALT 18 10/01/2023 0535   ALT 22 05/30/2013 1040   ALKPHOS 64 10/01/2023 0535   ALKPHOS 88 05/30/2013 1040   BILITOT 0.5 10/01/2023 0535   BILITOT 0.5 06/02/2022 1351   BILITOT 0.59 05/30/2013 1040   GFRNONAA 56 (L) 10/05/2023 0727   GFRAA 72 (L) 11/15/2013 0630   Lab Results  Component Value Date   CHOL 150 05/27/2010   HDL 50 05/27/2010   LDLCALC 87 05/27/2010   TRIG 56 09/30/2023   CHOLHDL 3.0 05/27/2010   Lab Results  Component Value Date   HGBA1C 5.6 10/02/2023   Lab Results  Component Value Date   VITAMINB12 820 06/02/2022   Lab Results  Component Value Date   TSH 2.020 06/02/2022     03/08/19 MRI brain - Mild biparietal atrophy.  - Otherwise normal.      ASSESSMENT AND PLAN  78 y.o. year old female here with mild memory loss since early 2021. No major changes in ADLs. Could represent mild cognitive impairment.   Dx:  No diagnosis found.    PLAN:  MODERATE MEMORY LOSS -MMSE today 11/30 (prior 15/30) -Increase Exelon to 49.5 mg transdermal patch daily -Difficulty tolerating memantine and donepezil -Discussed importance of increasing physical activity and mental stimulation and social activities, ensuring adequate sleep and healthy diet    Return  in about 6 months (around 05/06/2024).   I spent 30 minutes of face-to-face and non-face-to-face time with patient and husband.  This included previsit chart review, lab review, study review, order entry, electronic health record documentation, patient and husband education and discussion regarding above diagnoses and treatment plan and answered all other questions to patient and husband satisfaction  Ihor Austin, AGNP-BC  [facility] [address] [place], [place] [address]  Phone [phone] Fax [phone] Note: This document was  prepared with digital dictation and possible smart phrase technology. Any transcriptional errors that result from this process are unintentional.

## 2023-11-11 DIAGNOSIS — K449 Diaphragmatic hernia without obstruction or gangrene: Secondary | ICD-10-CM | POA: Diagnosis not present

## 2023-11-11 DIAGNOSIS — H53143 Visual discomfort, bilateral: Secondary | ICD-10-CM | POA: Diagnosis not present

## 2023-11-11 DIAGNOSIS — G4700 Insomnia, unspecified: Secondary | ICD-10-CM | POA: Diagnosis not present

## 2023-11-11 DIAGNOSIS — E440 Moderate protein-calorie malnutrition: Secondary | ICD-10-CM | POA: Diagnosis not present

## 2023-11-11 DIAGNOSIS — K219 Gastro-esophageal reflux disease without esophagitis: Secondary | ICD-10-CM | POA: Diagnosis not present

## 2023-11-11 DIAGNOSIS — Z86718 Personal history of other venous thrombosis and embolism: Secondary | ICD-10-CM | POA: Diagnosis not present

## 2023-11-11 DIAGNOSIS — F03918 Unspecified dementia, unspecified severity, with other behavioral disturbance: Secondary | ICD-10-CM | POA: Diagnosis not present

## 2023-11-11 DIAGNOSIS — D6851 Activated protein C resistance: Secondary | ICD-10-CM | POA: Diagnosis not present

## 2023-11-11 DIAGNOSIS — Z48815 Encounter for surgical aftercare following surgery on the digestive system: Secondary | ICD-10-CM | POA: Diagnosis not present

## 2023-11-11 DIAGNOSIS — F0394 Unspecified dementia, unspecified severity, with anxiety: Secondary | ICD-10-CM | POA: Diagnosis not present

## 2023-11-11 DIAGNOSIS — E785 Hyperlipidemia, unspecified: Secondary | ICD-10-CM | POA: Diagnosis not present

## 2023-11-11 DIAGNOSIS — F32A Depression, unspecified: Secondary | ICD-10-CM | POA: Diagnosis not present

## 2023-11-11 DIAGNOSIS — Z853 Personal history of malignant neoplasm of breast: Secondary | ICD-10-CM | POA: Diagnosis not present

## 2023-11-11 DIAGNOSIS — F0393 Unspecified dementia, unspecified severity, with mood disturbance: Secondary | ICD-10-CM | POA: Diagnosis not present

## 2023-11-11 DIAGNOSIS — G43909 Migraine, unspecified, not intractable, without status migrainosus: Secondary | ICD-10-CM | POA: Diagnosis not present

## 2023-11-11 DIAGNOSIS — M810 Age-related osteoporosis without current pathological fracture: Secondary | ICD-10-CM | POA: Diagnosis not present

## 2023-11-11 DIAGNOSIS — N189 Chronic kidney disease, unspecified: Secondary | ICD-10-CM | POA: Diagnosis not present

## 2023-11-11 DIAGNOSIS — Z9181 History of falling: Secondary | ICD-10-CM | POA: Diagnosis not present

## 2023-11-11 DIAGNOSIS — Z96651 Presence of right artificial knee joint: Secondary | ICD-10-CM | POA: Diagnosis not present

## 2023-11-18 DIAGNOSIS — F32A Depression, unspecified: Secondary | ICD-10-CM | POA: Diagnosis not present

## 2023-11-18 DIAGNOSIS — Z48815 Encounter for surgical aftercare following surgery on the digestive system: Secondary | ICD-10-CM | POA: Diagnosis not present

## 2023-11-18 DIAGNOSIS — D6851 Activated protein C resistance: Secondary | ICD-10-CM | POA: Diagnosis not present

## 2023-11-18 DIAGNOSIS — E440 Moderate protein-calorie malnutrition: Secondary | ICD-10-CM | POA: Diagnosis not present

## 2023-11-18 DIAGNOSIS — F0393 Unspecified dementia, unspecified severity, with mood disturbance: Secondary | ICD-10-CM | POA: Diagnosis not present

## 2023-11-18 DIAGNOSIS — M810 Age-related osteoporosis without current pathological fracture: Secondary | ICD-10-CM | POA: Diagnosis not present

## 2023-11-18 DIAGNOSIS — F03918 Unspecified dementia, unspecified severity, with other behavioral disturbance: Secondary | ICD-10-CM | POA: Diagnosis not present

## 2023-11-18 DIAGNOSIS — K449 Diaphragmatic hernia without obstruction or gangrene: Secondary | ICD-10-CM | POA: Diagnosis not present

## 2023-11-18 DIAGNOSIS — G43909 Migraine, unspecified, not intractable, without status migrainosus: Secondary | ICD-10-CM | POA: Diagnosis not present

## 2023-11-18 DIAGNOSIS — E785 Hyperlipidemia, unspecified: Secondary | ICD-10-CM | POA: Diagnosis not present

## 2023-11-18 DIAGNOSIS — N189 Chronic kidney disease, unspecified: Secondary | ICD-10-CM | POA: Diagnosis not present

## 2023-11-18 DIAGNOSIS — F0394 Unspecified dementia, unspecified severity, with anxiety: Secondary | ICD-10-CM | POA: Diagnosis not present

## 2023-11-18 DIAGNOSIS — G4700 Insomnia, unspecified: Secondary | ICD-10-CM | POA: Diagnosis not present

## 2023-11-18 DIAGNOSIS — H53143 Visual discomfort, bilateral: Secondary | ICD-10-CM | POA: Diagnosis not present

## 2023-11-18 DIAGNOSIS — Z96651 Presence of right artificial knee joint: Secondary | ICD-10-CM | POA: Diagnosis not present

## 2023-11-18 DIAGNOSIS — K219 Gastro-esophageal reflux disease without esophagitis: Secondary | ICD-10-CM | POA: Diagnosis not present

## 2023-12-04 DIAGNOSIS — M170 Bilateral primary osteoarthritis of knee: Secondary | ICD-10-CM | POA: Diagnosis not present

## 2023-12-04 DIAGNOSIS — M1712 Unilateral primary osteoarthritis, left knee: Secondary | ICD-10-CM | POA: Diagnosis not present

## 2023-12-04 DIAGNOSIS — M1711 Unilateral primary osteoarthritis, right knee: Secondary | ICD-10-CM | POA: Diagnosis not present

## 2023-12-22 ENCOUNTER — Ambulatory Visit: Payer: Medicare Other | Admitting: Diagnostic Neuroimaging

## 2023-12-29 ENCOUNTER — Telehealth: Payer: Self-pay | Admitting: Adult Health

## 2023-12-29 DIAGNOSIS — F03C11 Unspecified dementia, severe, with agitation: Secondary | ICD-10-CM

## 2023-12-29 NOTE — Telephone Encounter (Signed)
 Spouse(on DPR) states pt woke up in a state f confusion, unaware of who her spouse was, stating everything was out of place, he is asking for a medication to ease anxiety, depression and mood swing for pt.

## 2023-12-30 ENCOUNTER — Telehealth: Payer: Self-pay

## 2023-12-30 ENCOUNTER — Other Ambulatory Visit (HOSPITAL_COMMUNITY): Payer: Self-pay

## 2023-12-30 MED ORDER — REXULTI 0.5 MG PO TABS
0.5000 mg | ORAL_TABLET | Freq: Every day | ORAL | 11 refills | Status: DC
Start: 2023-12-30 — End: 2024-05-19

## 2023-12-30 NOTE — Telephone Encounter (Signed)
 Cld Pt husband to make him aware of the new script sent to pharmacy for Pt. Explained med is to help with agitation that is associated with dementia. Husband voiced understanding and thankful for the call.

## 2023-12-30 NOTE — Telephone Encounter (Signed)
 Pharmacy Patient Advocate Encounter  Received notification from [facility] that Prior Authorization for Rexulti 0.5MG  tablets has been APPROVED from 12/30/2023 to 12/29/2024   PA #/Case ID/Reference #: N/A

## 2023-12-30 NOTE — Telephone Encounter (Signed)
 Pharmacy Patient Advocate Encounter   Received notification from CoverMyMeds that prior authorization for Rexulti 0.5MG  tablets is required/requested.   Insurance verification completed.   The patient is insured through [facility] .   Per test claim: PA required; PA submitted to above mentioned insurance via CoverMyMeds Key/confirmation #/EOC [id number] Status is pending

## 2023-12-30 NOTE — Telephone Encounter (Signed)
 Recommend initiating Rexulti 0.5 mg daily to help with agitation associated with dementia. Please call after 1-2 weeks if symptoms persist for dosage increase.

## 2023-12-30 NOTE — Telephone Encounter (Signed)
 Tonia @ [employer] has called with approval for this medication 12/30/23-12/29/24 if there are questions call back # is [phone] option 5

## 2023-12-30 NOTE — Telephone Encounter (Signed)
 Spoke w/Pt husband regarding Pt confusion. Husband stated Pt has also has episodes of wandering around the house looking for things and gets very frustrated with him when he can't help her find it and she is unable to tell him what she is looking for so her frustration increases. Husband states Pt had a duodenal ulcer repair in March and was in the hospital for 8 days. Discussed that changes in regular routine, especially with a hospitalization, can sometimes cause a progression with dementia especially when Pt had anesthesia. Also discussed dementia progression as a whole causing more anxiety and frustration w/Pt and caregiver. Husband stated he tries to keep the daily routine up as much as possible and does not leave Pt home alone as she has wandered away from home in the past. They do not really have anyone who can provide respite for him. Encouraged husband to reach out to the [facility] for resources for CG support groups as well as respite volunteers. Husband asked if there is any medication Pt can be prescribed to help with the anxiety and mood swings. Also stated understands if there is nothing as sometimes medications make things worse. Informed husband will message provider for recommendations. Husband voiced understanding and thankful for the call back.

## 2024-01-07 DIAGNOSIS — F0390 Unspecified dementia without behavioral disturbance: Secondary | ICD-10-CM | POA: Diagnosis not present

## 2024-01-07 DIAGNOSIS — Z8711 Personal history of peptic ulcer disease: Secondary | ICD-10-CM | POA: Diagnosis not present

## 2024-01-07 DIAGNOSIS — Z23 Encounter for immunization: Secondary | ICD-10-CM | POA: Diagnosis not present

## 2024-01-07 DIAGNOSIS — I129 Hypertensive chronic kidney disease with stage 1 through stage 4 chronic kidney disease, or unspecified chronic kidney disease: Secondary | ICD-10-CM | POA: Diagnosis not present

## 2024-01-07 DIAGNOSIS — Z0000 Encounter for general adult medical examination without abnormal findings: Secondary | ICD-10-CM | POA: Diagnosis not present

## 2024-01-07 DIAGNOSIS — N1831 Chronic kidney disease, stage 3a: Secondary | ICD-10-CM | POA: Diagnosis not present

## 2024-02-01 ENCOUNTER — Other Ambulatory Visit (HOSPITAL_BASED_OUTPATIENT_CLINIC_OR_DEPARTMENT_OTHER): Payer: Self-pay

## 2024-02-01 MED ORDER — REXULTI 0.5 MG PO TABS
0.5000 mg | ORAL_TABLET | Freq: Every day | ORAL | 11 refills | Status: DC
  Filled 2024-02-01: qty 30, 30d supply, fill #0
  Filled 2024-03-02: qty 30, 30d supply, fill #1

## 2024-02-22 DIAGNOSIS — M1712 Unilateral primary osteoarthritis, left knee: Secondary | ICD-10-CM | POA: Diagnosis not present

## 2024-02-28 ENCOUNTER — Encounter (HOSPITAL_BASED_OUTPATIENT_CLINIC_OR_DEPARTMENT_OTHER): Payer: Self-pay

## 2024-02-28 ENCOUNTER — Emergency Department (HOSPITAL_BASED_OUTPATIENT_CLINIC_OR_DEPARTMENT_OTHER)

## 2024-02-28 ENCOUNTER — Other Ambulatory Visit: Payer: Self-pay

## 2024-02-28 ENCOUNTER — Emergency Department (HOSPITAL_BASED_OUTPATIENT_CLINIC_OR_DEPARTMENT_OTHER)
Admission: EM | Admit: 2024-02-28 | Discharge: 2024-02-28 | Disposition: A | Discharge status: Home / Self Care | Attending: Emergency Medicine | Admitting: Emergency Medicine

## 2024-02-28 DIAGNOSIS — Z853 Personal history of malignant neoplasm of breast: Secondary | ICD-10-CM | POA: Diagnosis not present

## 2024-02-28 DIAGNOSIS — R2242 Localized swelling, mass and lump, left lower limb: Secondary | ICD-10-CM | POA: Diagnosis not present

## 2024-02-28 DIAGNOSIS — M25572 Pain in left ankle and joints of left foot: Secondary | ICD-10-CM | POA: Diagnosis not present

## 2024-02-28 DIAGNOSIS — R600 Localized edema: Secondary | ICD-10-CM | POA: Insufficient documentation

## 2024-02-28 DIAGNOSIS — M7989 Other specified soft tissue disorders: Secondary | ICD-10-CM

## 2024-02-28 DIAGNOSIS — M19072 Primary osteoarthritis, left ankle and foot: Secondary | ICD-10-CM | POA: Diagnosis not present

## 2024-02-28 DIAGNOSIS — M79672 Pain in left foot: Secondary | ICD-10-CM | POA: Diagnosis not present

## 2024-02-28 MED ORDER — PREDNISONE 50 MG PO TABS: 60.0000 mg | ORAL_TABLET | Freq: Once | ORAL | Status: DC

## 2024-02-28 MED ORDER — METHYLPREDNISOLONE 4 MG PO TBPK: ORAL_TABLET | ORAL | 0 refills | Status: DC

## 2024-02-28 MED ORDER — DEXAMETHASONE SODIUM PHOSPHATE 4 MG/ML IJ SOLN
4.0000 mg | Freq: Once | INTRAMUSCULAR | Status: AC
  Administered 2024-02-28: 4 mg via INTRAMUSCULAR
  Filled 2024-02-28: qty 1

## 2024-02-28 NOTE — ED Notes (Signed)
 Pt d/c instructions, medications, and follow-up care reviewed with pt and family. Pt verbalized understanding and had no further questions at time of d/c. Pt CA&Ox4, ambulatory, and in NAD at time of d/c

## 2024-02-28 NOTE — Discharge Instructions (Addendum)
 Samantha Ali was seen in the ED today for Left foot swelling. And xray of the the foot and ankle was performed along with a DVT study. These were all negative. You have likely sprained your ankle. You make continue to take tylenol  for pain. Avoid NSAIDs due to your hisotry of a perforated ulcer. You were discharged with: A medrol  dosepak: follow the instructions as written. Return to the ED if you notice bilateral leg swelling, increasing pain, or other concerns.  Thank you for allowing us  to be a part of your care.

## 2024-02-28 NOTE — ED Triage Notes (Signed)
 Pt reports L foot swelling starting yesterday. Pt reports hx of DVT's. Pt not on any blood thinners.

## 2024-02-28 NOTE — ED Provider Notes (Signed)
 [place] EMERGENCY DEPARTMENT AT [facility] Provider Note   CSN: [id number] Arrival date & time: 02/28/24  1127     Patient presents with: Foot Swelling   Samantha Ali is a 78 y.o. female with PMH Breast cancer, palpitations, DVT, factor V Leiden mutation, and perforated ulcer.  The patient presents today with unilateral left foot swelling. Her husband provides most of the history. They report that she has difficultly ambulating at baseline but that over the past few days her lower leg and foot have been hurting and they noticed swelling today. She does have a history of DVT and is concerned she may have another one. It is not painful when resting, but is painful with touch and ambulation. She does not have significant cardiac history. She denies fever, chills, SOB, orthopnea, and CP.         Prior to Admission medications   Medication Sig Start Date End Date Taking? Authorizing Provider  acetaminophen  (TYLENOL ) 500 MG tablet Take 2 tablets (1,000 mg total) by mouth every 6 (six) hours as needed for mild pain (pain score 1-3) or fever. 10/06/23   Vicci Burnard SAUNDERS, PA-C  amLODipine  (NORVASC ) 5 MG tablet Take 1 tablet (5 mg total) by mouth daily. 10/06/23   Pokhrel, Laxman, MD  Brexpiprazole  (REXULTI ) 0.5 MG TABS Take 1 tablet (0.5 mg total) by mouth daily. 12/30/23   Whitfield Raisin, NP  Brexpiprazole  (REXULTI ) 0.5 MG TABS Take 1 tablet (0.5 mg total) by mouth daily. 12/30/23   Whitfield Raisin, NP  feeding supplement (ENSURE ENLIVE / ENSURE PLUS) LIQD Take 237 mLs by mouth 2 (two) times daily between meals. 10/06/23   Vicci Burnard SAUNDERS, PA-C  methocarbamol  (ROBAXIN ) 500 MG tablet Take 1 tablet (500 mg total) by mouth every 6 (six) hours as needed for muscle spasms. 10/06/23   Vicci Burnard SAUNDERS, PA-C  oxyCODONE  (OXY IR/ROXICODONE ) 5 MG immediate release tablet Take 0.5-1 tablets (2.5-5 mg total) by mouth every 4 (four) hours as needed for moderate pain (pain score 4-6) or severe pain (pain  score 7-10). 10/06/23   Vicci Burnard SAUNDERS, PA-C  pantoprazole  (PROTONIX ) 40 MG tablet Take 1 tablet (40 mg total) by mouth 2 (two) times daily for 42 days, THEN 1 tablet (40 mg total) daily. 10/07/23 11/17/24  Vicci Burnard SAUNDERS, PA-C  rivastigmine  (EXELON ) 9.5 mg/24hr Place 1 patch (9.5 mg total) onto the skin daily. 11/05/23   Whitfield Raisin, NP  Salicylic Acid 50 % SOLN Apply 1 application  topically See admin instructions. Apply topically to affected area(s) at bedtime    [provider]  sertraline  (ZOLOFT ) 25 MG tablet Take 25 mg by mouth daily.    [provider]    Allergies: Aricept [donepezil] and Nsaids    Review of Systems  Respiratory:  Negative for chest tightness and shortness of breath.   Cardiovascular:  Positive for leg swelling. Negative for chest pain.  Gastrointestinal:  Negative for abdominal pain, diarrhea, nausea and vomiting.  Neurological:  Negative for dizziness, light-headedness and headaches.    Updated Vital Signs BP (!) 152/73 (BP Location: Right Arm)   Pulse 80   Temp 98.6 F (37 C) (Oral)   Resp 18   Ht 5' 3 (1.6 m)   Wt 56 kg   SpO2 100%   BMI 21.87 kg/m   Physical Exam Constitutional:      General: She is not in acute distress.    Appearance: She is normal weight.  HENT:  Head: Normocephalic and atraumatic.  Cardiovascular:     Rate and Rhythm: Normal rate.  Pulmonary:     Effort: Pulmonary effort is normal. No respiratory distress.  Abdominal:     General: Abdomen is flat.     Palpations: Abdomen is soft.  Musculoskeletal:        General: Swelling and tenderness present. Normal range of motion.     Left lower leg: Edema present.     Comments: Pitting edema to L dorsal foot and distal shin. Tenderness to palpation with distal calf squeeze. Mild tenderness noted with passive dorsiflexion.   Neurological:     Mental Status: She is alert.     (all labs ordered are listed, but only abnormal results are displayed) Labs  Reviewed - No data to display  EKG: None  Radiology: No results found.                               Medical Decision Making Amount and/or Complexity of Data Reviewed Radiology: ordered.  Risk Prescription drug management.   LLE swelling A 77yo F presents with a 3 day history of foot pain and swelling. On exam there is pitting edema of the dorsal foot with tenderness to palpation in the distal shin. Mild pain with passive dorsiflexion of the left foot. There is no erythema noted. Xray of foot/ankle without acute osseous abnormality and generalized subcutaneous edema. DVT study negative. At this time, no concern for cardiogenic causes of peripheral edema. Suspect the patient likely has a L ankle sprain and is unable to recall mechanism of injury due to baseline dementia. Will discharge with medrol  dosepak to help with inflammation. Instructed pt to continue with tylenol  for pain and to avoid NSAIDs due to history of perforated ulcer. Pt stable for discharge at this time.    Final diagnoses:  None    ED Discharge Orders     None          Myrna Bitters, DO 02/28/24 1243    Ruthe Cornet, DO 02/28/24 1246

## 2024-03-02 ENCOUNTER — Other Ambulatory Visit (HOSPITAL_BASED_OUTPATIENT_CLINIC_OR_DEPARTMENT_OTHER): Payer: Self-pay

## 2024-03-09 DIAGNOSIS — M7989 Other specified soft tissue disorders: Secondary | ICD-10-CM | POA: Diagnosis not present

## 2024-03-14 DIAGNOSIS — M25572 Pain in left ankle and joints of left foot: Secondary | ICD-10-CM | POA: Diagnosis not present

## 2024-03-30 DIAGNOSIS — M25572 Pain in left ankle and joints of left foot: Secondary | ICD-10-CM | POA: Diagnosis not present

## 2024-04-05 DIAGNOSIS — M25372 Other instability, left ankle: Secondary | ICD-10-CM | POA: Diagnosis not present

## 2024-04-05 DIAGNOSIS — R2689 Other abnormalities of gait and mobility: Secondary | ICD-10-CM | POA: Diagnosis not present

## 2024-04-05 DIAGNOSIS — S93402S Sprain of unspecified ligament of left ankle, sequela: Secondary | ICD-10-CM | POA: Diagnosis not present

## 2024-04-08 DIAGNOSIS — R2689 Other abnormalities of gait and mobility: Secondary | ICD-10-CM | POA: Diagnosis not present

## 2024-04-08 DIAGNOSIS — M25372 Other instability, left ankle: Secondary | ICD-10-CM | POA: Diagnosis not present

## 2024-04-08 DIAGNOSIS — S93402S Sprain of unspecified ligament of left ankle, sequela: Secondary | ICD-10-CM | POA: Diagnosis not present

## 2024-04-12 DIAGNOSIS — M25372 Other instability, left ankle: Secondary | ICD-10-CM | POA: Diagnosis not present

## 2024-04-12 DIAGNOSIS — S93402S Sprain of unspecified ligament of left ankle, sequela: Secondary | ICD-10-CM | POA: Diagnosis not present

## 2024-04-12 DIAGNOSIS — R2689 Other abnormalities of gait and mobility: Secondary | ICD-10-CM | POA: Diagnosis not present

## 2024-04-14 DIAGNOSIS — R2689 Other abnormalities of gait and mobility: Secondary | ICD-10-CM | POA: Diagnosis not present

## 2024-04-14 DIAGNOSIS — S93402S Sprain of unspecified ligament of left ankle, sequela: Secondary | ICD-10-CM | POA: Diagnosis not present

## 2024-04-14 DIAGNOSIS — M25372 Other instability, left ankle: Secondary | ICD-10-CM | POA: Diagnosis not present

## 2024-04-19 DIAGNOSIS — R2689 Other abnormalities of gait and mobility: Secondary | ICD-10-CM | POA: Diagnosis not present

## 2024-04-19 DIAGNOSIS — S93402S Sprain of unspecified ligament of left ankle, sequela: Secondary | ICD-10-CM | POA: Diagnosis not present

## 2024-04-19 DIAGNOSIS — M25372 Other instability, left ankle: Secondary | ICD-10-CM | POA: Diagnosis not present

## 2024-04-21 DIAGNOSIS — R2689 Other abnormalities of gait and mobility: Secondary | ICD-10-CM | POA: Diagnosis not present

## 2024-04-21 DIAGNOSIS — M25372 Other instability, left ankle: Secondary | ICD-10-CM | POA: Diagnosis not present

## 2024-04-21 DIAGNOSIS — S93402S Sprain of unspecified ligament of left ankle, sequela: Secondary | ICD-10-CM | POA: Diagnosis not present

## 2024-04-25 DIAGNOSIS — S93402S Sprain of unspecified ligament of left ankle, sequela: Secondary | ICD-10-CM | POA: Diagnosis not present

## 2024-04-25 DIAGNOSIS — M25372 Other instability, left ankle: Secondary | ICD-10-CM | POA: Diagnosis not present

## 2024-04-25 DIAGNOSIS — R2689 Other abnormalities of gait and mobility: Secondary | ICD-10-CM | POA: Diagnosis not present

## 2024-04-28 DIAGNOSIS — S93402S Sprain of unspecified ligament of left ankle, sequela: Secondary | ICD-10-CM | POA: Diagnosis not present

## 2024-04-28 DIAGNOSIS — R2689 Other abnormalities of gait and mobility: Secondary | ICD-10-CM | POA: Diagnosis not present

## 2024-04-28 DIAGNOSIS — M25372 Other instability, left ankle: Secondary | ICD-10-CM | POA: Diagnosis not present

## 2024-05-19 ENCOUNTER — Ambulatory Visit: Admitting: Adult Health

## 2024-05-19 ENCOUNTER — Encounter: Payer: Self-pay | Admitting: Adult Health

## 2024-05-19 VITALS — BP 133/75 | HR 76

## 2024-05-19 DIAGNOSIS — F02C11 Dementia in other diseases classified elsewhere, severe, with agitation: Secondary | ICD-10-CM | POA: Diagnosis not present

## 2024-05-19 DIAGNOSIS — F419 Anxiety disorder, unspecified: Secondary | ICD-10-CM

## 2024-05-19 DIAGNOSIS — G301 Alzheimer's disease with late onset: Secondary | ICD-10-CM | POA: Diagnosis not present

## 2024-05-19 DIAGNOSIS — F32A Depression, unspecified: Secondary | ICD-10-CM

## 2024-05-19 MED ORDER — SERTRALINE HCL 50 MG PO TABS: 50.0000 mg | ORAL_TABLET | Freq: Every day | ORAL | 11 refills | Status: AC

## 2024-05-19 NOTE — Progress Notes (Signed)
 [facility]  PATIENT: Samantha Ali DOB: 1946/06/14  PRIMARY NEUROLOGIST: Dr. Margaret REFERRING CLINICIAN: Cleotilde Planas, MD HISTORY FROM: patient and husband REASON FOR VISIT: Memory loss   HISTORICAL  CHIEF COMPLAINT:  Chief Complaint  Patient presents with   Memory Loss    Rm 8 with spouse  Pt is well, spouse reports her memory has declined since last visit.     HISTORY OF PRESENT ILLNESS:   Update 05/19/2024 JM: Patient returns for 32-month follow-up visit accompanied by her husband who provides majority of history.  At prior visit, increased Exelon  patch to 9.4 mg daily.  Husband called office in June noting changes in mood with increased agitation, irritability as well as wandering.  Initiated Rexulti  0.5 mg daily.   Husband reports continued cognitive decline. Unable to complete MMSE today. She can have occasional issues with behaviors including agitation, paranoia, perseverative and wandering. She can easily become agitated and will bang her fists or head on a wall, husband is able to gradually redirect her and calm her down. Has had a couple instances of wandering such as taking the dog outside but ends up taking the dog for a walk without the husband knowing, will eventually find her walking around the neighborhood.  She is currently on sertraline  25 mg daily but does have symptoms of depression/anxiety.  Husband reported no benefit on Rexulti  after 82-month duration and did not request increased dose due to high cost ($500/month).  Continues on Exelon  patch and tolerating increased dose. She continues to maintain ADLs independently but more recently has been refusing to bathe.  Appetite can fluctuate, husband tries to cook good healthy meals.  Overall sleeping well, can occasionally wake in the middle of the night and wander around the house, thankfully she has not tried to leave the house.        History provided for reference purposes only Update  11/05/2023 JM: Patient returns for follow-up visit accompanied by her husband.  Reports overall cognition stable since prior visit. MMSE today 11/30 (prior 15/30).   Remains on Exelon  4.6mg  transdermal patch, tolerating well, some nights patient will refuse to allow husband to place new patch but eventually will let him.  Can have occasional agitation but otherwise no significant behavioral concerns.  She continues to hide objects around the home.  Reports she sleeps well.  Relatively good appetite.  No questions or concerns at this time.  Update 04/14/2023 JM: Patient returns for follow-up visit accompanied by her husband.  Started on Exelon  transdermal patches at prior visit.  Believes memory stable. Continues to maintain ADL's independently.  Husband assist with IADLs.  Denies behavioral concerns. Appetite has been good. Sleeping well, has not needed trazodone  (rx'd by PCP).  Tries to stay active at home but overall relatively sedentary.  No routine physical or mental activities.  MMSE today 15/30 (17/30). Is tearful today during visit due to continued memory difficulties and that fact that there is no cure.   Update 10/08/2022 JM: Returns for follow-up visit accompanied by her husband.  Was started on Namenda  at prior visit but had difficulty tolerating therefore discontinued.  Reports cognition has been relatively stable, has good days and bad days. Does get frustrated easily with memory loss, such as forgetting where she places items. Per husband, she will frequently misplace items such as her purse, checkbook, phone, etc and become very upset when she and her husband cannot locate these items. Husband has been trying to work on ensuring she  keeps these items in the same place over the past week.  She does admit to being sedentary, does not do any type of physical activity or mental stimulation. Sleeping well. Appetite good for the most part.  MMSE today 17/30 (prior 15/30), becomes tearful during memory  testing due to frustration with missing questions.   Update 06/02/2022 JM: Patient returns per request due to concerns of worsening memory accompanied by her husband. Feels like her memory has been a gradual decline since prior visit, can have good days and bad days.  More issues with short-term memory and at times delayed recall.  Husband believes memory has more acutely declined over the past 2 to 3 weeks.  Was recently seen by PCP, denies any work-up at that time for acute decline.  Has been experiencing increased urinary frequency over the past couple of weeks, denies any painful urination or bloody urine.  She is still able to maintain all of her ADLs, able to do light cleaning, husband assists with cooking and bill paying.  Husband questions use of medications, reports previously trying donepezil and had difficulty tolerating, denies previously trying memantine .   Consult visit 04/24/2020 Dr. Margaret: 78 year old female here for evaluation memory loss. Patient has had 6 months of mild short-term memory loss, losing things, forgetting recent conversations. No major changes in ADLs. She takes care of her own personal hygiene, bathing, dressing, feeding, household chores. She still able to drive, shop, manage finances and operate independently outside the home. She was empirically tried on donepezil and memantine  but could not tolerate these because of side effects.   REVIEW OF SYSTEMS: Full 14 system review of systems performed and negative with exception of: As per HPI.  ALLERGIES: Allergies  Allergen Reactions   Aricept [Donepezil]     Bad dreams   Nsaids     Perf duodenal ulcer    HOME MEDICATIONS: Outpatient Medications Prior to Visit  Medication Sig Dispense Refill   acetaminophen  (TYLENOL ) 500 MG tablet Take 2 tablets (1,000 mg total) by mouth every 6 (six) hours as needed for mild pain (pain score 1-3) or fever.     amLODipine  (NORVASC ) 5 MG tablet Take 1 tablet (5 mg total) by mouth  daily.     pantoprazole  (PROTONIX ) 40 MG tablet Take 1 tablet (40 mg total) by mouth 2 (two) times daily for 42 days, THEN 1 tablet (40 mg total) daily.     rivastigmine  (EXELON ) 9.5 mg/24hr Place 1 patch (9.5 mg total) onto the skin daily. 30 patch 12   Brexpiprazole  (REXULTI ) 0.5 MG TABS Take 1 tablet (0.5 mg total) by mouth daily. 30 tablet 11   sertraline  (ZOLOFT ) 25 MG tablet Take 25 mg by mouth daily.     Brexpiprazole  (REXULTI ) 0.5 MG TABS Take 1 tablet (0.5 mg total) by mouth daily. (Patient not taking: Reported on 05/19/2024) 30 tablet 11   feeding supplement (ENSURE ENLIVE / ENSURE PLUS) LIQD Take 237 mLs by mouth 2 (two) times daily between meals.     methocarbamol  (ROBAXIN ) 500 MG tablet Take 1 tablet (500 mg total) by mouth every 6 (six) hours as needed for muscle spasms. (Patient not taking: Reported on 05/19/2024)     methylPREDNISolone  (MEDROL  DOSEPAK) 4 MG TBPK tablet Follow package insert 21 each 0   oxyCODONE  (OXY IR/ROXICODONE ) 5 MG immediate release tablet Take 0.5-1 tablets (2.5-5 mg total) by mouth every 4 (four) hours as needed for moderate pain (pain score 4-6) or severe pain (pain score 7-10). (Patient  not taking: Reported on 05/19/2024) 30 tablet 0   Salicylic Acid 50 % SOLN Apply 1 application  topically See admin instructions. Apply topically to affected area(s) at bedtime (Patient not taking: Reported on 05/19/2024)     No facility-administered medications prior to visit.    PAST MEDICAL HISTORY: Past Medical History:  Diagnosis Date   Arthritis    R knee   Breast cancer (HCC) 2004   chemo   Cancer (HCC)    CKD (chronic kidney disease)    Clot 1996   in the right leg- in the muscle     DVT (deep venous thrombosis) (HCC) 1990's   probably related to positive phlebitis   Factor 5 Leiden mutation, heterozygous 11/21/2013   GERD (gastroesophageal reflux disease)    Headache(784.0)    migraines- most often 2-3 times /week    Heterozygous factor V Leiden  mutation 01/16/2014   Hyperlipidemia    Insomnia    Migraine    Osteoporosis    Photophobia of both eyes    unknown etiology    PAST SURGICAL HISTORY: Past Surgical History:  Procedure Laterality Date   BREAST LUMPECTOMY  02/21/2003   BREAST SURGERY  1987   right lumpectomy for benign disease   EYE SURGERY     cataracts removed -IOL   KNEE SURGERY     LAPAROSCOPY N/A 09/30/2023   Procedure: LAPAROSCOPIC [facility] OF [facility];  Surgeon: Lyndel Deward PARAS, MD;  Location: [facility] OR;  Service: General;  Laterality: N/A;   MASTECTOMY Left 2004   L parial mastectomy   SENTINEL LYMPH NODE BIOPSY  02/21/2003   SHOULDER SURGERY Right 03/2010   rotator cuff   TONSILLECTOMY     TOTAL KNEE ARTHROPLASTY Right 08/09/2013   Procedure: TOTAL KNEE ARTHROPLASTY;  Surgeon: Maude KANDICE Herald, MD;  Location: [facility] OR;  Service: Orthopedics;  Laterality: Right;   TUBAL LIGATION      FAMILY HISTORY: Family History  Problem Relation Age of Onset   Heart disease Father     SOCIAL HISTORY: Social History   Socioeconomic History   Marital status: Married    Spouse name: Lamar   Number of children: 1   Years of education: 12   Highest education level: Not on file  Occupational History   Occupation: collections  Tobacco Use   Smoking status: Never   Smokeless tobacco: Never  Substance and Sexual Activity   Alcohol use: Never    Alcohol/week: 0.0 standard drinks of alcohol   Drug use: Never   Sexual activity: Not on file  Other Topics Concern   Not on file  Social History Narrative   Lives with husband   No caffiene   Social Drivers of Corporate investment banker Strain: Not on file  Food Insecurity: No Food Insecurity (09/30/2023)   Hunger Vital Sign    Worried About Running Out of Food in the Last Year: Never true    Ran Out of Food in the Last Year: Never true  Transportation Needs: No Transportation Needs (09/30/2023)   PRAPARE - Administrator, Civil Service (Medical): No     Lack of Transportation (Non-Medical): No  Physical Activity: Not on file  Stress: Not on file  Social Connections: Unknown (09/30/2023)   Social Connection and Isolation Panel    Frequency of Communication with Friends and Family: Patient unable to answer    Frequency of Social Gatherings with Friends and Family: Patient unable to answer    Attends  Religious Services: Patient declined    Active Member of Clubs or Organizations: Patient unable to answer    Attends Club or Organization Meetings: Patient unable to answer    Marital Status: Patient unable to answer  Intimate Partner Violence: Not At Risk (09/30/2023)   Humiliation, Afraid, Rape, and Kick questionnaire    Fear of Current or Ex-Partner: No    Emotionally Abused: No    Physically Abused: No    Sexually Abused: No     PHYSICAL EXAM  GENERAL EXAM/CONSTITUTIONAL: Vitals:  Vitals:   05/19/24 1457  BP: 133/75  Pulse: 76    Patient is in no distress although depressed appearing; well developed, nourished and groomed; neck is supple   NEUROLOGIC: MENTAL STATUS:     05/19/2024    2:50 PM 11/05/2023   12:48 PM 04/14/2023    2:54 PM  MMSE - Mini Mental State Exam  Not completed: Unable to complete    Orientation to time 1 0 1  Orientation to Place 1 3 5   Registration  3 3  Attention/ Calculation  0 1  Recall  0 0  Language- name 2 objects  1 2  Language- repeat  1 1  Language- follow 3 step command  2 1  Language- read & follow direction  1 1  Write a sentence  0 0  Copy design  0 0  Total score  11 15   awake, alert recent memory impaired and remote memory intact Impaired attention and concentration, relies on husband to answer questions Word finding difficulty, limited conversation   CRANIAL NERVE:  2nd, 3rd, 4th, 6th - pupils equal and reactive to light, visual fields full to confrontation, extraocular muscles intact, no nystagmus 5th - facial sensation symmetric 7th - facial strength symmetric 8th -  hearing intact 9th - palate elevates symmetrically, uvula midline 11th - shoulder shrug symmetric 12th - tongue protrusion midline  MOTOR:  normal bulk and tone, full strength in the BUE, BLE  SENSORY:  normal and symmetric to light touch, temperature, vibration  COORDINATION:  finger-nose-finger, fine finger movements normal  REFLEXES:  deep tendon reflexes present and symmetric  GAIT/STATION:  narrow based gait, no use of AD      DIAGNOSTIC DATA (LABS, IMAGING, TESTING) - I reviewed patient records, labs, notes, testing and imaging myself where available.  Lab Results  Component Value Date   WBC 9.9 10/05/2023   HGB 10.8 (L) 10/05/2023   HCT 32.6 (L) 10/05/2023   MCV 92.6 10/05/2023   PLT 277 10/05/2023      Component Value Date/Time   NA 138 10/05/2023 0727   NA 142 06/02/2022 1351   NA 143 05/30/2013 1040   K 3.9 10/05/2023 0727   K 4.6 05/30/2013 1040   CL 106 10/05/2023 0727   CL 105 05/05/2012 1058   CO2 26 10/05/2023 0727   CO2 27 05/30/2013 1040   GLUCOSE 96 10/05/2023 0727   GLUCOSE 98 05/30/2013 1040   GLUCOSE 93 05/05/2012 1058   BUN 21 10/05/2023 0727   BUN 9 06/02/2022 1351   BUN 14.4 05/30/2013 1040   CREATININE 1.03 (H) 10/05/2023 0727   CREATININE 1.1 05/30/2013 1040   CALCIUM  8.8 (L) 10/05/2023 0727   CALCIUM  10.1 05/30/2013 1040   PROT 5.6 (L) 10/01/2023 0535   PROT 7.1 06/02/2022 1351   PROT 7.7 05/30/2013 1040   ALBUMIN  2.8 (L) 10/01/2023 0535   ALBUMIN  4.8 06/02/2022 1351   ALBUMIN  3.8 05/30/2013 1040  AST 16 10/01/2023 0535   AST 26 05/30/2013 1040   ALT 18 10/01/2023 0535   ALT 22 05/30/2013 1040   ALKPHOS 64 10/01/2023 0535   ALKPHOS 88 05/30/2013 1040   BILITOT 0.5 10/01/2023 0535   BILITOT 0.5 06/02/2022 1351   BILITOT 0.59 05/30/2013 1040   GFRNONAA 56 (L) 10/05/2023 0727   GFRAA 72 (L) 11/15/2013 0630   Lab Results  Component Value Date   CHOL 150 05/27/2010   HDL 50 05/27/2010   LDLCALC 87 05/27/2010   TRIG  56 09/30/2023   CHOLHDL 3.0 05/27/2010   Lab Results  Component Value Date   HGBA1C 5.6 10/02/2023   Lab Results  Component Value Date   VITAMINB12 820 06/02/2022   Lab Results  Component Value Date   TSH 2.020 06/02/2022     03/08/19 MRI brain - Mild biparietal atrophy.  - Otherwise normal.      ASSESSMENT AND PLAN  78 y.o. year old female here with mild memory loss since early 2021. No major changes in ADLs. Could represent mild cognitive impairment.   Dx:  1. Severe dementia with agitation, unspecified dementia type (HCC)       PLAN:  DEMENTIA WITH BEHAVIORS  - suspect Alzheimer's type based on gradual decline over the past several years -MMSE today unable to complete (prior 11/30) - Continue Exelon  to 9.5 mg transdermal patch daily - Increase sertraline  to 50 mg daily - can consider use of Seroquel in the future if behaviors worsen, discussed needing to consider risk vs  benefit, continue with redirection and trying to avoid triggers of agitation. Discussed looking into devices that can assist with tracking, will further look into this and provide husband with further information -Difficulty tolerating memantine  and donepezil. No benefit with Rexulti  -Discussed importance of increasing physical activity and mental stimulation and social activities, ensuring adequate sleep and healthy diet    Return in about 6 months (around 11/17/2024).   I personally spent a total of 40 minutes in the care of the patient today including preparing to see the patient, performing a medically appropriate exam/evaluation, counseling and educating, placing orders, and documenting clinical information in the EHR.   Harlene Bogaert, AGNP-BC  [facility] [address] [place], [place] [address]  Phone [phone] Fax [phone] Note: This document was prepared with digital dictation and possible smart phrase technology. Any transcriptional errors  that result from this process are unintentional.

## 2024-05-19 NOTE — Patient Instructions (Addendum)
 Your Plan:  Increase sertraline  to 50mg  nightly - hopefully this will further help with anxiety and agitation   If behaviors persist or worsen, please let me know  Continue Exelon  9.5mg  patch      Follow up in 6 months or call earlier if needed     Thank you for coming to see us  at [facility]. I hope we have been able to provide you high quality care today.  You may receive a patient satisfaction survey over the next few weeks. We would appreciate your feedback and comments so that we may continue to improve ourselves and the health of our patients.     Dementia Caregiver Guide Dementia is a condition that affects the way the brain works. It often affects thinking and memory. A person with dementia may: Forget things. Have trouble talking or responding to your questions. Have trouble paying attention. Have trouble thinking clearly and making good decisions. Get lost or wander away from home or other places. Have big changes in their mood or emotions. They may: Feel very worried, nervous, or depressed. Have angry outbursts. Be suspicious or accuse you of things. Have childlike behavior and language. Taking care of someone with dementia can be a challenge. The tips below can help you care for the person. How to help manage lifestyle changes Dementia usually gets worse slowly over time. In the early stages, people with dementia can stay safe and take care of themselves with some help. In later stages, they need help with daily tasks like getting dressed, grooming, and going to the bathroom. Communicating When the person is talking and seems frustrated, make eye contact and hold the person's hand. Ask questions that can be answered with a yes or no. Use simple words and a calm voice. Only give one direction at a time. Limit choices for the person. Too many choices can be stressful. Avoid correcting the person in a negative way. If the person can't find the  right words, gently try to help. Preventing injury  Keep floors clear. Remove rugs, magazine racks, and floor lamps. Keep hallways well lit, especially at night. Put a handrail and nonslip mat in the bathtub or shower. Put childproof locks on cabinets that have dangerous items in them. These items include medicine, alcohol, guns, cleaning products, and sharp tools. For doors to the outside, put locks where the person can't see or reach them. This helps keep the person from going out of the house and getting lost. Be ready for emergencies. Keep a list of emergency phone numbers and addresses close by. Remove car keys and lock garage doors so the person doesn't try to drive. Have the person wear a bracelet that tracks where they are and shows that they're a person with memory loss. This should be worn at all times for safety. Helping with daily life  Keep the person on track with their daily routine. Try to identify areas where the person may need help. Be supportive, patient, calm, and encouraging. Gently remind the person that adjusting to changes takes time. Help with the tasks that the person has asked for help with. Keep the person involved in daily tasks and decisions as much as you can. Encourage conversation, but try not to get frustrated if the person struggles to find words or doesn't seem to appreciate your help. Other tips Think about any safety risks and take steps to avoid them. Keep things organized: Organize medicines in a pill box for each day of  the week. Keep a calendar in a central place. Use it to remind the person of health care visits or other activities. Create a plan to handle any legal or financial matters. Get help from a professional if needed. Help make sure the person: Takes medicines only as told by their health care providers. Eats regular, healthy meals. They should also drink plenty of fluids. Goes to all scheduled health care appointments. Gets regular  sleep. Taking care of yourself Being a caregiver for someone with dementia can be hard. You may feel stressed and have many other emotions. It's important to also take care of yourself. Here are some tips: Find out about services that can provide short-term care for the person. This is called respite care. It can allow you to take a break when you need one. Find healthy ways to deal with stress. Some ways include: Spending time with other people. Exercising. Meditating or doing deep breathing exercises. Take care of your own health by: Getting enough sleep. Eating healthy foods. Getting regular exercise. Join a support group with others who are caregivers. These groups can help you: Learn other ways to deal with stress. Share experiences with others. Get emotional comfort and support. Learn about caregiving as the disease gets worse. Find resources in your community. Where to find support: Many people and organizations offer support. These include: Support groups for people with dementia. Support groups for caregivers. Counselors or therapists. Home health care services. Adult day care centers. Where to find more information Alzheimer's Association: Western[employer] Family Caregiver Alliance: caregiver.org Alzheimer's Foundation of [place]: alzfdn.org Contact a health care provider if: The person's health is quickly getting worse. You're no longer able to care for the person. Caring for the person is affecting your physical and emotional health. You're feeling worried, nervous, or depressed about caring for the person. Get help right away if: You feel like the person may hurt themselves or others. The person has talked about taking their own life. These symptoms may be an emergency. Take one of these steps right away: Go to your nearest emergency room. Call 911. Call the National Suicide Prevention Lifeline at 817-052-2627 or 988. Text the Crisis Text Line at 838-422-0447. This  information is not intended to replace advice given to you by your health care provider. Make sure you discuss any questions you have with your health care provider. Document Revised: 10/24/2022 Document Reviewed: 10/24/2022 Elsevier Patient Education  2024 [employer].

## 2024-05-30 ENCOUNTER — Telehealth: Payer: Self-pay | Admitting: Adult Health

## 2024-05-30 NOTE — Telephone Encounter (Signed)
 Returned call to husband of pt.  LMVM to return call.

## 2024-05-30 NOTE — Telephone Encounter (Signed)
 Pt husband called to request to speak to nurse about Guidance . Pt has currently walk ou to of home   and went down rthe street Pt husband tried to get  Pt to come back in house PT DID NOT pt is currently at neighbors home and waiting on Police . Pt husband would like to speak to nurse to see if anyone can hep pt get emergency living assistance

## 2024-05-31 NOTE — Telephone Encounter (Signed)
 Can consider trial of low dose Seroquel as needed if behaviors persist. We discussed risk vs benefit with these medications at recent visit. If non pharmacological measures are exhausted and there is a safety concern for the patient, further pharmacological measures may need to be implemented.  Unfortunately, we are not able to assist with emergency living assistance but if there are significant safety concerns for either the patient or the husband, do not hesitate to call 911 for further assistance. If behaviors are significant enough, may have to be brought to ED for further assistance and they can also help with placement.

## 2024-05-31 NOTE — Telephone Encounter (Signed)
 I called and LMVM for husband relating to some recommendations from [place], NP .  Call back when available.

## 2024-05-31 NOTE — Telephone Encounter (Signed)
 I called husband of pt.  He relayed that last Thursday (lunch at [employer]) pt had meltdown.  Took 40 min to settle down.  Then yesterday was agitated, pt went for a walk , he followed her, she went to neighbors, police called.  She has been quiet since then.  He has toured memory care facility and no availability, has another 2 to tour.  He had given her increased dose setraline 50mg  but was not sure if this causing her outbursts.  He had decreased, but will go back up to 50mg  daily.  Rexulti  did not help after 2 months.  He just wanted Harlene NP to know.  Has appt with pcp next month.

## 2024-05-31 NOTE — Telephone Encounter (Signed)
 Patient's husband returned phone call and would like a call back,

## 2024-06-01 MED ORDER — QUETIAPINE FUMARATE 25 MG PO TABS: 12.5000 mg | ORAL_TABLET | Freq: Every evening | ORAL | 5 refills | Status: AC | PRN

## 2024-06-01 NOTE — Telephone Encounter (Signed)
 Called husband back at [phone]. Relayed message from Miles. He verbalized understanding.

## 2024-06-01 NOTE — Telephone Encounter (Signed)
 Harlene- can you send in Seroquel? Thank you  Called husband at [phone]. Relayed Jessica's message. He is interested in having her try Seroquel. Aware I will route to Harlene to call into [facility] and will call back to go over directions with him.   She did okay last night and slept ok. She was just getting up when I called. They are still actively searching for assisted living places.

## 2024-06-01 NOTE — Telephone Encounter (Signed)
 Prescription sent to [facility].  Recommend starting with half tab initially to ensure tolerance, can cause somnolence, dizziness, dry mouth, constipation and fatigue. If tolerates half tablet okay but no benefit, can increase to full tab.

## 2024-06-06 ENCOUNTER — Telehealth: Payer: Self-pay

## 2024-06-06 ENCOUNTER — Other Ambulatory Visit (HOSPITAL_COMMUNITY): Payer: Self-pay

## 2024-06-06 NOTE — Telephone Encounter (Signed)
 Pharmacy Patient Advocate Encounter   Received notification from Fax that prior authorization for Quetiapine 25mg  tablets is required/requested.   Insurance verification completed.   The patient is insured through [facility].   Per test claim: PA required; PA started via CoverMyMeds. KEY [id number] . Waiting for clinical questions to populate.

## 2024-06-06 NOTE — Telephone Encounter (Signed)
 Barbara  from [facility] called to confirm that Pt PA FOR  QUEtiapine (SEROQUEL) 25 MG tablet was Approved  06/06/24-06/06/25   [facility] callback is [phone] opt 5

## 2024-06-06 NOTE — Telephone Encounter (Signed)
 Clinical questions have been answered and PA submitted. PA currently Pending. Please be advised that most companies allow up to 30 days to make a decision. We will advise when a determination has been made, or follow up in 1 week.   Please reach out to our team, Rx Prior Auth Pool, if you haven't heard back in a week.

## 2024-06-07 ENCOUNTER — Other Ambulatory Visit (HOSPITAL_COMMUNITY): Payer: Self-pay

## 2024-06-07 NOTE — Telephone Encounter (Signed)
 Pharmacy Patient Advocate Encounter  Received notification from [facility] that Prior Authorization for Quetiapine has been APPROVED from 06/06/2024 to 06/06/2025. Ran test claim, Copay is $3.46. This test claim was processed through [facility]- copay amounts may vary at other pharmacies due to pharmacy/plan contracts, or as the patient moves through the different stages of their insurance plan.   PA #/Case ID/Reference #: [id number]

## 2024-06-16 ENCOUNTER — Telehealth: Payer: Self-pay

## 2024-06-16 DIAGNOSIS — R413 Other amnesia: Secondary | ICD-10-CM

## 2024-07-01 DIAGNOSIS — F0390 Unspecified dementia without behavioral disturbance: Secondary | ICD-10-CM | POA: Diagnosis not present

## 2024-07-01 DIAGNOSIS — I129 Hypertensive chronic kidney disease with stage 1 through stage 4 chronic kidney disease, or unspecified chronic kidney disease: Secondary | ICD-10-CM | POA: Diagnosis not present

## 2024-07-01 DIAGNOSIS — N1831 Chronic kidney disease, stage 3a: Secondary | ICD-10-CM | POA: Diagnosis not present

## 2024-07-01 DIAGNOSIS — Z111 Encounter for screening for respiratory tuberculosis: Secondary | ICD-10-CM | POA: Diagnosis not present

## 2024-07-01 DIAGNOSIS — R634 Abnormal weight loss: Secondary | ICD-10-CM | POA: Diagnosis not present

## 2024-07-05 ENCOUNTER — Telehealth: Payer: Self-pay

## 2024-07-05 NOTE — Progress Notes (Unsigned)
 Complex Care Management Note Care Guide Note  07/05/2024 Name: Samantha Ali MRN: [id number] DOB: 1946/01/22   Complex Care Management Outreach Attempts: An unsuccessful telephone outreach was attempted today to offer the patient information about available complex care management services.  Follow Up Plan:  Additional outreach attempts will be made to offer the patient complex care management information and services.   Encounter Outcome:  No Answer  .Debbe Fuse [facility], [facility] Guide  Direct Dial: [phone]  Fax [phone]

## 2024-07-06 NOTE — Progress Notes (Signed)
 Complex Care Management Note Care Guide Note  07/06/2024 Name: Samantha Ali MRN: [id number] DOB: 09-28-1945   Complex Care Management Outreach Attempts: A second unsuccessful outreach was attempted today to offer the patient with information about available complex care management services.  Follow Up Plan:  Additional outreach attempts will be made to offer the patient complex care management information and services.   Encounter Outcome:  No Answer  .Debbe Fuse [facility], [facility] Guide  Direct Dial: [phone]  Fax [phone]

## 2024-07-07 NOTE — Progress Notes (Signed)
 Complex Care Management Note Care Guide Note  07/07/2024 Name: Samantha Ali MRN: [id number] DOB: 28-Sep-1945   Complex Care Management Outreach Attempts: A third unsuccessful outreach was attempted today to offer the patient with information about available complex care management services.  Follow Up Plan:  No further outreach attempts will be made at this time. We have been unable to contact the patient to offer or enroll patient in complex care management services.  Encounter Outcome:  No Answer  .Debbe Fuse [facility], [facility] Guide  Direct Dial: [phone]  Fax [phone]

## 2024-12-13 ENCOUNTER — Ambulatory Visit: Admitting: Adult Health
